# Patient Record
Sex: Female | Born: 1950
Health system: Southern US, Community
[De-identification: ages and names within clinical notes are randomized; demographics above are authoritative.]

## PROBLEM LIST (undated history)

## (undated) DIAGNOSIS — Z8249 Family history of ischemic heart disease and other diseases of the circulatory system: Secondary | ICD-10-CM

## (undated) DIAGNOSIS — I509 Heart failure, unspecified: Secondary | ICD-10-CM

## (undated) DIAGNOSIS — C801 Malignant (primary) neoplasm, unspecified: Secondary | ICD-10-CM

## (undated) DIAGNOSIS — G709 Myoneural disorder, unspecified: Secondary | ICD-10-CM

## (undated) DIAGNOSIS — I2510 Atherosclerotic heart disease of native coronary artery without angina pectoris: Secondary | ICD-10-CM

## (undated) DIAGNOSIS — E039 Hypothyroidism, unspecified: Secondary | ICD-10-CM

## (undated) DIAGNOSIS — R112 Nausea with vomiting, unspecified: Secondary | ICD-10-CM

## (undated) DIAGNOSIS — G20A1 Parkinson's disease without dyskinesia, without mention of fluctuations: Secondary | ICD-10-CM

## (undated) DIAGNOSIS — I10 Essential (primary) hypertension: Secondary | ICD-10-CM

## (undated) DIAGNOSIS — E785 Hyperlipidemia, unspecified: Secondary | ICD-10-CM

## (undated) DIAGNOSIS — Z9889 Other specified postprocedural states: Secondary | ICD-10-CM

## (undated) DIAGNOSIS — M1990 Unspecified osteoarthritis, unspecified site: Secondary | ICD-10-CM

## (undated) DIAGNOSIS — F419 Anxiety disorder, unspecified: Secondary | ICD-10-CM

## (undated) DIAGNOSIS — I219 Acute myocardial infarction, unspecified: Secondary | ICD-10-CM

## (undated) HISTORY — DX: Family history of ischemic heart disease and other diseases of the circulatory system: Z82.49

## (undated) HISTORY — DX: Hyperlipidemia, unspecified: E78.5

## (undated) HISTORY — PX: OTHER SURGICAL HISTORY: SHX169

## (undated) HISTORY — PX: ABDOMINAL HYSTERECTOMY: SHX81

## (undated) HISTORY — DX: Essential (primary) hypertension: I10

## (undated) HISTORY — PX: CATARACT EXTRACTION, BILATERAL: SHX1313

## (undated) HISTORY — DX: Atherosclerotic heart disease of native coronary artery without angina pectoris: I25.10

## (undated) HISTORY — PX: KNEE ARTHROSCOPY W/ DEBRIDEMENT: SHX1867

---

## 1998-08-24 ENCOUNTER — Other Ambulatory Visit: Admission: RE | Admit: 1998-08-24 | Discharge: 1998-08-24 | Payer: Self-pay | Admitting: Obstetrics and Gynecology

## 1999-08-31 ENCOUNTER — Other Ambulatory Visit: Admission: RE | Admit: 1999-08-31 | Discharge: 1999-08-31 | Payer: Self-pay | Admitting: Obstetrics and Gynecology

## 2003-07-29 ENCOUNTER — Ambulatory Visit (HOSPITAL_COMMUNITY): Admission: RE | Admit: 2003-07-29 | Discharge: 2003-07-29 | Payer: Self-pay | Admitting: Orthopedic Surgery

## 2004-10-17 ENCOUNTER — Encounter (INDEPENDENT_AMBULATORY_CARE_PROVIDER_SITE_OTHER): Payer: Self-pay | Admitting: *Deleted

## 2004-10-17 ENCOUNTER — Ambulatory Visit (HOSPITAL_COMMUNITY): Admission: RE | Admit: 2004-10-17 | Discharge: 2004-10-17 | Payer: Self-pay | Admitting: General Surgery

## 2004-10-17 ENCOUNTER — Ambulatory Visit (HOSPITAL_BASED_OUTPATIENT_CLINIC_OR_DEPARTMENT_OTHER): Admission: RE | Admit: 2004-10-17 | Discharge: 2004-10-17 | Payer: Self-pay | Admitting: General Surgery

## 2007-12-31 ENCOUNTER — Other Ambulatory Visit: Admission: RE | Admit: 2007-12-31 | Discharge: 2007-12-31 | Payer: Self-pay | Admitting: Family Medicine

## 2010-10-22 ENCOUNTER — Inpatient Hospital Stay (HOSPITAL_COMMUNITY)
Admission: EM | Admit: 2010-10-22 | Discharge: 2010-10-25 | Payer: Self-pay | Source: Home / Self Care | Attending: Cardiovascular Disease | Admitting: Cardiovascular Disease

## 2010-10-23 ENCOUNTER — Encounter (INDEPENDENT_AMBULATORY_CARE_PROVIDER_SITE_OTHER): Payer: Self-pay | Admitting: Cardiovascular Disease

## 2010-10-23 HISTORY — PX: TRANSTHORACIC ECHOCARDIOGRAM: SHX275

## 2010-10-24 HISTORY — PX: CORONARY ANGIOPLASTY WITH STENT PLACEMENT: SHX49

## 2010-10-25 LAB — CBC
HCT: 35 % — ABNORMAL LOW (ref 36.0–46.0)
Hemoglobin: 11.5 g/dL — ABNORMAL LOW (ref 12.0–15.0)
MCH: 28.6 pg (ref 26.0–34.0)
MCHC: 32.9 g/dL (ref 30.0–36.0)
MCV: 87.1 fL (ref 78.0–100.0)
Platelets: 287 10*3/uL (ref 150–400)
RBC: 4.02 MIL/uL (ref 3.87–5.11)
RDW: 12.6 % (ref 11.5–15.5)
WBC: 9 10*3/uL (ref 4.0–10.5)

## 2010-10-25 LAB — BASIC METABOLIC PANEL
BUN: 10 mg/dL (ref 6–23)
CO2: 24 mEq/L (ref 19–32)
Calcium: 8.6 mg/dL (ref 8.4–10.5)
Chloride: 112 mEq/L (ref 96–112)
Creatinine, Ser: 0.86 mg/dL (ref 0.4–1.2)
GFR calc Af Amer: >60 mL/min (ref >60)
GFR calc non Af Amer: >60 mL/min (ref >60)
Glucose, Bld: 104 mg/dL — ABNORMAL HIGH (ref 70–99)
Potassium: 4.1 mEq/L (ref 3.5–5.1)
Sodium: 140 mEq/L (ref 135–145)

## 2010-12-27 HISTORY — PX: NM MYOCAR PERF WALL MOTION: HXRAD629

## 2011-01-01 LAB — COMPREHENSIVE METABOLIC PANEL
ALT: 16 U/L (ref 0–35)
ALT: 17 U/L (ref 0–35)
AST: 26 U/L (ref 0–37)
AST: 26 U/L (ref 0–37)
Albumin: 3.9 g/dL (ref 3.5–5.2)
Albumin: 4.1 g/dL (ref 3.5–5.2)
Alkaline Phosphatase: 71 U/L (ref 39–117)
Alkaline Phosphatase: 76 U/L (ref 39–117)
BUN: 10 mg/dL (ref 6–23)
BUN: 15 mg/dL (ref 6–23)
CO2: 26 mEq/L (ref 19–32)
CO2: 27 mEq/L (ref 19–32)
Calcium: 10.5 mg/dL (ref 8.4–10.5)
Calcium: 9.2 mg/dL (ref 8.4–10.5)
Chloride: 102 mEq/L (ref 96–112)
Chloride: 104 mEq/L (ref 96–112)
Creatinine, Ser: 0.75 mg/dL (ref 0.4–1.2)
Creatinine, Ser: 0.86 mg/dL (ref 0.4–1.2)
GFR calc Af Amer: >60 mL/min (ref >60)
GFR calc Af Amer: >60 mL/min (ref >60)
GFR calc non Af Amer: >60 mL/min (ref >60)
GFR calc non Af Amer: >60 mL/min (ref >60)
Glucose, Bld: 119 mg/dL — ABNORMAL HIGH (ref 70–99)
Glucose, Bld: 121 mg/dL — ABNORMAL HIGH (ref 70–99)
Potassium: 4.1 mEq/L (ref 3.5–5.1)
Potassium: 4.1 mEq/L (ref 3.5–5.1)
Sodium: 138 mEq/L (ref 135–145)
Sodium: 139 mEq/L (ref 135–145)
Total Bilirubin: 0.5 mg/dL (ref 0.3–1.2)
Total Bilirubin: 0.7 mg/dL (ref 0.3–1.2)
Total Protein: 7.7 g/dL (ref 6.0–8.3)
Total Protein: 7.8 g/dL (ref 6.0–8.3)

## 2011-01-01 LAB — CK TOTAL AND CKMB (NOT AT ARMC)
CK, MB: 14.4 ng/mL (ref 0.3–4.0)
Relative Index: 9.1 — ABNORMAL HIGH (ref 0.0–2.5)
Total CK: 159 U/L (ref 7–177)

## 2011-01-01 LAB — CARDIAC PANEL(CRET KIN+CKTOT+MB+TROPI)
CK, MB: 19.3 ng/mL (ref 0.3–4.0)
Relative Index: 10.1 — ABNORMAL HIGH (ref 0.0–2.5)
Total CK: 192 U/L — ABNORMAL HIGH (ref 7–177)
Total CK: 196 U/L — ABNORMAL HIGH (ref 7–177)
Troponin I: 0.83 ng/mL (ref 0.00–0.06)

## 2011-01-01 LAB — POCT CARDIAC MARKERS
CKMB, poc: 2.8 ng/mL (ref 1.0–8.0)
CKMB, poc: 3.4 ng/mL (ref 1.0–8.0)
Myoglobin, poc: 104 ng/mL (ref 12–200)
Myoglobin, poc: 120 ng/mL (ref 12–200)
Troponin i, poc: 0.11 ng/mL — ABNORMAL HIGH (ref 0.00–0.09)
Troponin i, poc: 0.12 ng/mL — ABNORMAL HIGH (ref 0.00–0.09)

## 2011-01-01 LAB — TROPONIN I: Troponin I: 0.73 ng/mL (ref 0.00–0.06)

## 2011-01-01 LAB — CBC
HCT: 38.2 % (ref 36.0–46.0)
HCT: 40.1 % (ref 36.0–46.0)
HCT: 41.4 % (ref 36.0–46.0)
Hemoglobin: 12.9 g/dL (ref 12.0–15.0)
Hemoglobin: 13.5 g/dL (ref 12.0–15.0)
Hemoglobin: 14.1 g/dL (ref 12.0–15.0)
MCH: 29.3 pg (ref 26.0–34.0)
MCH: 29.5 pg (ref 26.0–34.0)
MCH: 29.5 pg (ref 26.0–34.0)
MCHC: 33.7 g/dL (ref 30.0–36.0)
MCHC: 33.8 g/dL (ref 30.0–36.0)
MCHC: 34.1 g/dL (ref 30.0–36.0)
MCV: 86.6 fL (ref 78.0–100.0)
MCV: 87 fL (ref 78.0–100.0)
Platelets: 388 10*3/uL (ref 150–400)
Platelets: 395 10*3/uL (ref 150–400)
RBC: 4.61 MIL/uL (ref 3.87–5.11)
RBC: 4.78 MIL/uL (ref 3.87–5.11)
RDW: 12.7 % (ref 11.5–15.5)
RDW: 12.7 % (ref 11.5–15.5)
WBC: 7.9 10*3/uL (ref 4.0–10.5)
WBC: 8.9 10*3/uL (ref 4.0–10.5)

## 2011-01-01 LAB — HEMOGLOBIN A1C
Hgb A1c MFr Bld: 5.5 % (ref <5.7)
Mean Plasma Glucose: 111 mg/dL (ref <117)

## 2011-01-01 LAB — BASIC METABOLIC PANEL
CO2: 25 mEq/L (ref 19–32)
Chloride: 107 mEq/L (ref 96–112)
Glucose, Bld: 114 mg/dL — ABNORMAL HIGH (ref 70–99)
Potassium: 4.2 mEq/L (ref 3.5–5.1)
Sodium: 138 mEq/L (ref 135–145)

## 2011-01-01 LAB — MAGNESIUM: Magnesium: 2.2 mg/dL (ref 1.5–2.5)

## 2011-01-01 LAB — LIPID PANEL
Cholesterol: 199 mg/dL (ref 0–200)
HDL: 45 mg/dL (ref >39)
LDL Cholesterol: 108 mg/dL — ABNORMAL HIGH (ref 0–99)
Total CHOL/HDL Ratio: 4.4 RATIO
Triglycerides: 230 mg/dL — ABNORMAL HIGH (ref <150)
VLDL: 46 mg/dL — ABNORMAL HIGH (ref 0–40)

## 2011-01-01 LAB — HEPARIN LEVEL (UNFRACTIONATED)
Heparin Unfractionated: 0.18 IU/mL — ABNORMAL LOW (ref 0.30–0.70)
Heparin Unfractionated: 0.38 IU/mL (ref 0.30–0.70)

## 2011-01-01 LAB — PROTIME-INR
INR: 1.06 (ref 0.00–1.49)
Prothrombin Time: 14 seconds (ref 11.6–15.2)

## 2011-01-01 LAB — TSH: TSH: 2.765 u[IU]/mL (ref 0.350–4.500)

## 2011-01-01 LAB — GLUCOSE, CAPILLARY: Glucose-Capillary: 152 mg/dL — ABNORMAL HIGH (ref 70–99)

## 2011-01-01 LAB — MRSA PCR SCREENING: MRSA by PCR: NEGATIVE

## 2011-01-01 LAB — BRAIN NATRIURETIC PEPTIDE: Pro B Natriuretic peptide (BNP): <30 pg/mL (ref 0.0–100.0)

## 2011-01-01 LAB — APTT: aPTT: 40 seconds — ABNORMAL HIGH (ref 24–37)

## 2011-03-09 NOTE — Op Note (Signed)
NAME:  [name], Julie Carey NO.:  [id number]   MEDICAL RECORD NO.:  [id number]          PATIENT TYPE:  AMB   LOCATION:  DSC                          FACILITY:  [facility]   PHYSICIAN:  Rose Phi. Maple Hudson, M.D.   DATE OF BIRTH:  Oct 30, 1950   DATE OF PROCEDURE:  10/17/2004  DATE OF DISCHARGE:                                 OPERATIVE REPORT   PREOPERATIVE DIAGNOSIS:  T1a melanoma of the left lower calf.   POSTOPERATIVE DIAGNOSIS:  T1a melanoma of the left lower calf.   OPERATION/PROCEDURE:  Excision with primary closure.   SURGEON:  Dr. Francina Ames.   ANESTHESIA:  Local.   OPERATIVE PROCEDURE:  The patient was placed in the prone position and the  left lower calf prepped and draped in the usual fashion.  The lesion was  just above the Achilles tendon.  I marked an elliptical incision,  longitudinally oriented, with a margin of 5 or 6 mm on either side of the  biopsy site.  The area was then infiltrated with 1% Xylocaine with  Adrenalin.  The incision was made and the area excised.  We had good  hemostasis.   This left a defect of 4 x 2.5 cm.   It was then closed in a single layer of interrupted nylon sutures.   Dressing applied and the patient transferred back to the recovery area and  then allowed to go home.      Pete   PRY/MEDQ  D:  10/17/2004  T:  10/17/2004  Job:  [id number]

## 2011-03-09 NOTE — Op Note (Signed)
NAME:  Julie Carey, Julie Carey                           ACCOUNT NO.:  [id number]   MEDICAL RECORD NO.:  [id number]                   PATIENT TYPE:  AMB   LOCATION:  DAY                                  FACILITY:  [facility]   PHYSICIAN:  Marlowe Kays, M.D.               DATE OF BIRTH:  25-Nov-1950   DATE OF PROCEDURE:  07/29/2003  DATE OF DISCHARGE:                                 OPERATIVE REPORT   PREOPERATIVE DIAGNOSIS:  Torn medial meniscus, right knee.   POSTOPERATIVE DIAGNOSES:  1. Torn medial meniscus.  2. 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  3. Extensive synovitis lateral compartment of the knee joint.  4. Two suprapatellar plica associated with chondromalacia grade 2/4 of the     patella.   OPERATION:  Right knee arthroscopy with 1) partial medial meniscectomy, 2)  shaving of the medial femoral condyle, 3) debridement of lateral compartment  of the knee joint, 4) excision of two suprapatellar plica and shaving of the  patella.   SURGEON:  Marlowe Kays, M.D.   ASSISTANT:  Nurse.   ANESTHESIA:  General.   PATHOLOGY AND JUSTIFICATION FOR PROCEDURE:  She has had a long standing  problem with pain in her right knee. Recent MRIs demonstrated the tear of  the posterior horn and body of the medial meniscus and consequently she is  here today for the above mentioned surgery.   DESCRIPTION OF PROCEDURE:  Satisfactory general anesthesia, pneumatic  tourniquet, thigh stabilizer, right knee was prepped with Duraprep, draped  in a sterile field. Superomedial saline inflow. First through an anterior  medial portal, the lateral compartment of the knee joint was evaluated. She  had a rather striking amount of synovitis present as well as several long  pedunculated lesions from the lateral ACL which were potentially an  entrapment problem. I resected the synovium with a 3.5 shaver and resected  the pedunculated lesions with a variety of baskets. The lateral meniscus was  a  little frayed but otherwise was intact. Her ACL was intact. I then looked  up in the lateral gutter and the suprapatellar area and found a large plica  at 10 o'clock and an even larger one at 2 o'clock. Most of this was  sectioned with scissors from this approach but I was unable to really remove  the remnants. I then reversed portals and through this approach was able to  remove the remnants of the plica and shape down the mid portion of the  patella which had grade 2/4 chondromalacia. I then looked in the medial  compartment of the knee joint, there was some moderate synovitis present  which I resected. She had some grade 2/4 chondromalacia over a good bit of  the weightbearing surface of the medial femoral condyle which I gently  debrided back with baskets and the 3.5 shaver. A tear perpendicular to the  meniscus was noted at the posterior  curve and in the intercondylar area. I  began resecting this with baskets and a 3.5 shaver and found that the whole  posterior portion of meniscus was unstable and could be brought into the  joint necessitating resection of this back to a stable rim with baskets and  shaver as well. Final pictures were taken. The knee joint was then irrigated  until clear and all fluid possible removed. The two anterior portals were  closed with 4-0 nylon, 20 mL of 0.5% Marcaine with adrenaline and 4 mg of  morphine were  then instilled through the inflap rasp which was removed and this portal  closed with 4-0 nylon as well. Betadine adaptic dry sterile dressing were  applied, tourniquet was released. She tolerated the procedure well and was  taken to the recovery room in satisfactory condition with no known  complications.                                               Marlowe Kays, M.D.    JA/MEDQ  D:  07/29/2003  T:  07/29/2003  Job:  [id number]

## 2013-03-30 ENCOUNTER — Other Ambulatory Visit: Payer: Self-pay | Admitting: *Deleted

## 2013-03-30 MED ORDER — PANTOPRAZOLE SODIUM 40 MG PO TBEC: 40.0000 mg | DELAYED_RELEASE_TABLET | Freq: Every day | ORAL | Status: DC

## 2013-03-31 ENCOUNTER — Other Ambulatory Visit: Payer: Self-pay | Admitting: *Deleted

## 2013-03-31 MED ORDER — PANTOPRAZOLE SODIUM 40 MG PO TBEC: 40.0000 mg | DELAYED_RELEASE_TABLET | Freq: Every day | ORAL | Status: DC

## 2013-04-06 ENCOUNTER — Other Ambulatory Visit (HOSPITAL_COMMUNITY): Payer: Self-pay | Admitting: Cardiovascular Disease

## 2013-05-28 ENCOUNTER — Other Ambulatory Visit: Payer: Self-pay | Admitting: Cardiology

## 2013-05-29 NOTE — Telephone Encounter (Signed)
Rx was sent to pharmacy electronically. 

## 2013-05-31 ENCOUNTER — Other Ambulatory Visit (HOSPITAL_COMMUNITY): Payer: Self-pay | Admitting: Cardiovascular Disease

## 2013-06-01 NOTE — Telephone Encounter (Signed)
Rx was sent to pharmacy electronically. 

## 2013-06-04 ENCOUNTER — Telehealth: Payer: Self-pay

## 2013-06-04 MED ORDER — LISINOPRIL 10 MG PO TABS: 10.0000 mg | ORAL_TABLET | Freq: Every day | ORAL | Status: DC

## 2013-06-04 NOTE — Telephone Encounter (Signed)
Received a prescribing error from pharmacy.  Called pt to inquire about Lisinopril dosage. JB increased Lisinopril from 5 mg to 10 mg in 08/2012.  Tried calling the pharmacy about the error twice, unable to speak with a pharmacist.  Rx was sent to pharmacy electronically, again.

## 2013-07-17 ENCOUNTER — Other Ambulatory Visit (HOSPITAL_COMMUNITY): Payer: Self-pay | Admitting: Cardiovascular Disease

## 2013-07-17 NOTE — Telephone Encounter (Signed)
Rx was sent to pharmacy electronically. 

## 2013-08-31 ENCOUNTER — Other Ambulatory Visit (HOSPITAL_COMMUNITY): Payer: Self-pay | Admitting: Cardiovascular Disease

## 2013-08-31 NOTE — Telephone Encounter (Signed)
Rx was sent to pharmacy electronically. 

## 2013-12-01 ENCOUNTER — Other Ambulatory Visit: Payer: Self-pay | Admitting: Cardiovascular Disease

## 2013-12-22 ENCOUNTER — Other Ambulatory Visit (HOSPITAL_COMMUNITY): Payer: Self-pay | Admitting: Cardiovascular Disease

## 2013-12-24 NOTE — Telephone Encounter (Signed)
,  Rx was sent to pharmacy electronically.

## 2013-12-24 NOTE — Telephone Encounter (Signed)
Pt calling again ,have not gotten her Simvastatin.

## 2014-01-18 ENCOUNTER — Other Ambulatory Visit (HOSPITAL_COMMUNITY): Payer: Self-pay | Admitting: Cardiovascular Disease

## 2014-01-20 NOTE — Telephone Encounter (Signed)
Rx was sent to pharmacy electronically. 

## 2014-01-20 NOTE — Telephone Encounter (Signed)
Pt was calling in regards to her Simvastatin needing to be refilled. Her next appointment is Aprl 29th with Dr. Gwenlyn Found. She stated that the pharmacy told her to call us to make an appointment because she has no more refills.  JB

## 2014-02-09 ENCOUNTER — Encounter: Payer: Self-pay | Admitting: *Deleted

## 2014-02-16 ENCOUNTER — Encounter: Payer: Self-pay | Admitting: Cardiovascular Disease

## 2014-02-17 ENCOUNTER — Encounter: Payer: Self-pay | Admitting: Cardiovascular Disease

## 2014-02-17 ENCOUNTER — Ambulatory Visit (INDEPENDENT_AMBULATORY_CARE_PROVIDER_SITE_OTHER): Payer: BC Managed Care – PPO | Admitting: Cardiovascular Disease

## 2014-02-17 VITALS — BP 120/78 | HR 75 | Ht 64.0 in | Wt 198.0 lb

## 2014-02-17 DIAGNOSIS — E785 Hyperlipidemia, unspecified: Secondary | ICD-10-CM

## 2014-02-17 DIAGNOSIS — E782 Mixed hyperlipidemia: Secondary | ICD-10-CM

## 2014-02-17 DIAGNOSIS — Z79899 Other long term (current) drug therapy: Secondary | ICD-10-CM

## 2014-02-17 DIAGNOSIS — I2510 Atherosclerotic heart disease of native coronary artery without angina pectoris: Secondary | ICD-10-CM

## 2014-02-17 DIAGNOSIS — I10 Essential (primary) hypertension: Secondary | ICD-10-CM | POA: Insufficient documentation

## 2014-02-17 NOTE — Assessment & Plan Note (Signed)
Controlled on current medications 

## 2014-02-17 NOTE — Progress Notes (Signed)
02/17/2014 Dede Query   1951/08/30  [id number]  Primary Physician Reginia Naas, MD Primary Cardiologist: Lorretta Harp MD [name]   HPI:  The patient returns today for 37-month followup. She is a 63 year old, mildly overweight, married Caucasian female, mother of 10, grandmother to 5 grandchildren who I saw August 29, 2012. She is 2 years status post non-STEMI and ultimate stenting of her AV groove circumflex by Dr. Ellouise Newer with a drug-eluting stent. She had moderate but noncritical disease in her LAD and RCA. Her risk factors include hyperlipidemia and family history. She had a negative Myoview December 27, 2010. She complains of labile hypertension and some palpitations; however, her blood pressure log over the last several months, after I increased her lisinopril, showed better control of her blood pressure. She did not increase her beta blocker as I had prescribed. Her lipid profile was excellent for secondary prevention and this will be rechecked. Since I saw her a year ago she denies chest pain or shortness of breath.     Current Outpatient Prescriptions  Medication Sig Dispense Refill  . aspirin 81 MG tablet Take 81 mg by mouth daily.      . clopidogrel (PLAVIX) 75 MG tablet TAKE 1 TABLET BY MOUTH EVERY DAY WITH A MEAL  30 tablet  3  . estrogens, conjugated, (PREMARIN) 0.625 MG tablet Take 0.625 mg by mouth daily. Take daily for 21 days then do not take for 7 days.      Marland Kitchen levothyroxine (SYNTHROID, LEVOTHROID) 75 MCG tablet Take 75 mcg by mouth daily before breakfast.      . lisinopril (PRINIVIL,ZESTRIL) 10 MG tablet TAKE 1 TABLET BY MOUTH DAILY  30 tablet  3  . metoprolol tartrate (LOPRESSOR) 25 MG tablet TAKE 1 TABLET BY MOUTH TWICE DAILY  60 tablet  3  . pantoprazole (PROTONIX) 40 MG tablet Take 1 tablet (40 mg total) by mouth daily.  30 tablet  11  . simvastatin (ZOCOR) 40 MG tablet TAKE 1 TABLET BY MOUTH DAILY  30 tablet  0   No current  facility-administered medications for this visit.    No Known Allergies  History   Social History  . Marital Status: Married    Spouse Name: N/A    Number of Children: N/A  . Years of Education: N/A   Occupational History  . Not on file.   Social History Main Topics  . Smoking status: Never Smoker   . Smokeless tobacco: Not on file  . Alcohol Use: Not on file  . Drug Use: Not on file  . Sexual Activity: Not on file   Other Topics Concern  . Not on file   Social History Narrative  . No narrative on file     Review of Systems: General: negative for chills, fever, night sweats or weight changes.  Cardiovascular: negative for chest pain, dyspnea on exertion, edema, orthopnea, palpitations, paroxysmal nocturnal dyspnea or shortness of breath Dermatological: negative for rash Respiratory: negative for cough or wheezing Urologic: negative for hematuria Abdominal: negative for nausea, vomiting, diarrhea, bright red blood per rectum, melena, or hematemesis Neurologic: negative for visual changes, syncope, or dizziness All other systems reviewed and are otherwise negative except as noted above.    Blood pressure 120/78, pulse 75, height 5\' 4"  (1.626 m), weight 198 lb (89.812 kg).  General appearance: alert and no distress Neck: no adenopathy, no carotid bruit, no JVD, supple, symmetrical, trachea midline and thyroid not enlarged, symmetric, no tenderness/mass/nodules  Lungs: clear to auscultation bilaterally Heart: regular rate and rhythm, S1, S2 normal, no murmur, click, rub or gallop Extremities: extremities normal, atraumatic, no cyanosis or edema  EKG normal sinus rhythm at 75 with a rightward axis  ASSESSMENT AND PLAN:   Hyperlipidemia On statin therapy. We will recheck a lipid and liver profile  Essential hypertension Controlled on current medications  Coronary artery disease History of non-STEMI status post circumflex intervention by Dr. Ellouise Newer 10/24/10 with a  Ms. Almond drug-eluting stent. Her LAD in RCA had moderate but not critical disease. Her EF was normal. She denies chest pain or shortness of breath. Myoview stress test performed a/7/12 was nonischemic.      Lorretta Harp MD FACP,FACC,FAHA, [facility] 02/17/2014 10:43 AM

## 2014-02-17 NOTE — Assessment & Plan Note (Signed)
On statin therapy. We will recheck a lipid and liver profile 

## 2014-02-17 NOTE — Assessment & Plan Note (Signed)
History of non-STEMI status post circumflex intervention by Dr. Ellouise Newer 10/24/10 with a Ms. Almond drug-eluting stent. Her LAD in RCA had moderate but not critical disease. Her EF was normal. She denies chest pain or shortness of breath. Myoview stress test performed a/7/12 was nonischemic.

## 2014-02-17 NOTE — Patient Instructions (Signed)
Your physician recommends that you schedule a follow-up appointment in: 1 year  Your physician recommends that you return for lab work in: Fasting Lipids Liver      

## 2014-02-26 ENCOUNTER — Other Ambulatory Visit (HOSPITAL_COMMUNITY): Payer: Self-pay | Admitting: Cardiovascular Disease

## 2014-02-26 NOTE — Telephone Encounter (Signed)
Rx refill sent to patient pharmacy   

## 2014-03-19 LAB — HEPATIC FUNCTION PANEL
ALBUMIN: 4.1 g/dL (ref 3.5–5.2)
ALK PHOS: 61 U/L (ref 39–117)
ALT: 14 U/L (ref 0–35)
AST: 17 U/L (ref 0–37)
BILIRUBIN TOTAL: 0.4 mg/dL (ref 0.2–1.2)
Bilirubin, Direct: 0.1 mg/dL (ref 0.0–0.3)
Indirect Bilirubin: 0.3 mg/dL (ref 0.2–1.2)
TOTAL PROTEIN: 6.6 g/dL (ref 6.0–8.3)

## 2014-03-19 LAB — LIPID PANEL
CHOLESTEROL: 141 mg/dL (ref 0–200)
HDL: 49 mg/dL (ref >39)
LDL Cholesterol: 63 mg/dL (ref 0–99)
Total CHOL/HDL Ratio: 2.9 Ratio
Triglycerides: 146 mg/dL (ref <150)
VLDL: 29 mg/dL (ref 0–40)

## 2014-03-23 ENCOUNTER — Encounter: Payer: Self-pay | Admitting: *Deleted

## 2014-04-02 ENCOUNTER — Other Ambulatory Visit: Payer: Self-pay | Admitting: Cardiovascular Disease

## 2014-04-02 ENCOUNTER — Other Ambulatory Visit: Payer: Self-pay | Admitting: *Deleted

## 2014-04-05 NOTE — Telephone Encounter (Signed)
Rx refill sent to patient pharmacy   

## 2014-05-04 ENCOUNTER — Other Ambulatory Visit: Payer: Self-pay | Admitting: Cardiovascular Disease

## 2014-05-13 ENCOUNTER — Telehealth: Payer: Self-pay | Admitting: Cardiovascular Disease

## 2014-05-13 NOTE — Telephone Encounter (Signed)
Julie Carey is calling because she wants to try samples of Instaflex. But needs to check with her MD before trying it . Want to try it for the advance joint support .Marland Kitchen Please call    Thanks

## 2014-05-13 NOTE — Telephone Encounter (Signed)
Returned call to patient she stated she wanted to ask Dr.Berry if ok to take Instaflex for joint support.Dr.Berry out of office will send message to him for advice.

## 2014-05-14 NOTE — Telephone Encounter (Signed)
Spoke with patient after reviewing med list (see Roanna Banning).  She is fine to try Instaflex.  She also asked about grapefruit, but because she is on a statin, she does need to avoid that.  Pt voiced understanding.

## 2014-05-14 NOTE — Telephone Encounter (Signed)
This patient goes by Baker Janus.  Her MRN is [id number]

## 2014-05-14 NOTE — Telephone Encounter (Signed)
Julie Carey, can you make sure that this is okay for patient to take? Thx!

## 2014-10-02 ENCOUNTER — Other Ambulatory Visit: Payer: Self-pay | Admitting: Cardiovascular Disease

## 2014-10-04 NOTE — Telephone Encounter (Signed)
Rx was sent to pharmacy electronically. 

## 2014-10-30 ENCOUNTER — Other Ambulatory Visit: Payer: Self-pay | Admitting: Cardiovascular Disease

## 2014-11-01 NOTE — Telephone Encounter (Signed)
Rx refill sent to patient pharmacy   

## 2015-01-01 ENCOUNTER — Other Ambulatory Visit: Payer: Self-pay | Admitting: Cardiovascular Disease

## 2015-01-03 NOTE — Telephone Encounter (Signed)
Rx refill sent to patient pharmacy   

## 2015-02-04 ENCOUNTER — Other Ambulatory Visit: Payer: Self-pay | Admitting: Cardiovascular Disease

## 2015-02-04 NOTE — Telephone Encounter (Signed)
Rx(s) sent to pharmacy electronically.  

## 2015-02-07 ENCOUNTER — Telehealth: Payer: Self-pay | Admitting: Cardiovascular Disease

## 2015-02-08 NOTE — Telephone Encounter (Signed)
Closed encounter °

## 2015-02-24 ENCOUNTER — Other Ambulatory Visit: Payer: Self-pay | Admitting: Cardiovascular Disease

## 2015-02-24 ENCOUNTER — Other Ambulatory Visit: Payer: Self-pay | Admitting: *Deleted

## 2015-02-24 MED ORDER — METOPROLOL TARTRATE 25 MG PO TABS: 25.0000 mg | ORAL_TABLET | Freq: Two times a day (BID) | ORAL | Status: DC

## 2015-02-24 MED ORDER — PANTOPRAZOLE SODIUM 40 MG PO TBEC: 40.0000 mg | DELAYED_RELEASE_TABLET | Freq: Every day | ORAL | Status: DC

## 2015-03-03 ENCOUNTER — Other Ambulatory Visit: Payer: Self-pay | Admitting: *Deleted

## 2015-03-03 ENCOUNTER — Other Ambulatory Visit: Payer: Self-pay | Admitting: Cardiovascular Disease

## 2015-03-04 NOTE — Telephone Encounter (Signed)
Rx(s) sent to pharmacy electronically.  

## 2015-03-21 ENCOUNTER — Other Ambulatory Visit: Payer: Self-pay | Admitting: Cardiovascular Disease

## 2015-03-22 NOTE — Telephone Encounter (Signed)
Rx(s) sent to pharmacy electronically.  

## 2015-04-04 ENCOUNTER — Other Ambulatory Visit: Payer: Self-pay | Admitting: Cardiovascular Disease

## 2015-04-20 ENCOUNTER — Other Ambulatory Visit: Payer: Self-pay | Admitting: Cardiovascular Disease

## 2015-04-21 NOTE — Telephone Encounter (Signed)
REFILL 

## 2015-04-26 ENCOUNTER — Ambulatory Visit (INDEPENDENT_AMBULATORY_CARE_PROVIDER_SITE_OTHER): Payer: BLUE CROSS/BLUE SHIELD | Admitting: Cardiovascular Disease

## 2015-04-26 ENCOUNTER — Encounter: Payer: Self-pay | Admitting: Cardiovascular Disease

## 2015-04-26 VITALS — BP 110/72 | HR 70 | Ht 64.0 in | Wt 202.4 lb

## 2015-04-26 DIAGNOSIS — E785 Hyperlipidemia, unspecified: Secondary | ICD-10-CM

## 2015-04-26 DIAGNOSIS — I2510 Atherosclerotic heart disease of native coronary artery without angina pectoris: Secondary | ICD-10-CM | POA: Diagnosis not present

## 2015-04-26 DIAGNOSIS — Z79899 Other long term (current) drug therapy: Secondary | ICD-10-CM | POA: Diagnosis not present

## 2015-04-26 DIAGNOSIS — I10 Essential (primary) hypertension: Secondary | ICD-10-CM

## 2015-04-26 MED ORDER — CLOPIDOGREL BISULFATE 75 MG PO TABS: 75.0000 mg | ORAL_TABLET | Freq: Every day | ORAL | Status: DC

## 2015-04-26 MED ORDER — LEVOTHYROXINE SODIUM 75 MCG PO TABS: 75.0000 ug | ORAL_TABLET | Freq: Every day | ORAL | Status: DC

## 2015-04-26 MED ORDER — SIMVASTATIN 40 MG PO TABS: 40.0000 mg | ORAL_TABLET | Freq: Every day | ORAL | Status: DC

## 2015-04-26 MED ORDER — LISINOPRIL 10 MG PO TABS: 10.0000 mg | ORAL_TABLET | Freq: Every day | ORAL | Status: DC

## 2015-04-26 MED ORDER — METOPROLOL TARTRATE 25 MG PO TABS: 25.0000 mg | ORAL_TABLET | Freq: Two times a day (BID) | ORAL | Status: DC

## 2015-04-26 MED ORDER — PANTOPRAZOLE SODIUM 40 MG PO TBEC: 40.0000 mg | DELAYED_RELEASE_TABLET | Freq: Every day | ORAL | Status: DC

## 2015-04-26 NOTE — Assessment & Plan Note (Signed)
History of coronary artery disease status post non-STEMI with AV groove circumflex stenting by Dr. Claiborne Billings in 2012. She had moderate but noncritical disease in her LAD and RCA. She did have a Myoview performed 12/27/10 which was nonischemic. She denies chest pain or shortness of breath.

## 2015-04-26 NOTE — Progress Notes (Signed)
04/26/2015 Julie Carey   Sep 06, 1951  [id number]  Primary Physician Julie Naas, MD Primary Cardiologist: Lorretta Harp MD Julie Carey   HPI:  The patient returns today for 1 year followup. She is a 64 year old, mildly overweight, married Caucasian female, mother of 59, grandmother to 5 grandchildren who I saw 02/17/14.. She is 2 years status post non-STEMI and ultimate stenting of her AV groove circumflex by Dr. Ellouise Newer with a drug-eluting stent. She had moderate but noncritical disease in her LAD and RCA. Her risk factors include hyperlipidemia and family history. She had a negative Myoview December 27, 2010. She complains of labile hypertension and some palpitations; however, her blood pressure log over the last several months, after I increased her lisinopril, showed better control of her blood pressure. She did not increase her beta blocker as I had prescribed. Her lipid profile was excellent for secondary prevention and this will be rechecked. Since I saw her a year ago she denies chest pain or shortness of breath.   Current Outpatient Prescriptions  Medication Sig Dispense Refill  . aspirin 81 MG tablet Take 81 mg by mouth 2 (two) times daily.     . clopidogrel (PLAVIX) 75 MG tablet Take 1 tablet (75 mg total) by mouth daily. 90 tablet 3  . levothyroxine (SYNTHROID, LEVOTHROID) 75 MCG tablet Take 1 tablet (75 mcg total) by mouth daily before breakfast. 90 tablet 3  . lisinopril (PRINIVIL,ZESTRIL) 10 MG tablet Take 1 tablet (10 mg total) by mouth daily. 90 tablet 3  . metoprolol tartrate (LOPRESSOR) 25 MG tablet Take 1 tablet (25 mg total) by mouth 2 (two) times daily. 180 tablet 3  . pantoprazole (PROTONIX) 40 MG tablet Take 1 tablet (40 mg total) by mouth daily. 90 tablet 3  . simvastatin (ZOCOR) 40 MG tablet Take 1 tablet (40 mg total) by mouth daily. 90 tablet 3   No current facility-administered medications for this visit.    No Known Allergies  History     Social History  . Marital Status: Married    Spouse Name: N/A  . Number of Children: N/A  . Years of Education: N/A   Occupational History  . Not on file.   Social History Main Topics  . Smoking status: Never Smoker   . Smokeless tobacco: Not on file  . Alcohol Use: Not on file  . Drug Use: Not on file  . Sexual Activity: Not on file   Other Topics Concern  . Not on file   Social History Narrative     Review of Systems: General: negative for chills, fever, night sweats or weight changes.  Cardiovascular: negative for chest pain, dyspnea on exertion, edema, orthopnea, palpitations, paroxysmal nocturnal dyspnea or shortness of breath Dermatological: negative for rash Respiratory: negative for cough or wheezing Urologic: negative for hematuria Abdominal: negative for nausea, vomiting, diarrhea, bright red blood per rectum, melena, or hematemesis Neurologic: negative for visual changes, syncope, or dizziness All other systems reviewed and are otherwise negative except as noted above.    Blood pressure 110/72, pulse 70, height 5\' 4"  (1.626 m), weight 202 lb 6.4 oz (91.808 kg).  General appearance: alert and no distress Neck: no adenopathy, no carotid bruit, no JVD, supple, symmetrical, trachea midline and thyroid not enlarged, symmetric, no tenderness/mass/nodules Lungs: clear to auscultation bilaterally Heart: regular rate and rhythm, S1, S2 normal, no murmur, click, rub or gallop Extremities: extremities normal, atraumatic, no cyanosis or edema  EKG normal sinus rhythm at  70 without ST or T-wave changes. I personally reviewed this EKG  ASSESSMENT AND PLAN:   Hyperlipidemia History of hyperlipidemia on simvastatin 40 mg a day. We'll recheck a lipid and liver profile  Essential hypertension History of hypertension blood pressure measurement today 110/72. She is on metoprolol and lisinopril. Continue current meds at current dosing  Coronary artery disease History of  coronary artery disease status post non-STEMI with AV groove circumflex stenting by Dr. Claiborne Billings in 2012. She had moderate but noncritical disease in her LAD and RCA. She did have a Myoview performed 12/27/10 which was nonischemic. She denies chest pain or shortness of breath.      Lorretta Harp MD FACP,FACC,FAHA, [facility] 04/26/2015 11:38 AM

## 2015-04-26 NOTE — Patient Instructions (Signed)
Your physician recommends that you return for lab work at your earliest convenience - FASTING.  Dr Berry recommends that you schedule a follow-up appointment in 1 year. You will receive a reminder letter in the mail two months in advance. If you don't receive a letter, please call our office to schedule the follow-up appointment. 

## 2015-04-26 NOTE — Assessment & Plan Note (Signed)
History of hypertension blood pressure measurement today 110/72. She is on metoprolol and lisinopril. Continue current meds at current dosing

## 2015-04-26 NOTE — Assessment & Plan Note (Signed)
History of hyperlipidemia on simvastatin 40 mg a day. We'll recheck a lipid and liver profile

## 2015-05-13 LAB — HEPATIC FUNCTION PANEL
ALK PHOS: 75 U/L (ref 39–117)
ALT: 14 U/L (ref 0–35)
AST: 17 U/L (ref 0–37)
Albumin: 4.5 g/dL (ref 3.5–5.2)
BILIRUBIN DIRECT: 0.1 mg/dL (ref 0.0–0.3)
BILIRUBIN INDIRECT: 0.4 mg/dL (ref 0.2–1.2)
TOTAL PROTEIN: 7.2 g/dL (ref 6.0–8.3)
Total Bilirubin: 0.5 mg/dL (ref 0.2–1.2)

## 2015-05-13 LAB — LIPID PANEL
CHOL/HDL RATIO: 3.4 ratio
Cholesterol: 162 mg/dL (ref 0–200)
HDL: 47 mg/dL (ref >=46)
LDL Cholesterol: 81 mg/dL (ref 0–99)
TRIGLYCERIDES: 169 mg/dL — AB (ref <150)
VLDL: 34 mg/dL (ref 0–40)

## 2015-05-17 ENCOUNTER — Encounter: Payer: Self-pay | Admitting: Podiatry

## 2015-05-17 ENCOUNTER — Ambulatory Visit (INDEPENDENT_AMBULATORY_CARE_PROVIDER_SITE_OTHER): Payer: BLUE CROSS/BLUE SHIELD

## 2015-05-17 ENCOUNTER — Ambulatory Visit (INDEPENDENT_AMBULATORY_CARE_PROVIDER_SITE_OTHER): Payer: BLUE CROSS/BLUE SHIELD | Admitting: Podiatry

## 2015-05-17 VITALS — BP 124/70 | HR 76 | Resp 16

## 2015-05-17 DIAGNOSIS — M79672 Pain in left foot: Secondary | ICD-10-CM

## 2015-05-17 DIAGNOSIS — M7662 Achilles tendinitis, left leg: Secondary | ICD-10-CM | POA: Diagnosis not present

## 2015-05-17 MED ORDER — METHYLPREDNISOLONE 4 MG PO TBPK: ORAL_TABLET | ORAL | Status: DC

## 2015-05-17 NOTE — Patient Instructions (Signed)

## 2015-05-17 NOTE — Progress Notes (Signed)
   Subjective:    Patient ID: Julie Carey, female    DOB: 1951-04-10, 64 y.o.   MRN: [id number]  HPI Comments: "I have pain in the heel"  Patient c/o sharp posterior heel left for couple months. There is a knot and some swelling. No AM pain. Certain shoes rub the area. Tried Advil-some relief.  Foot Pain Associated symptoms include diaphoresis, fatigue and myalgias.      Review of Systems  Constitutional: Positive for diaphoresis and fatigue.  Musculoskeletal: Positive for myalgias.  Hematological: Bruises/bleeds easily.  All other systems reviewed and are negative.      Objective:   Physical Exam: She presents today with a chief complaint of painful heel pain to the left heel times the past 2 months. She denies trauma to the heel.  Objective: Vital signs are stable she is alert and oriented 3. Pulses are strongly palpable bilateral. Deep tendon reflexes are intact bilateral. Neurologic sensorium is intact bilateral. Muscle strength plus dorsiflexors and plantar flexors and inverters everters all intrinsic musculature is intact. Orthopedic evaluation demonstrates pain on palpation of the Achilles tendon of the left heel. Small bursa beneath the subcutaneous area and the pre-bursal region. Radiographs confirm a retrocalcaneal heel spur was severe thickening of the tendo Achilles at its insertion site. Cutaneous evaluation Mr. supple well-hydrated cutis no erythema edema saline as drainage or odor.        Assessment & Plan:  Assessment: Insertional Achilles tendinitis with bursitis and retrocalcaneal heel spur left.  Plan: Discussed etiology pathology conservative versus surgical therapies. I injected a small amount of dexamethasone to the bursal sac 2 mg was injected of dexamethasone. I placed her on a Medrol Dosepak. And I also placed her in a Cam Walker. We discussed ice therapy sugar modifications. Follow up with her in 1 month.

## 2015-05-18 ENCOUNTER — Encounter: Payer: Self-pay | Admitting: *Deleted

## 2015-06-14 ENCOUNTER — Ambulatory Visit (INDEPENDENT_AMBULATORY_CARE_PROVIDER_SITE_OTHER): Payer: BLUE CROSS/BLUE SHIELD | Admitting: Podiatry

## 2015-06-14 ENCOUNTER — Encounter: Payer: Self-pay | Admitting: Podiatry

## 2015-06-14 VITALS — BP 123/71 | HR 72 | Resp 16

## 2015-06-14 DIAGNOSIS — M7662 Achilles tendinitis, left leg: Secondary | ICD-10-CM | POA: Diagnosis not present

## 2015-06-14 MED ORDER — MELOXICAM 15 MG PO TABS: 15.0000 mg | ORAL_TABLET | Freq: Every day | ORAL | Status: DC

## 2015-06-14 NOTE — Progress Notes (Signed)
She presents today for follow-up of her Achilles tendinitis left heel. She states it is approximately 80% improved. She relates going to the beach walking in sandals with no problem.  Objective: Vital signs are stable alert and oriented 3. Pulses are strongly palpable. Neurologic sensorium is intact. Muscle strength +5 over 5 dorsiflexion plantar flexors and inverters everters all just musculatures intact left. She has mild tenderness on palpation of the Achilles tendon at its insertion site on the posterior lateral heel.  Assessment: Slowly resolving Achilles tendinitis left heel.  Plan: I encouraged her to continue wearing the boot for the next few weeks. Also started her on meloxicam 15 mg 1 by mouth daily for no more than a week and a time due to her Plavix. I expressed to her that if in one month she is doing no better then we will consider an MRI for split tear. Follow up with her at that time.  Julie Carey DPM

## 2015-07-12 ENCOUNTER — Ambulatory Visit: Payer: BLUE CROSS/BLUE SHIELD | Admitting: Podiatry

## 2015-07-14 ENCOUNTER — Ambulatory Visit (INDEPENDENT_AMBULATORY_CARE_PROVIDER_SITE_OTHER): Payer: BLUE CROSS/BLUE SHIELD | Admitting: Podiatry

## 2015-07-14 ENCOUNTER — Encounter: Payer: Self-pay | Admitting: Podiatry

## 2015-07-14 VITALS — BP 111/64 | HR 78 | Resp 16

## 2015-07-14 DIAGNOSIS — M7662 Achilles tendinitis, left leg: Secondary | ICD-10-CM

## 2015-07-14 MED ORDER — DICLOFENAC SODIUM 1 % TD GEL: 4.0000 g | Freq: Four times a day (QID) | TRANSDERMAL | Status: DC

## 2015-07-14 NOTE — Progress Notes (Signed)
She presents today for follow-up of Achilles tendinitis to her left ankle. She states this seems to be doing much better she rates her improvement at approximately 90%. She is unable to take oral anti-inflammatory is due to her Plavix.  Objective: Vital signs are stable she is alert and oriented 3 she has pain on palpation to the Achilles tendon is lateral insertion site on the left heel. We reviewed radiographs today together demonstrating Spurgeon posterior aspect. She will consider surgery if this does not go on and heal up.  Assessment: Calcaneal heel spur with Achilles tendinitis left.  Plan: Discussed etiology pathology conservative versus surgical therapies. At this point I recommended for her to try diclofenac gel for which we wrote a prescription. Also encouraged her to take blood pressures on a regular basis and she is a cardiac patient. I will follow up with her in 1 month at which time if she is not 100% improved we will consider an MRI for surgical consideration.  Roselind Messier DPM

## 2015-07-19 ENCOUNTER — Telehealth: Payer: Self-pay | Admitting: *Deleted

## 2015-07-19 NOTE — Telephone Encounter (Signed)
Prior authorization for Diclofenac 1% gel completed form, dx M76.62, criteria for approval listed as over 63 years old, greater than 35 years old, current regimen includes anticoagulant - pt is taking Plavix. Faxed.

## 2015-08-25 ENCOUNTER — Ambulatory Visit (INDEPENDENT_AMBULATORY_CARE_PROVIDER_SITE_OTHER): Payer: BLUE CROSS/BLUE SHIELD | Admitting: Podiatry

## 2015-08-25 ENCOUNTER — Telehealth: Payer: Self-pay | Admitting: *Deleted

## 2015-08-25 ENCOUNTER — Encounter: Payer: Self-pay | Admitting: Podiatry

## 2015-08-25 VITALS — BP 108/58 | HR 80 | Resp 16

## 2015-08-25 DIAGNOSIS — M7662 Achilles tendinitis, left leg: Secondary | ICD-10-CM

## 2015-08-25 NOTE — Telephone Encounter (Addendum)
PT referral to [place].  Orders and insurance copy faxed.

## 2015-08-25 NOTE — Progress Notes (Signed)
She presents today for follow-up of her Achilles tendinitis to her left heel. She states that she is still at approximately 90% and has not progressed over the past 6 weeks.    objective: vital signs stable alert and oriented 3 no erythema edema cellulitis drainage or odor some bogginess on palpation of the posterior lateral aspect of the insertion site of the Achilles tendon left foot.   Assessment insertional Achilles tendinitis with bursitis left.  Plan: I recommended physical therapy and follow up with me once she has completed that.   Roselind Messier DPM

## 2016-04-27 ENCOUNTER — Other Ambulatory Visit: Payer: Self-pay | Admitting: Cardiovascular Disease

## 2016-04-27 NOTE — Telephone Encounter (Signed)
Rx(s) sent to pharmacy electronically.  

## 2016-05-01 ENCOUNTER — Other Ambulatory Visit: Payer: Self-pay | Admitting: Cardiovascular Disease

## 2016-05-09 ENCOUNTER — Encounter: Payer: Self-pay | Admitting: Cardiovascular Disease

## 2016-05-09 ENCOUNTER — Ambulatory Visit (INDEPENDENT_AMBULATORY_CARE_PROVIDER_SITE_OTHER): Payer: BLUE CROSS/BLUE SHIELD | Admitting: Cardiovascular Disease

## 2016-05-09 VITALS — BP 106/62 | HR 71 | Ht 64.0 in | Wt 195.0 lb

## 2016-05-09 DIAGNOSIS — E785 Hyperlipidemia, unspecified: Secondary | ICD-10-CM

## 2016-05-09 DIAGNOSIS — I2510 Atherosclerotic heart disease of native coronary artery without angina pectoris: Secondary | ICD-10-CM

## 2016-05-09 DIAGNOSIS — Z79899 Other long term (current) drug therapy: Secondary | ICD-10-CM

## 2016-05-09 DIAGNOSIS — I10 Essential (primary) hypertension: Secondary | ICD-10-CM | POA: Diagnosis not present

## 2016-05-09 DIAGNOSIS — I2583 Coronary atherosclerosis due to lipid rich plaque: Principal | ICD-10-CM

## 2016-05-09 NOTE — Assessment & Plan Note (Signed)
History of hypertension blood pressure measurements at 106/62. She is on lisinopril and metoprolol. Continue current meds at current dosing

## 2016-05-09 NOTE — Progress Notes (Signed)
05/09/2016 Dede Query   November 13, 1950  CC:[id number]  Primary Physician Reginia Naas, MD Primary Cardiologist: Lorretta Harp MD [name]  HPI:  The patient returns today for 1 year followup. She is a 65 year old, mildly overweight, married Caucasian female, mother of 78, grandmother to 5 grandchildren who I saw 04/26/15. She is 2 years status post non-STEMI and ultimate stenting of her AV groove circumflex by Dr. Ellouise Newer with a drug-eluting stent. She had moderate but noncritical disease in her LAD and RCA. Her risk factors include hyperlipidemia and family history. She had a negative Myoview December 27, 2010. She complains of labile hypertension and some palpitations; however, her blood pressure log over the last several months, after I increased her lisinopril, showed better control of her blood pressure. She did not increase her beta blocker as I had prescribed. Her lipid profile was excellent for secondary prevention and this will be rechecked. Since I saw her a year ago she denies chest pain or shortness of breath. Her primary care physician began her on Lexapro which improved some of her symptoms probably related to anxiety.   Current Outpatient Prescriptions  Medication Sig Dispense Refill  . aspirin 81 MG tablet Take 81 mg by mouth 2 (two) times daily.     . citalopram (CELEXA) 10 MG tablet Take 10 mg by mouth at bedtime.    . clopidogrel (PLAVIX) 75 MG tablet TAKE 1 TABLET(75 MG) BY MOUTH DAILY 90 tablet 0  . diclofenac sodium (VOLTAREN) 1 % GEL Apply 4 g topically 4 (four) times daily. 100 g 4  . levothyroxine (SYNTHROID, LEVOTHROID) 75 MCG tablet TAKE 1 TABLET(75 MCG) BY MOUTH DAILY BEFORE BREAKFAST 90 tablet 0  . lisinopril (PRINIVIL,ZESTRIL) 10 MG tablet TAKE 1 TABLET(10 MG) BY MOUTH DAILY 90 tablet 0  . meloxicam (MOBIC) 15 MG tablet Take 1 tablet (15 mg total) by mouth daily. 30 tablet 3  . metoprolol tartrate (LOPRESSOR) 25 MG tablet TAKE 1 TABLET(25 MG) BY  MOUTH TWICE DAILY 180 tablet 0  . Multiple Vitamin (MULTIVITAMIN) tablet Take 1 tablet by mouth daily.    . pantoprazole (PROTONIX) 40 MG tablet TAKE 1 TABLET(40 MG) BY MOUTH DAILY 90 tablet 0  . prednisoLONE acetate (PRED FORTE) 1 % ophthalmic suspension INSTILL 1 GTT INTO RIGHT EYE BID AS DIRECTED FOR 2 WEEKS THEN QD FOR 1 WEEK  0  . simvastatin (ZOCOR) 40 MG tablet TAKE 1 TABLET(40 MG) BY MOUTH DAILY 90 tablet 0   No current facility-administered medications for this visit.    No Known Allergies  Social History   Social History  . Marital Status: Married    Spouse Name: N/A  . Number of Children: N/A  . Years of Education: N/A   Occupational History  . Not on file.   Social History Main Topics  . Smoking status: Never Smoker   . Smokeless tobacco: Not on file  . Alcohol Use: Not on file  . Drug Use: Not on file  . Sexual Activity: Not on file   Other Topics Concern  . Not on file   Social History Narrative     Review of Systems: General: negative for chills, fever, night sweats or weight changes.  Cardiovascular: negative for chest pain, dyspnea on exertion, edema, orthopnea, palpitations, paroxysmal nocturnal dyspnea or shortness of breath Dermatological: negative for rash Respiratory: negative for cough or wheezing Urologic: negative for hematuria Abdominal: negative for nausea, vomiting, diarrhea, bright red blood per rectum,  melena, or hematemesis Neurologic: negative for visual changes, syncope, or dizziness All other systems reviewed and are otherwise negative except as noted above.    Blood pressure 106/62, pulse 71, height 5\' 4"  (1.626 m), weight 195 lb (88.451 kg).  General appearance: alert and no distress Neck: no adenopathy, no carotid bruit, no JVD, supple, symmetrical, trachea midline and thyroid not enlarged, symmetric, no tenderness/mass/nodules Lungs: clear to auscultation bilaterally Heart: regular rate and rhythm, S1, S2 normal, no murmur,  click, rub or gallop Extremities: extremities normal, atraumatic, no cyanosis or edema  EKG normal sinus rhythm at 71 with nonspecific ST and T-wave changes. I personally reviewed this EKG  ASSESSMENT AND PLAN:   Coronary artery disease History of CAD status post non-STEMI prostate 3 years ago with stenting of her AV groove circumflex by Dr. Claiborne Billings with a drug-eluting stent. She had moderate noncritical disease in her LAD and RCA. Since I saw her a year ago she's remained asymptomatic.  Hyperlipidemia History of hyperlipidemia on statin therapy. We will recheck a lipid and liver profile  Essential hypertension History of hypertension blood pressure measurements at 106/62. She is on lisinopril and metoprolol. Continue current meds at current dosing      Lorretta Harp MD [facility], [facility] 05/09/2016 11:27 AM

## 2016-05-09 NOTE — Patient Instructions (Signed)
Medication Instructions:  Your physician recommends that you continue on your current medications as directed. Please refer to the Current Medication list given to you today.   Labwork: Your physician recommends that you return for lab work AT [place]. The lab can be found on the FIRST FLOOR of out building in [place]: Your physician wants you to follow-up in: [place]. You will receive a reminder letter in the mail two months in advance. If you don't receive a letter, please call our office to schedule the follow-up appointment.  If you need a refill on your cardiac medications before your next appointment, please call your pharmacy.

## 2016-05-09 NOTE — Assessment & Plan Note (Signed)
History of hyperlipidemia on statin therapy. We will recheck a lipid and liver profile 

## 2016-05-09 NOTE — Assessment & Plan Note (Signed)
History of CAD status post non-STEMI prostate 3 years ago with stenting of her AV groove circumflex by Dr. Claiborne Billings with a drug-eluting stent. She had moderate noncritical disease in her LAD and RCA. Since I saw her a year ago she's remained asymptomatic.

## 2016-05-11 LAB — LIPID PANEL
CHOL/HDL RATIO: 2.7 ratio (ref <=5.0)
Cholesterol: 149 mg/dL (ref 125–200)
HDL: 55 mg/dL (ref >=46)
LDL CALC: 66 mg/dL (ref <130)
Triglycerides: 138 mg/dL (ref <150)
VLDL: 28 mg/dL (ref <30)

## 2016-05-11 LAB — HEPATIC FUNCTION PANEL
ALBUMIN: 4.1 g/dL (ref 3.6–5.1)
ALK PHOS: 81 U/L (ref 33–130)
ALT: 24 U/L (ref 6–29)
AST: 25 U/L (ref 10–35)
BILIRUBIN TOTAL: 0.5 mg/dL (ref 0.2–1.2)
Bilirubin, Direct: 0.1 mg/dL (ref <=0.2)
Indirect Bilirubin: 0.4 mg/dL (ref 0.2–1.2)
Total Protein: 6.5 g/dL (ref 6.1–8.1)

## 2016-06-14 ENCOUNTER — Telehealth: Payer: Self-pay | Admitting: *Deleted

## 2016-06-14 NOTE — Telephone Encounter (Signed)
Requesting surgical clearance:   1. Type of surgery: Cataract extraction with intraocular lens implantation of the right eye followed by the left eye  2. Surgeon: Dr Darleen Crocker  3. Surgical date: to be determined  4. Medications that need to be held: Plavix (patient does not need to hold aspirin as stated on the clearance request.)  5. CAD: Yes     6. I will defer to: Dr Gwenlyn Found  [facility] [place] [phone] Phone-  [phone]

## 2016-06-18 NOTE — Telephone Encounter (Signed)
OK to interrupt anti platelet Rx for cataract surg  JJB

## 2016-06-18 NOTE — Telephone Encounter (Signed)
Per pharmacy protocol, patient may hold Plavix for 7 days prior to procedure. Clearance routed to number provided.

## 2016-07-26 ENCOUNTER — Other Ambulatory Visit: Payer: Self-pay | Admitting: Cardiovascular Disease

## 2016-08-04 ENCOUNTER — Other Ambulatory Visit: Payer: Self-pay | Admitting: Cardiovascular Disease

## 2017-05-13 ENCOUNTER — Other Ambulatory Visit: Payer: Self-pay | Admitting: Cardiovascular Disease

## 2017-05-13 NOTE — Telephone Encounter (Signed)
REFILL 

## 2017-05-21 ENCOUNTER — Ambulatory Visit (INDEPENDENT_AMBULATORY_CARE_PROVIDER_SITE_OTHER): Payer: Medicare Other | Admitting: Cardiovascular Disease

## 2017-05-21 ENCOUNTER — Encounter: Payer: Self-pay | Admitting: Cardiovascular Disease

## 2017-05-21 VITALS — BP 114/68 | HR 69 | Ht 64.0 in | Wt 204.0 lb

## 2017-05-21 DIAGNOSIS — I2583 Coronary atherosclerosis due to lipid rich plaque: Secondary | ICD-10-CM

## 2017-05-21 DIAGNOSIS — E785 Hyperlipidemia, unspecified: Secondary | ICD-10-CM

## 2017-05-21 DIAGNOSIS — I10 Essential (primary) hypertension: Secondary | ICD-10-CM

## 2017-05-21 DIAGNOSIS — I2510 Atherosclerotic heart disease of native coronary artery without angina pectoris: Secondary | ICD-10-CM

## 2017-05-21 NOTE — Assessment & Plan Note (Signed)
History of hyperlipidemia on statin therapy followed by her PCP. 

## 2017-05-21 NOTE — Assessment & Plan Note (Signed)
History of CAD status post myocardial infarction 10/22/10 stenting 10/24/10 of her AV groove circumflex by Dr. Claiborne Billings drug-eluting stents. She had moderate but noncritical disease in her LAD and RCA. She denies chest pain or shortness of breath.

## 2017-05-21 NOTE — Patient Instructions (Signed)
Medication Instructions: Your physician recommends that you continue on your current medications as directed. Please refer to the Current Medication list given to you today.  Labwork: I will request recent lab work from Dr. Smith's office.  Follow-Up: Your physician wants you to follow-up in: 1 year with Dr. Berry. You will receive a reminder letter in the mail two months in advance. If you don't receive a letter, please call our office to schedule the follow-up appointment.  If you need a refill on your cardiac medications before your next appointment, please call your pharmacy.  

## 2017-05-21 NOTE — Progress Notes (Signed)
05/21/2017 Julie Carey   [birth year]  [id number]  Primary Physician Carol Ada, MD Primary Cardiologist: Lorretta Harp MD Julie Carey, [place], [place]  HPI:  Julie Carey is a 66 y.o. female  mildly overweight, married Caucasian female, mother of 73, grandmother to 5 grandchildren who I saw 05/09/16. She is 2 years status post non-STEMI and ultimate stenting of her AV groove circumflex by Dr. Ellouise Newer with a drug-eluting stent. She had moderate but noncritical disease in her LAD and RCA. Her risk factors include hyperlipidemia and family history. She had a negative Myoview December 27, 2010. She complains of labile hypertension and some palpitations; however, her blood pressure log over the last several months, after I increased her lisinopril, showed better control of her blood pressure. She did not increase her beta blocker as I had prescribed. Her lipid profile was excellent for secondary prevention and this will be rechecked.  Her primary care physician began her on Lexapro which improved some of her symptoms probably related to anxiety. Since I saw her urine gushes may remain clinically stable denying chest pain or shortness of breath.   Current Meds  Medication Sig  . aspirin 81 MG tablet Take 81 mg by mouth 2 (two) times daily.   . citalopram (CELEXA) 10 MG tablet Take 10 mg by mouth at bedtime.  Marland Kitchen levothyroxine (SYNTHROID, LEVOTHROID) 75 MCG tablet Take 1 tablet (75 mcg total) by mouth daily before breakfast. KEEP OV.  Marland Kitchen lisinopril (PRINIVIL,ZESTRIL) 10 MG tablet Take 1 tablet (10 mg total) by mouth daily.  . metoprolol tartrate (LOPRESSOR) 25 MG tablet Take 1 tablet (25 mg total) by mouth 2 (two) times daily.  . Multiple Vitamin (MULTIVITAMIN) tablet Take 1 tablet by mouth daily.  . pantoprazole (PROTONIX) 40 MG tablet Take 1 tablet (40 mg total) by mouth daily.  . simvastatin (ZOCOR) 40 MG tablet Take 1 tablet (40 mg total) by mouth daily.     No Known Allergies  Social  History   Social History  . Marital status: Married    Spouse name: N/A  . Number of children: N/A  . Years of education: N/A   Occupational History  . Not on file.   Social History Main Topics  . Smoking status: Never Smoker  . Smokeless tobacco: Never Used  . Alcohol use Not on file  . Drug use: Unknown  . Sexual activity: Not on file   Other Topics Concern  . Not on file   Social History Narrative  . No narrative on file     Review of Systems: General: negative for chills, fever, night sweats or weight changes.  Cardiovascular: negative for chest pain, dyspnea on exertion, edema, orthopnea, palpitations, paroxysmal nocturnal dyspnea or shortness of breath Dermatological: negative for rash Respiratory: negative for cough or wheezing Urologic: negative for hematuria Abdominal: negative for nausea, vomiting, diarrhea, bright red blood per rectum, melena, or hematemesis Neurologic: negative for visual changes, syncope, or dizziness All other systems reviewed and are otherwise negative except as noted above.    Blood pressure 114/68, pulse 69, height 5\' 4"  (1.626 m), weight 204 lb (92.5 kg).  General appearance: alert and no distress Neck: no adenopathy, no carotid bruit, no JVD, supple, symmetrical, trachea midline and thyroid not enlarged, symmetric, no tenderness/mass/nodules Lungs: clear to auscultation bilaterally Heart: regular rate and rhythm, S1, S2 normal, no murmur, click, rub or gallop Extremities: extremities normal, atraumatic, no cyanosis or edema  EKG normal sinus rhythm at  69 with right axis deviation and nonspecific ST and T-wave changes. I personally reviewed this EKG  ASSESSMENT AND PLAN:   Coronary artery disease History of CAD status post myocardial infarction 10/22/10 stenting 10/24/10 of her AV groove circumflex by Dr. Claiborne Billings drug-eluting stents. She had moderate but noncritical disease in her LAD and RCA. She denies chest pain or shortness of  breath.  Hyperlipidemia History of hyperlipidemia on statin therapy followed by her PCP  Essential hypertension History of essential hypertension with blood pressure 114/68. She is on metoprolol and lisinopril. Continue current meds at current dosing      Lorretta Harp MD [facility], [facility] 05/21/2017 10:41 AM

## 2017-05-21 NOTE — Assessment & Plan Note (Signed)
History of essential hypertension with blood pressure 114/68. She is on metoprolol and lisinopril. Continue current meds at current dosing

## 2017-06-27 DIAGNOSIS — M5127 Other intervertebral disc displacement, lumbosacral region: Secondary | ICD-10-CM | POA: Diagnosis not present

## 2017-06-27 DIAGNOSIS — M9903 Segmental and somatic dysfunction of lumbar region: Secondary | ICD-10-CM | POA: Diagnosis not present

## 2017-06-27 DIAGNOSIS — M9905 Segmental and somatic dysfunction of pelvic region: Secondary | ICD-10-CM | POA: Diagnosis not present

## 2017-06-27 DIAGNOSIS — M25551 Pain in right hip: Secondary | ICD-10-CM | POA: Diagnosis not present

## 2017-06-27 DIAGNOSIS — M5386 Other specified dorsopathies, lumbar region: Secondary | ICD-10-CM | POA: Diagnosis not present

## 2017-06-27 DIAGNOSIS — M25552 Pain in left hip: Secondary | ICD-10-CM | POA: Diagnosis not present

## 2017-07-03 DIAGNOSIS — M9903 Segmental and somatic dysfunction of lumbar region: Secondary | ICD-10-CM | POA: Diagnosis not present

## 2017-07-03 DIAGNOSIS — M5386 Other specified dorsopathies, lumbar region: Secondary | ICD-10-CM | POA: Diagnosis not present

## 2017-07-03 DIAGNOSIS — M5127 Other intervertebral disc displacement, lumbosacral region: Secondary | ICD-10-CM | POA: Diagnosis not present

## 2017-07-03 DIAGNOSIS — M25551 Pain in right hip: Secondary | ICD-10-CM | POA: Diagnosis not present

## 2017-07-03 DIAGNOSIS — M9905 Segmental and somatic dysfunction of pelvic region: Secondary | ICD-10-CM | POA: Diagnosis not present

## 2017-07-03 DIAGNOSIS — M25552 Pain in left hip: Secondary | ICD-10-CM | POA: Diagnosis not present

## 2017-07-23 ENCOUNTER — Other Ambulatory Visit: Payer: Self-pay | Admitting: Cardiovascular Disease

## 2017-07-24 DIAGNOSIS — R400 Somnolence: Secondary | ICD-10-CM | POA: Diagnosis not present

## 2017-07-24 DIAGNOSIS — R253 Fasciculation: Secondary | ICD-10-CM | POA: Diagnosis not present

## 2017-07-24 DIAGNOSIS — E039 Hypothyroidism, unspecified: Secondary | ICD-10-CM | POA: Diagnosis not present

## 2017-07-24 DIAGNOSIS — R202 Paresthesia of skin: Secondary | ICD-10-CM | POA: Diagnosis not present

## 2017-07-31 ENCOUNTER — Other Ambulatory Visit: Payer: Self-pay | Admitting: Cardiovascular Disease

## 2017-07-31 NOTE — Telephone Encounter (Signed)
New message     *STAT* If patient is at the pharmacy, call can be transferred to refill team.   1. Which medications need to be refilled? (please list name of each medication and dose if known) clopidogrel, simvastatin, metoprolol   2. Which pharmacy/location (including street and city if local pharmacy) is medication to be sent to? [facility] on [address]  3. Do they need a 30 day or 90 day supply? 90 day

## 2017-08-02 ENCOUNTER — Other Ambulatory Visit: Payer: Self-pay | Admitting: Cardiovascular Disease

## 2017-08-02 NOTE — Telephone Encounter (Signed)
REFILL 

## 2017-08-08 ENCOUNTER — Other Ambulatory Visit: Payer: Self-pay | Admitting: Cardiovascular Disease

## 2017-08-08 NOTE — Telephone Encounter (Signed)
REFILL 

## 2017-08-28 DIAGNOSIS — G4733 Obstructive sleep apnea (adult) (pediatric): Secondary | ICD-10-CM | POA: Diagnosis not present

## 2017-09-03 DIAGNOSIS — D225 Melanocytic nevi of trunk: Secondary | ICD-10-CM | POA: Diagnosis not present

## 2017-09-03 DIAGNOSIS — D1801 Hemangioma of skin and subcutaneous tissue: Secondary | ICD-10-CM | POA: Diagnosis not present

## 2017-09-03 DIAGNOSIS — L812 Freckles: Secondary | ICD-10-CM | POA: Diagnosis not present

## 2017-09-03 DIAGNOSIS — L821 Other seborrheic keratosis: Secondary | ICD-10-CM | POA: Diagnosis not present

## 2017-09-03 DIAGNOSIS — Z85820 Personal history of malignant melanoma of skin: Secondary | ICD-10-CM | POA: Diagnosis not present

## 2017-09-04 DIAGNOSIS — Z1231 Encounter for screening mammogram for malignant neoplasm of breast: Secondary | ICD-10-CM | POA: Diagnosis not present

## 2017-09-11 DIAGNOSIS — Z0189 Encounter for other specified special examinations: Secondary | ICD-10-CM | POA: Diagnosis not present

## 2017-09-11 DIAGNOSIS — N6001 Solitary cyst of right breast: Secondary | ICD-10-CM | POA: Diagnosis not present

## 2017-09-24 ENCOUNTER — Other Ambulatory Visit: Payer: Self-pay

## 2017-09-24 NOTE — Patient Outreach (Signed)
[place] [facility]) Care Management  09/24/2017  Julie Carey 03/28/1951 [id number]   [employer] High risk screen completed. Member denies any issues or concern.   No care management needs identified at this time.  RNCM will send successful outreach letter.  Thea Silversmith, RN, MSN, [place] Coordinator Cell: [phone]

## 2017-10-23 ENCOUNTER — Other Ambulatory Visit: Payer: Self-pay | Admitting: Cardiovascular Disease

## 2017-10-31 MED ORDER — CLOPIDOGREL BISULFATE 75 MG PO TABS: 75.0000 mg | ORAL_TABLET | Freq: Every day | ORAL | 1 refills | Status: DC

## 2017-10-31 MED ORDER — METOPROLOL TARTRATE 25 MG PO TABS: 25.0000 mg | ORAL_TABLET | Freq: Two times a day (BID) | ORAL | 1 refills | Status: DC

## 2017-10-31 MED ORDER — SIMVASTATIN 40 MG PO TABS: 40.0000 mg | ORAL_TABLET | Freq: Every day | ORAL | 1 refills | Status: DC

## 2017-10-31 NOTE — Telephone Encounter (Signed)
Spoke to patient, she said pharmacy has not received the medication refills. I told patient they were sent in on January 3rd. Informed patient I will resend the refills on the Plavix, Simvastatin, Metoprolol to [facility]. Patient agreed.

## 2017-10-31 NOTE — Telephone Encounter (Signed)
Follow Up  Patient checking on the status of her refill, Please call

## 2017-11-16 DIAGNOSIS — J209 Acute bronchitis, unspecified: Secondary | ICD-10-CM | POA: Diagnosis not present

## 2017-12-02 DIAGNOSIS — J069 Acute upper respiratory infection, unspecified: Secondary | ICD-10-CM | POA: Diagnosis not present

## 2018-02-19 DIAGNOSIS — R259 Unspecified abnormal involuntary movements: Secondary | ICD-10-CM | POA: Diagnosis not present

## 2018-02-19 DIAGNOSIS — Z79899 Other long term (current) drug therapy: Secondary | ICD-10-CM | POA: Diagnosis not present

## 2018-02-20 ENCOUNTER — Encounter: Payer: Self-pay | Admitting: Neurology

## 2018-02-27 NOTE — Progress Notes (Addendum)
Julie Carey was seen today in the movement disorders clinic for neurologic consultation at the request of Carol Ada, MD.  The consultation is for the evaluation of R arm and leg tremor.  The records that were made available to me were reviewed.   Specific Symptoms:  Tremor: Yes.  , R leg started about a year ago and told due to anxiety.  The R arm started about 8 months ago.  Noted it most with activation but does state notes at rest.  Able to [place] without trouble Family hx of similar:  No. (does state that daughter and 67 y/o grandsons have some essential tremor) Voice: no change Sleep: sleeping well  Vivid Dreams:  No.  Acting out dreams:  No. Wet Pillows: No. Postural symptoms:  No.  Falls?  No. Bradykinesia symptoms: difficulty getting out of a chair (attributes to knee issues); some shortened stride length Loss of smell:  Yes.   x 15 years (attributes to dental work years ago) Loss of taste:  No. Urinary Incontinence:  No., except some stress incontinence Difficulty Swallowing:  No. Handwriting, micrographia: Yes.   Trouble with ADL's:  No.  Trouble buttoning clothing: No. Depression:  No. Memory changes:  No. Hallucinations:  No.  visual distortions: No. N/V:  No. Lightheaded:  No.  Syncope: No. Diplopia:  No. Dyskinesia:  No.  Neuroimaging of the brain has not previously been performed.   PREVIOUS MEDICATIONS: none to date  ALLERGIES:  No Known Allergies  CURRENT MEDICATIONS:  Outpatient Encounter Medications as of 03/03/2018  Medication Sig  . aspirin 81 MG tablet Take 81 mg by mouth 2 (two) times daily.   . citalopram (CELEXA) 10 MG tablet Take 10 mg by mouth at bedtime.  . clopidogrel (PLAVIX) 75 MG tablet Take 1 tablet (75 mg total) by mouth daily.  Marland Kitchen levothyroxine (SYNTHROID, LEVOTHROID) 75 MCG tablet TAKE 1 TABLET BY MOUTH DAILY BEFORE BREAKFAST  . lisinopril (PRINIVIL,ZESTRIL) 10 MG tablet Take 1 tablet (10 mg total) by mouth daily.  .  metoprolol tartrate (LOPRESSOR) 25 MG tablet Take 1 tablet (25 mg total) by mouth 2 (two) times daily.  . Multiple Vitamin (MULTIVITAMIN) tablet Take 1 tablet by mouth daily.  . pantoprazole (PROTONIX) 40 MG tablet Take 1 tablet (40 mg total) by mouth daily.  . simvastatin (ZOCOR) 40 MG tablet Take 1 tablet (40 mg total) by mouth daily.   No facility-administered encounter medications on file as of 03/03/2018.     PAST MEDICAL HISTORY:   Past Medical History:  Diagnosis Date  . Coronary artery disease   . Family history of heart disease   . Hyperlipidemia   . Hypertension     PAST SURGICAL HISTORY:   Past Surgical History:  Procedure Laterality Date  . CATARACT EXTRACTION, BILATERAL    . CORONARY ANGIOPLASTY WITH STENT PLACEMENT  10/24/2010   mid circ.AV groove 80%narrowing, [id number] Promus Element stent , entire region reduced to 0%  . NM MYOCAR PERF WALL MOTION  12/27/2010   protocol:Bruce, EF 74%,no evidence of ischemia, exercise cap. 7 METS  . TRANSTHORACIC ECHOCARDIOGRAM  10/23/2010   Ef 50-55%,all cavity sizes normal, wall thickness normal, no regurg. n    SOCIAL HISTORY:   Social History   Socioeconomic History  . Marital status: Married    Spouse name: Not on file  . Number of children: Not on file  . Years of education: Not on file  . Highest education level: Not on file  Occupational  History    Comment: taxes and bookkeeping  Social Needs  . Financial resource strain: Not on file  . Food insecurity:    Worry: Not on file    Inability: Not on file  . Transportation needs:    Medical: Not on file    Non-medical: Not on file  Tobacco Use  . Smoking status: Never Smoker  . Smokeless tobacco: Never Used  Substance and Sexual Activity  . Alcohol use: Never    Frequency: Never  . Drug use: Never  . Sexual activity: Not on file  Lifestyle  . Physical activity:    Days per week: Not on file    Minutes per session: Not on file  . Stress: Not on file    Relationships  . Social connections:    Talks on phone: Not on file    Gets together: Not on file    Attends religious service: Not on file    Active member of club or organization: Not on file    Attends meetings of clubs or organizations: Not on file    Relationship status: Not on file  . Intimate partner violence:    Fear of current or ex partner: Not on file    Emotionally abused: Not on file    Physically abused: Not on file    Forced sexual activity: Not on file  Other Topics Concern  . Not on file  Social History Narrative  . Not on file    FAMILY HISTORY:   Family Status  Relation Name Status  . Father  Deceased  . Mother  Alive  . Sister  Alive  . Sister  Alive  . Brother  Alive  . Daughter  Alive  . Daughter  Alive  . Son  Alive  . Brother  Deceased    ROS:  A complete 10 system review of systems was obtained and was unremarkable apart from what is mentioned above.  PHYSICAL EXAMINATION:    VITALS:   Vitals:   03/03/18 1022  BP: 120/70  Pulse: 68  SpO2: 98%  Weight: 204 lb (92.5 kg)  Height: 5' 4"  (1.626 m)    GEN:  The patient appears stated age and is in NAD. HEENT:  Normocephalic, atraumatic.  The mucous membranes are moist. The superficial temporal arteries are without ropiness or tenderness. CV:  RRR Lungs:  CTAB Neck/HEME:  There are no carotid bruits bilaterally.  Neurological examination:  Orientation: The patient is alert and oriented x3. Fund of knowledge is appropriate.  Recent and remote memory are intact.  Attention and concentration are normal.    Able to name objects and repeat phrases. Cranial nerves: There is good facial symmetry. Pupils are equal round and reactive to light bilaterally. Fundoscopic exam is attempted but the disc margins are not well visualized bilaterally. Extraocular muscles are intact. The visual fields are full to confrontational testing. The speech is fluent and clear. Soft palate rises symmetrically and there  is no tongue deviation. Hearing is intact to conversational tone. Sensation: Sensation is intact to light and pinprick throughout (facial, trunk, extremities). Vibration is intact at the bilateral big toe. There is no extinction with double simultaneous stimulation. There is no sensory dermatomal level identified. Motor: Strength is 5/5 in the bilateral upper and lower extremities.   Shoulder shrug is equal and symmetric.  There is no pronator drift. Deep tendon reflexes: Deep tendon reflexes are 2-/4 at the bilateral biceps, triceps, brachioradialis, patella and achilles. Plantar responses are  downgoing bilaterally.  Movement examination: Tone: There is normal tone in the bilateral upper extremities.  The tone in the lower extremities is normal.  Abnormal movements: There is RUE/RLE that increases with distraction procedures Coordination:  There is decremation with RAM's, with any form of RAMS, including alternating supination and pronation of the forearm, hand opening and closing, finger taps, heel taps and toe taps on the right.  RAMs on the L are good Gait and Station: The patient has no difficulty arising out of a deep-seated chair without the use of the hands. The patient's stride length is normal with decreased arm swing on the right.  The patient has a negative pull test.      Labs: Labs were completed on Feb 19, 2018.  These are reviewed.  TSH is 2.45.  Sodium was 138, potassium 4.8, chloride 104, CO2 26, BUN 18 and creatinine 0.91.  AST is 17, ALT 14 and alkaline phosphatase 71.  White blood cells are 6.9, hemoglobin 12.5, hematocrit 37.4 and platelets are 383.  ASSESSMENT/PLAN:  1.  Parkinsonism.  I suspect that this does represent idiopathic Parkinson's disease.  Dx: 03/03/18  -We discussed the diagnosis as well as pathophysiology of the disease.  We discussed treatment options as well as prognostic indicators.  Patient education was provided.  -We discussed that it used to be thought  that levodopa would increase risk of melanoma but now it is believed that Parkinsons itself likely increases risk of melanoma. she is to get regular skin checks.  -Greater than 50% of the 60 minute visit was spent in counseling answering questions and talking about what to expect now as well as in the future.  We talked about medication options as well as potential future surgical options.  We talked about safety in the home.  -We decided to start pramipexole and work to 0.5 mg tid.  Risks, benefits, side effects and alternative therapies were discussed.  The opportunity to ask questions was given and they were answered to the best of my ability.  The patient expressed understanding and willingness to follow the outlined treatment protocols.  -We discussed community resources in the area including patient support groups and community exercise programs for PD and pt education was provided to the patient.  -pt met with my social worker today  2.  Follow up is anticipated in the next 4 months, sooner should new neurologic issues arise.      Cc:  Carol Ada, MD

## 2018-03-03 ENCOUNTER — Ambulatory Visit (INDEPENDENT_AMBULATORY_CARE_PROVIDER_SITE_OTHER): Payer: PPO | Admitting: Neurology

## 2018-03-03 ENCOUNTER — Telehealth: Payer: Self-pay | Admitting: Neurology

## 2018-03-03 ENCOUNTER — Encounter: Payer: Self-pay | Admitting: Neurology

## 2018-03-03 VITALS — BP 120/70 | HR 68 | Ht 64.0 in | Wt 204.0 lb

## 2018-03-03 DIAGNOSIS — G20A1 Parkinson's disease without dyskinesia, without mention of fluctuations: Secondary | ICD-10-CM

## 2018-03-03 DIAGNOSIS — G20 Parkinson's disease: Secondary | ICD-10-CM | POA: Diagnosis not present

## 2018-03-03 MED ORDER — PRAMIPEXOLE DIHYDROCHLORIDE 0.125 MG PO TABS: ORAL_TABLET | ORAL | 0 refills | Status: DC

## 2018-03-03 MED ORDER — PRAMIPEXOLE DIHYDROCHLORIDE 0.5 MG PO TABS: 0.5000 mg | ORAL_TABLET | Freq: Three times a day (TID) | ORAL | 2 refills | Status: DC

## 2018-03-03 NOTE — Telephone Encounter (Signed)
Pt left a VM message wanting to know when her prescription for Mirapex will be called in to her pharmacy

## 2018-03-03 NOTE — Patient Instructions (Addendum)
1. Start mirapex (pramipexole) as follows:  0.125 mg - 1 tablet three times per day for a week, then 2 tablets three times per day for a week and then fill the 0.5 mg tablet and take that, 1 pill three times per day  2. Registration is OPEN!    Third Annual Parkinson's Education Symposium   To register: [email or web address]      Search:  [employer] person attending individually Questions: [place], [place] or [email or web address] [email or web address]

## 2018-03-03 NOTE — Telephone Encounter (Signed)
LMOM letting patient know I have sent this to [facility]. I apologized for not sending earlier today. To call with any problems/questions.

## 2018-03-10 DIAGNOSIS — H278 Other specified disorders of lens: Secondary | ICD-10-CM | POA: Diagnosis not present

## 2018-03-10 DIAGNOSIS — Z961 Presence of intraocular lens: Secondary | ICD-10-CM | POA: Diagnosis not present

## 2018-04-23 ENCOUNTER — Other Ambulatory Visit: Payer: Self-pay | Admitting: Cardiovascular Disease

## 2018-05-21 DIAGNOSIS — M25561 Pain in right knee: Secondary | ICD-10-CM | POA: Diagnosis not present

## 2018-05-21 DIAGNOSIS — M1712 Unilateral primary osteoarthritis, left knee: Secondary | ICD-10-CM | POA: Diagnosis not present

## 2018-05-21 DIAGNOSIS — M170 Bilateral primary osteoarthritis of knee: Secondary | ICD-10-CM | POA: Diagnosis not present

## 2018-05-21 DIAGNOSIS — M1711 Unilateral primary osteoarthritis, right knee: Secondary | ICD-10-CM | POA: Diagnosis not present

## 2018-05-21 DIAGNOSIS — M25562 Pain in left knee: Secondary | ICD-10-CM | POA: Diagnosis not present

## 2018-05-27 ENCOUNTER — Encounter: Payer: Self-pay | Admitting: Cardiovascular Disease

## 2018-05-27 ENCOUNTER — Ambulatory Visit: Payer: PPO | Admitting: Cardiovascular Disease

## 2018-05-27 VITALS — BP 106/64 | HR 73 | Ht 64.0 in | Wt 199.0 lb

## 2018-05-27 DIAGNOSIS — I2510 Atherosclerotic heart disease of native coronary artery without angina pectoris: Secondary | ICD-10-CM | POA: Diagnosis not present

## 2018-05-27 DIAGNOSIS — E782 Mixed hyperlipidemia: Secondary | ICD-10-CM

## 2018-05-27 DIAGNOSIS — I10 Essential (primary) hypertension: Secondary | ICD-10-CM | POA: Diagnosis not present

## 2018-05-27 NOTE — Patient Instructions (Signed)
Medication Instructions:  Your physician recommends that you continue on your current medications as directed. Please refer to the Current Medication list given to you today.   Labwork: Your physician recommends that you return for lab work in: FASTING   Testing/Procedures: none  Follow-Up: Your physician wants you to follow-up in: 12 months with Dr. Gwenlyn Found. You will receive a reminder letter in the mail two months in advance. If you don't receive a letter, please call our office to schedule the follow-up appointment.   Any Other Special Instructions Will Be Listed Below (If Applicable).     If you need a refill on your cardiac medications before your next appointment, please call your pharmacy.

## 2018-05-27 NOTE — Assessment & Plan Note (Signed)
History of hyperlipidemia on statin therapy with lipid profile performed in 2017 that revealed total cholesterol 149, LDL 66 and HDL 55.  We will recheck a lipid liver profile

## 2018-05-27 NOTE — Assessment & Plan Note (Signed)
History of CAD status post MI 10/22/2010 with stenting of her AV groove circumflex by Dr. Claiborne Billings drug-eluting stents 10/24/2010.  She had moderate but noncritical disease in her LAD and RCA.  She denies chest pain or shortness of breath her last Myoview performed 12/27/2010 was nonischemic.

## 2018-05-27 NOTE — Progress Notes (Signed)
05/27/2018 Julie Carey   08/10/1951  [id number]  Primary Physician Carol Ada, MD Primary Cardiologist: Lorretta Harp MD FACP, [place], [place], [place]  HPI:  Julie Carey is a 67 y.o.   mildly overweight, married Caucasian female, mother of 51, grandmother to 5 grandchildren who I saw 05/21/2017. She is 2 years status post non-STEMI and ultimate stenting of her AV groove circumflex by Dr. Ellouise Newer with a drug-eluting stent. She had moderate but noncritical disease in her LAD and RCA. Her risk factors include hyperlipidemia and family history. She had a negative Myoview December 27, 2010. She complains of labile hypertension and some palpitations; however, her blood pressure log over the last several months, after I increased her lisinopril, showed better control of her blood pressure. She did not increase her beta blocker as I had prescribed. Her lipid profile was excellent for secondary prevention and this will be rechecked.  Her primary care physician began her on Lexapro which improved some of her symptoms probably related to anxiety.   Since I saw her a year ago she is remained stable.  She denies chest pain or shortness of breath.  She has had some stress in her life with regards to her mom put herself in a nursing home and now wants to come back out.     Current Meds  Medication Sig  . aspirin 81 MG tablet Take 81 mg by mouth 2 (two) times daily.   . citalopram (CELEXA) 10 MG tablet Take 10 mg by mouth at bedtime.  . clopidogrel (PLAVIX) 75 MG tablet Take 1 tablet (75 mg total) by mouth daily. NEED OV.  . levothyroxine (SYNTHROID, LEVOTHROID) 75 MCG tablet TAKE 1 TABLET BY MOUTH DAILY BEFORE BREAKFAST  . lisinopril (PRINIVIL,ZESTRIL) 10 MG tablet Take 1 tablet (10 mg total) by mouth daily.  . metoprolol tartrate (LOPRESSOR) 25 MG tablet Take 1 tablet (25 mg total) by mouth 2 (two) times daily.  . Multiple Vitamin (MULTIVITAMIN) tablet Take 1 tablet by mouth daily.  . pantoprazole  (PROTONIX) 40 MG tablet Take 1 tablet (40 mg total) by mouth daily.  . pramipexole (MIRAPEX) 0.5 MG tablet Take 1 tablet (0.5 mg total) by mouth 3 (three) times daily.  . simvastatin (ZOCOR) 40 MG tablet Take 1 tablet (40 mg total) by mouth daily.     No Known Allergies  Social History   Socioeconomic History  . Marital status: Married    Spouse name: Not on file  . Number of children: Not on file  . Years of education: Not on file  . Highest education level: Not on file  Occupational History    Comment: taxes and bookkeeping  Social Needs  . Financial resource strain: Not on file  . Food insecurity:    Worry: Not on file    Inability: Not on file  . Transportation needs:    Medical: Not on file    Non-medical: Not on file  Tobacco Use  . Smoking status: Never Smoker  . Smokeless tobacco: Never Used  Substance and Sexual Activity  . Alcohol use: Never    Frequency: Never  . Drug use: Never  . Sexual activity: Not on file  Lifestyle  . Physical activity:    Days per week: Not on file    Minutes per session: Not on file  . Stress: Not on file  Relationships  . Social connections:    Talks on phone: Not on file    Gets  together: Not on file    Attends religious service: Not on file    Active member of club or organization: Not on file    Attends meetings of clubs or organizations: Not on file    Relationship status: Not on file  . Intimate partner violence:    Fear of current or ex partner: Not on file    Emotionally abused: Not on file    Physically abused: Not on file    Forced sexual activity: Not on file  Other Topics Concern  . Not on file  Social History Narrative  . Not on file     Review of Systems: General: negative for chills, fever, night sweats or weight changes.  Cardiovascular: negative for chest pain, dyspnea on exertion, edema, orthopnea, palpitations, paroxysmal nocturnal dyspnea or shortness of breath Dermatological: negative for  rash Respiratory: negative for cough or wheezing Urologic: negative for hematuria Abdominal: negative for nausea, vomiting, diarrhea, bright red blood per rectum, melena, or hematemesis Neurologic: negative for visual changes, syncope, or dizziness All other systems reviewed and are otherwise negative except as noted above.    Blood pressure 106/64, pulse 73, height 5\' 4"  (1.626 m), weight 199 lb (90.3 kg).  General appearance: alert and no distress Neck: no adenopathy, no carotid bruit, no JVD, supple, symmetrical, trachea midline and thyroid not enlarged, symmetric, no tenderness/mass/nodules Lungs: clear to auscultation bilaterally Heart: regular rate and rhythm, S1, S2 normal, no murmur, click, rub or gallop Extremities: extremities normal, atraumatic, no cyanosis or edema Pulses: 2+ and symmetric Skin: Skin color, texture, turgor normal. No rashes or lesions Neurologic: Alert and oriented X 3, normal strength and tone. Normal symmetric reflexes. Normal coordination and gait  EKG sinus rhythm at 73 with baseline artifact secondary to Parkinson's disease and low limb voltage.  I personally reviewed this EKG.  ASSESSMENT AND PLAN:   Coronary artery disease History of CAD status post MI 10/22/2010 with stenting of her AV groove circumflex by Dr. Claiborne Billings drug-eluting stents 10/24/2010.  She had moderate but noncritical disease in her LAD and RCA.  She denies chest pain or shortness of breath her last Myoview performed 12/27/2010 was nonischemic.  Hyperlipidemia History of hyperlipidemia on statin therapy with lipid profile performed in 2017 that revealed total cholesterol 149, LDL 66 and HDL 55.  We will recheck a lipid liver profile  Essential hypertension History of essential hypertension her blood pressure measured at 106/64.  She is on lisinopril and metoprolol.  Continue current meds at current dosing.      Lorretta Harp MD FACP,FACC,FAHA, [facility] 05/27/2018 2:49 PM

## 2018-05-27 NOTE — Assessment & Plan Note (Signed)
History of essential hypertension her blood pressure measured at 106/64.  She is on lisinopril and metoprolol.  Continue current meds at current dosing.

## 2018-06-13 ENCOUNTER — Other Ambulatory Visit: Payer: Self-pay | Admitting: Neurology

## 2018-06-26 ENCOUNTER — Encounter: Payer: Self-pay | Admitting: *Deleted

## 2018-07-07 NOTE — Progress Notes (Signed)
Julie Carey was seen today in the movement disorders clinic for neurologic consultation at the request of Carol Ada, MD.  The consultation is for the evaluation of R arm and leg tremor.  The records that were made available to me were reviewed.  07/09/18 update: Patient is seen today in follow-up for Parkinson's disease.  Patient was diagnosed last visit and started on pramipexole.  She is currently on pramipexole 0.5 mg 3 times daily.  She is not sure if the medication is helping but it may be.  Reports no compulsive behaviors.  No sleep attacks.  No falls.  No hallucinations.  No lightheadedness or near syncope.  The records that were made available to me were reviewed.  Saw cardiology in august.  No changes to meds.  She is walking for exercise and occasionally will bike.    PREVIOUS MEDICATIONS: none to date  ALLERGIES:  No Known Allergies  CURRENT MEDICATIONS:  Outpatient Encounter Medications as of 07/09/2018  Medication Sig  . aspirin 81 MG tablet Take 81 mg by mouth 2 (two) times daily.   . citalopram (CELEXA) 10 MG tablet Take 10 mg by mouth at bedtime.  . clopidogrel (PLAVIX) 75 MG tablet Take 1 tablet (75 mg total) by mouth daily. NEED OV.  . levothyroxine (SYNTHROID, LEVOTHROID) 75 MCG tablet TAKE 1 TABLET BY MOUTH DAILY BEFORE BREAKFAST  . lisinopril (PRINIVIL,ZESTRIL) 10 MG tablet Take 1 tablet (10 mg total) by mouth daily.  . metoprolol tartrate (LOPRESSOR) 25 MG tablet Take 1 tablet (25 mg total) by mouth 2 (two) times daily.  . Multiple Vitamin (MULTIVITAMIN) tablet Take 1 tablet by mouth daily.  . pantoprazole (PROTONIX) 40 MG tablet Take 1 tablet (40 mg total) by mouth daily.  . pramipexole (MIRAPEX) 0.5 MG tablet TAKE 1 TABLET(0.5 MG) BY MOUTH THREE TIMES DAILY  . simvastatin (ZOCOR) 40 MG tablet Take 1 tablet (40 mg total) by mouth daily.   No facility-administered encounter medications on file as of 07/09/2018.     PAST MEDICAL HISTORY:   Past Medical  History:  Diagnosis Date  . Coronary artery disease   . Family history of heart disease   . Hyperlipidemia   . Hypertension     PAST SURGICAL HISTORY:   Past Surgical History:  Procedure Laterality Date  . CATARACT EXTRACTION, BILATERAL    . CORONARY ANGIOPLASTY WITH STENT PLACEMENT  10/24/2010   mid circ.AV groove 80%narrowing, [id number] Promus Element stent , entire region reduced to 0%  . NM MYOCAR PERF WALL MOTION  12/27/2010   protocol:Bruce, EF 74%,no evidence of ischemia, exercise cap. 7 METS  . TRANSTHORACIC ECHOCARDIOGRAM  10/23/2010   Ef 50-55%,all cavity sizes normal, wall thickness normal, no regurg. n    SOCIAL HISTORY:   Social History   Socioeconomic History  . Marital status: Married    Spouse name: Not on file  . Number of children: Not on file  . Years of education: Not on file  . Highest education level: Not on file  Occupational History    Comment: taxes and bookkeeping  Social Needs  . Financial resource strain: Not on file  . Food insecurity:    Worry: Not on file    Inability: Not on file  . Transportation needs:    Medical: Not on file    Non-medical: Not on file  Tobacco Use  . Smoking status: Never Smoker  . Smokeless tobacco: Never Used  Substance and Sexual Activity  . Alcohol use:  Never    Frequency: Never  . Drug use: Never  . Sexual activity: Not on file  Lifestyle  . Physical activity:    Days per week: Not on file    Minutes per session: Not on file  . Stress: Not on file  Relationships  . Social connections:    Talks on phone: Not on file    Gets together: Not on file    Attends religious service: Not on file    Active member of club or organization: Not on file    Attends meetings of clubs or organizations: Not on file    Relationship status: Not on file  . Intimate partner violence:    Fear of current or ex partner: Not on file    Emotionally abused: Not on file    Physically abused: Not on file    Forced sexual  activity: Not on file  Other Topics Concern  . Not on file  Social History Narrative  . Not on file    FAMILY HISTORY:   Family Status  Relation Name Status  . Father  Deceased  . Mother  Alive  . Sister  Alive  . Sister  Alive  . Brother  Alive  . Daughter  Alive  . Daughter  Alive  . Son  Alive  . Brother  Deceased    ROS:  .Review of Systems  Constitutional: Negative.   HENT: Negative.   Respiratory: Negative.   Cardiovascular: Negative.   Gastrointestinal: Positive for nausea (intermittently with pramipexole).  Musculoskeletal: Negative.   Skin: Negative.   Neurological: Positive for tremors.  Endo/Heme/Allergies: Negative.      PHYSICAL EXAMINATION:    VITALS:   Vitals:   07/09/18 1046  BP: 100/70  Pulse: 74  SpO2: 97%  Weight: 198 lb (89.8 kg)  Height: 5\' 4"  (1.626 m)   GEN:  The patient appears stated age and is in NAD. HEENT:  Normocephalic, atraumatic.  The mucous membranes are moist. The superficial temporal arteries are without ropiness or tenderness. CV:  RRR Lungs:  CTAB Neck/HEME:  There are no carotid bruits bilaterally.  Neurological examination:  Orientation: The patient is alert and oriented x3. Cranial nerves: There is good facial symmetry. The speech is fluent and clear. Soft palate rises symmetrically and there is no tongue deviation. Hearing is intact to conversational tone. Sensation: Sensation is intact to light touch throughout Motor: Strength is 5/5 in the bilateral upper and lower extremities.   Shoulder shrug is equal and symmetric.  There is no pronator drift.  Movement examination: Tone: There is normal tone in the upper and lower 70s bilaterally. Abnormal movements: There is right upper extremity resting tremor that is only present with distraction.  No right lower extremity resting tremor today.  No tremor on the left. Coordination:  There is very mild decremation with RAM's, with hand opening and closing on the right,  finger taps on the right. Gait and Station: The patient has no difficulty arising out of a deep-seated chair without the use of the hands. The patient's stride length is normal.      Labs: Labs were completed on Feb 19, 2018.  These are reviewed.  TSH is 2.45.  Sodium was 138, potassium 4.8, chloride 104, CO2 26, BUN 18 and creatinine 0.91.  AST is 17, ALT 14 and alkaline phosphatase 71.  White blood cells are 6.9, hemoglobin 12.5, hematocrit 37.4 and platelets are 383.  ASSESSMENT/PLAN:  1.  Idiopathic Parkinson's disease.  Dx: 03/03/18  -We discussed the diagnosis as well as pathophysiology of the disease.  We discussed treatment options as well as prognostic indicators.  Patient education was provided.  -We discussed that it used to be thought that levodopa would increase risk of melanoma but now it is believed that Parkinsons itself likely increases risk of melanoma. she is to get regular skin checks.  -Continue pramipexole 0.5 mg 3 times per day.  Talked about medication options for the future.   - Encouraged increased exercise.  2.  Follow up is anticipated in the next few months, sooner should new neurologic issues arise.      Cc:  Carol Ada, MD

## 2018-07-09 ENCOUNTER — Ambulatory Visit (INDEPENDENT_AMBULATORY_CARE_PROVIDER_SITE_OTHER): Payer: PPO | Admitting: Neurology

## 2018-07-09 ENCOUNTER — Encounter: Payer: Self-pay | Admitting: Neurology

## 2018-07-09 VITALS — BP 100/70 | HR 74 | Ht 64.0 in | Wt 198.0 lb

## 2018-07-09 DIAGNOSIS — G20 Parkinson's disease: Secondary | ICD-10-CM | POA: Diagnosis not present

## 2018-07-17 DIAGNOSIS — E782 Mixed hyperlipidemia: Secondary | ICD-10-CM | POA: Diagnosis not present

## 2018-07-17 DIAGNOSIS — I2510 Atherosclerotic heart disease of native coronary artery without angina pectoris: Secondary | ICD-10-CM | POA: Diagnosis not present

## 2018-07-17 LAB — HEPATIC FUNCTION PANEL
ALK PHOS: 83 IU/L (ref 39–117)
ALT: 18 IU/L (ref 0–32)
AST: 22 IU/L (ref 0–40)
Albumin: 4.8 g/dL (ref 3.6–4.8)
BILIRUBIN TOTAL: 0.4 mg/dL (ref 0.0–1.2)
Bilirubin, Direct: 0.11 mg/dL (ref 0.00–0.40)
Total Protein: 6.9 g/dL (ref 6.0–8.5)

## 2018-07-17 LAB — LIPID PANEL
CHOL/HDL RATIO: 3.2 ratio (ref 0.0–4.4)
Cholesterol, Total: 158 mg/dL (ref 100–199)
HDL: 50 mg/dL (ref >39)
LDL Calculated: 84 mg/dL (ref 0–99)
TRIGLYCERIDES: 121 mg/dL (ref 0–149)
VLDL Cholesterol Cal: 24 mg/dL (ref 5–40)

## 2018-07-21 ENCOUNTER — Other Ambulatory Visit: Payer: Self-pay | Admitting: Neurology

## 2018-08-08 ENCOUNTER — Other Ambulatory Visit: Payer: Self-pay | Admitting: Cardiovascular Disease

## 2018-08-08 NOTE — Telephone Encounter (Signed)
New Message    *STAT* If patient is at the pharmacy, call can be transferred to refill team.   1. Which medications need to be refilled? (please list name of each medication and dose if known) simvastatin (ZOCOR) 40 MG tablet  2. Which pharmacy/location (including street and city if local pharmacy) is medication to be sent to? [facility] [place], [place]  3. Do they need a 30 day or 90 day supply? 90 day

## 2018-08-11 MED ORDER — SIMVASTATIN 40 MG PO TABS: 40.0000 mg | ORAL_TABLET | Freq: Every day | ORAL | 3 refills | Status: DC

## 2018-08-16 DIAGNOSIS — S42494A Other nondisplaced fracture of lower end of right humerus, initial encounter for closed fracture: Secondary | ICD-10-CM | POA: Diagnosis not present

## 2018-08-19 DIAGNOSIS — S42401A Unspecified fracture of lower end of right humerus, initial encounter for closed fracture: Secondary | ICD-10-CM | POA: Diagnosis not present

## 2018-08-20 ENCOUNTER — Ambulatory Visit
Admission: RE | Admit: 2018-08-20 | Discharge: 2018-08-20 | Disposition: A | Discharge status: Home / Self Care | Payer: PPO | Source: Ambulatory Visit | Attending: Orthopedic Surgery | Admitting: Orthopedic Surgery

## 2018-08-20 ENCOUNTER — Other Ambulatory Visit: Payer: Self-pay | Admitting: Orthopedic Surgery

## 2018-08-20 DIAGNOSIS — S42401A Unspecified fracture of lower end of right humerus, initial encounter for closed fracture: Secondary | ICD-10-CM

## 2018-08-21 ENCOUNTER — Telehealth: Payer: Self-pay | Admitting: *Deleted

## 2018-08-21 ENCOUNTER — Other Ambulatory Visit: Payer: Self-pay | Admitting: Cardiovascular Disease

## 2018-08-21 NOTE — Telephone Encounter (Signed)
Follow up:    Patient returning call back. Please call on [phone]

## 2018-08-21 NOTE — Telephone Encounter (Signed)
Patient returned call.  Given past medical history and time since last visit, based on ACC/AHA guidelines, Julie Carey would be at acceptable risk for the planned procedure without further cardiovascular testing. Getting > 4 mets of activity.   Dr. Gwenlyn Found, please give recommendations regarding DAPT. Please forward your response to [facility] PREOP.   Thank you   Leanor Kail, PA 08/21/2018, 4:01 PM

## 2018-08-21 NOTE — Telephone Encounter (Signed)
   Primary Cardiologist: Quay Burow, MD   Left voice mail to call back between pre-op hours.   [place], [place] 08/21/2018, 1:10 PM

## 2018-08-21 NOTE — Telephone Encounter (Signed)
Rx has been sent to the pharmacy electronically. ° °

## 2018-08-21 NOTE — Telephone Encounter (Signed)
   [facility] Pre-operative Risk Assessment    Request for surgical clearance:  1. What type of surgery is being performed? RIGHT  DISTAL HUMERUS FRACTURE ORIF    2. When is this surgery scheduled? 10/27/17   3. What type of clearance is required (medical clearance vs. Pharmacy clearance to hold med vs. Both)? BOTH  4. Are there any medications that need to be held prior to surgery and how long?PLAVIX AND ASPIRIN    5. Practice name and name of physician performing surgery? DR Apolonio Schneiders [facility]   6. What is your office phone number [phone]    7.   What is your office fax number? [place]   8.   Anesthesia type (None, local, MAC, general) ? UNKNOWN

## 2018-08-22 NOTE — Telephone Encounter (Signed)
Cleared for her orthopedic surgery at low risk.  She can interrupt her antiplatelet therapy.

## 2018-08-22 NOTE — Telephone Encounter (Signed)
Okay to hold ASA and Plavix for 5-7 days.   I will route this recommendation to the requesting party via Epic fax function and remove from pre-op pool.  Please call with questions.  [place], [place] 08/22/2018, 9:47 AM

## 2018-08-27 DIAGNOSIS — Y999 Unspecified external cause status: Secondary | ICD-10-CM | POA: Diagnosis not present

## 2018-08-27 DIAGNOSIS — S42451A Displaced fracture of lateral condyle of right humerus, initial encounter for closed fracture: Secondary | ICD-10-CM | POA: Diagnosis not present

## 2018-08-27 DIAGNOSIS — S52091A Other fracture of upper end of right ulna, initial encounter for closed fracture: Secondary | ICD-10-CM | POA: Diagnosis not present

## 2018-08-27 DIAGNOSIS — X58XXXA Exposure to other specified factors, initial encounter: Secondary | ICD-10-CM | POA: Diagnosis not present

## 2018-08-27 DIAGNOSIS — S42401D Unspecified fracture of lower end of right humerus, subsequent encounter for fracture with routine healing: Secondary | ICD-10-CM | POA: Diagnosis not present

## 2018-08-27 DIAGNOSIS — S52041A Displaced fracture of coronoid process of right ulna, initial encounter for closed fracture: Secondary | ICD-10-CM | POA: Diagnosis not present

## 2018-09-05 DIAGNOSIS — S42421A Displaced comminuted supracondylar fracture without intercondylar fracture of right humerus, initial encounter for closed fracture: Secondary | ICD-10-CM | POA: Diagnosis not present

## 2018-09-05 DIAGNOSIS — Z4789 Encounter for other orthopedic aftercare: Secondary | ICD-10-CM | POA: Diagnosis not present

## 2018-09-05 DIAGNOSIS — M25521 Pain in right elbow: Secondary | ICD-10-CM | POA: Diagnosis not present

## 2018-09-05 DIAGNOSIS — S42421D Displaced comminuted supracondylar fracture without intercondylar fracture of right humerus, subsequent encounter for fracture with routine healing: Secondary | ICD-10-CM | POA: Diagnosis not present

## 2018-09-10 DIAGNOSIS — M25521 Pain in right elbow: Secondary | ICD-10-CM | POA: Diagnosis not present

## 2018-09-17 DIAGNOSIS — M25521 Pain in right elbow: Secondary | ICD-10-CM | POA: Diagnosis not present

## 2018-09-24 DIAGNOSIS — M25521 Pain in right elbow: Secondary | ICD-10-CM | POA: Diagnosis not present

## 2018-09-25 DIAGNOSIS — S42401A Unspecified fracture of lower end of right humerus, initial encounter for closed fracture: Secondary | ICD-10-CM | POA: Diagnosis not present

## 2018-09-25 DIAGNOSIS — Z5189 Encounter for other specified aftercare: Secondary | ICD-10-CM | POA: Diagnosis not present

## 2018-10-01 DIAGNOSIS — M25521 Pain in right elbow: Secondary | ICD-10-CM | POA: Diagnosis not present

## 2018-10-07 DIAGNOSIS — M25521 Pain in right elbow: Secondary | ICD-10-CM | POA: Diagnosis not present

## 2018-10-17 DIAGNOSIS — M25521 Pain in right elbow: Secondary | ICD-10-CM | POA: Diagnosis not present

## 2018-10-24 DIAGNOSIS — Z1231 Encounter for screening mammogram for malignant neoplasm of breast: Secondary | ICD-10-CM | POA: Diagnosis not present

## 2018-10-24 DIAGNOSIS — S42421D Displaced comminuted supracondylar fracture without intercondylar fracture of right humerus, subsequent encounter for fracture with routine healing: Secondary | ICD-10-CM | POA: Diagnosis not present

## 2018-10-24 DIAGNOSIS — S42421A Displaced comminuted supracondylar fracture without intercondylar fracture of right humerus, initial encounter for closed fracture: Secondary | ICD-10-CM | POA: Diagnosis not present

## 2018-10-24 DIAGNOSIS — Z5189 Encounter for other specified aftercare: Secondary | ICD-10-CM | POA: Diagnosis not present

## 2018-10-24 DIAGNOSIS — M25521 Pain in right elbow: Secondary | ICD-10-CM | POA: Diagnosis not present

## 2018-11-03 DIAGNOSIS — F419 Anxiety disorder, unspecified: Secondary | ICD-10-CM | POA: Diagnosis not present

## 2018-11-03 DIAGNOSIS — K219 Gastro-esophageal reflux disease without esophagitis: Secondary | ICD-10-CM | POA: Diagnosis not present

## 2018-11-03 DIAGNOSIS — E785 Hyperlipidemia, unspecified: Secondary | ICD-10-CM | POA: Diagnosis not present

## 2018-11-03 DIAGNOSIS — I2510 Atherosclerotic heart disease of native coronary artery without angina pectoris: Secondary | ICD-10-CM | POA: Diagnosis not present

## 2018-11-03 DIAGNOSIS — E039 Hypothyroidism, unspecified: Secondary | ICD-10-CM | POA: Diagnosis not present

## 2018-11-03 DIAGNOSIS — I10 Essential (primary) hypertension: Secondary | ICD-10-CM | POA: Diagnosis not present

## 2018-11-03 DIAGNOSIS — N183 Chronic kidney disease, stage 3 (moderate): Secondary | ICD-10-CM | POA: Diagnosis not present

## 2018-11-18 NOTE — Progress Notes (Signed)
Julie Carey was seen today in the movement disorders clinic for neurologic consultation at the request of Carol Ada, MD.  The consultation is for the evaluation of R arm and leg tremor.  The records that were made available to me were reviewed.  07/09/18 update: Patient is seen today in follow-up for Parkinson's disease.  Patient was diagnosed last visit and started on pramipexole.  She is currently on pramipexole 0.5 mg 3 times daily.  She is not sure if the medication is helping but it may be.  Reports no compulsive behaviors.  No sleep attacks.  No falls.  No hallucinations.  No lightheadedness or near syncope.  The records that were made available to me were reviewed.  Saw cardiology in august.  No changes to meds.  She is walking for exercise and occasionally will bike.    11/19/18 update: Patient is seen today in follow-up for Parkinson's disease.  She is on pramipexole 0.5 mg 3 times per day.  She has had no compulsive behaviors.  No sleep attacks.  No lightheadedness or near syncope.  Patient did fall at church since our last visit and fractured her humerus.  This required surgical intervention.  She is now done with OT.  No other falls besides that.  Using stationary bike at home - 10 min twice per day.    PREVIOUS MEDICATIONS: none to date  ALLERGIES:  No Known Allergies  CURRENT MEDICATIONS:  Outpatient Encounter Medications as of 11/19/2018  Medication Sig  . aspirin 81 MG tablet Take 81 mg by mouth 2 (two) times daily.   . citalopram (CELEXA) 10 MG tablet Take 10 mg by mouth at bedtime.  . clopidogrel (PLAVIX) 75 MG tablet TAKE 1 TABLET BY MOUTH EVERY DAY  . levothyroxine (SYNTHROID, LEVOTHROID) 75 MCG tablet TAKE 1 TABLET BY MOUTH DAILY BEFORE BREAKFAST  . lisinopril (PRINIVIL,ZESTRIL) 10 MG tablet Take 1 tablet (10 mg total) by mouth daily.  . metoprolol tartrate (LOPRESSOR) 25 MG tablet Take 1 tablet (25 mg total) by mouth 2 (two) times daily.  . Multiple Vitamin  (MULTIVITAMIN) tablet Take 1 tablet by mouth daily.  . pantoprazole (PROTONIX) 40 MG tablet Take 1 tablet (40 mg total) by mouth daily.  . pramipexole (MIRAPEX) 0.5 MG tablet TAKE 1 TABLET(0.5 MG) BY MOUTH THREE TIMES DAILY  . simvastatin (ZOCOR) 40 MG tablet Take 1 tablet (40 mg total) by mouth daily.  . [DISCONTINUED] clopidogrel (PLAVIX) 75 MG tablet Take 1 tablet (75 mg total) by mouth daily. NEED OV.   No facility-administered encounter medications on file as of 11/19/2018.     PAST MEDICAL HISTORY:   Past Medical History:  Diagnosis Date  . Coronary artery disease   . Family history of heart disease   . Hyperlipidemia   . Hypertension     PAST SURGICAL HISTORY:   Past Surgical History:  Procedure Laterality Date  . CATARACT EXTRACTION, BILATERAL    . CORONARY ANGIOPLASTY WITH STENT PLACEMENT  10/24/2010   mid circ.AV groove 80%narrowing, 205X5mm Promus Element stent , entire region reduced to 0%  . NM MYOCAR PERF WALL MOTION  12/27/2010   protocol:Bruce, EF 74%,no evidence of ischemia, exercise cap. 7 METS  . TRANSTHORACIC ECHOCARDIOGRAM  10/23/2010   Ef 50-55%,all cavity sizes normal, wall thickness normal, no regurg. n    SOCIAL HISTORY:   Social History   Socioeconomic History  . Marital status: Married    Spouse name: Not on file  . Number of  children: Not on file  . Years of education: Not on file  . Highest education level: Not on file  Occupational History    Comment: taxes and bookkeeping  Social Needs  . Financial resource strain: Not on file  . Food insecurity:    Worry: Not on file    Inability: Not on file  . Transportation needs:    Medical: Not on file    Non-medical: Not on file  Tobacco Use  . Smoking status: Never Smoker  . Smokeless tobacco: Never Used  Substance and Sexual Activity  . Alcohol use: Never    Frequency: Never  . Drug use: Never  . Sexual activity: Not on file  Lifestyle  . Physical activity:    Days per week: Not on  file    Minutes per session: Not on file  . Stress: Not on file  Relationships  . Social connections:    Talks on phone: Not on file    Gets together: Not on file    Attends religious service: Not on file    Active member of club or organization: Not on file    Attends meetings of clubs or organizations: Not on file    Relationship status: Not on file  . Intimate partner violence:    Fear of current or ex partner: Not on file    Emotionally abused: Not on file    Physically abused: Not on file    Forced sexual activity: Not on file  Other Topics Concern  . Not on file  Social History Narrative  . Not on file    FAMILY HISTORY:   Family Status  Relation Name Status  . Father  Deceased  . Mother  Alive  . Sister  Alive  . Sister  Alive  . Brother  Alive  . Daughter  Alive  . Daughter  Alive  . Son  Alive  . Brother  Deceased    ROS:  .Review of Systems  Constitutional: Negative.   HENT: Negative.   Gastrointestinal: Negative.   Genitourinary: Negative.   Skin: Negative.   Neurological: Negative.   Endo/Heme/Allergies: Negative.      PHYSICAL EXAMINATION:    VITALS:   Vitals:   11/19/18 1309  BP: 120/60  Pulse: 74  SpO2: 97%  Weight: 206 lb (93.4 kg)  Height: 5\' 4"  (1.626 m)   GEN:  The patient appears stated age and is in NAD. HEENT:  Normocephalic, atraumatic.  The mucous membranes are moist. The superficial temporal arteries are without ropiness or tenderness. CV:  RRR Lungs:  CTAB Neck/HEME:  There are no carotid bruits bilaterally.  Neurological examination:  Orientation: The patient is alert and oriented x3. Cranial nerves: There is good facial symmetry. The speech is fluent and clear. Soft palate rises symmetrically and there is no tongue deviation. Hearing is intact to conversational tone. Sensation: Sensation is intact to light touch throughout Motor: Strength is 5/5 in the bilateral upper and lower extremities.   Shoulder shrug is equal and  symmetric.  There is no pronator drift.  Movement examination: Tone: There is mild increased tone in the RUE Abnormal movements: There is right upper extremity resting tremor and RLE tremor, over mild Coordination:  There is mild decremation, with any form of RAMS, including alternating supination and pronation of the forearm, hand opening and closing, finger taps, heel taps and toe taps bilaterally.   Gait and Station: The patient has no difficulty arising out of a deep-seated chair  without the use of the hands. The patient's stride length is good with almost no arm swing on the right.     Labs: Labs were completed on Feb 19, 2018.  These are reviewed.  TSH is 2.45.  Sodium was 138, potassium 4.8, chloride 104, CO2 26, BUN 18 and creatinine 0.91.  AST is 17, ALT 14 and alkaline phosphatase 71.  White blood cells are 6.9, hemoglobin 12.5, hematocrit 37.4 and platelets are 383.  ASSESSMENT/PLAN:  1.  Idiopathic Parkinson's disease.  Dx: 03/03/18  -We discussed the diagnosis as well as pathophysiology of the disease.  We discussed treatment options as well as prognostic indicators.  Patient education was provided.  -We discussed that it used to be thought that levodopa would increase risk of melanoma but now it is believed that Parkinsons itself likely increases risk of melanoma. she is to get regular skin checks.  -Continue pramipexole 0.5 mg 3 times per day.    -given fall, increased rigidity and bradykinesia since last visit, start carbidopa/levodopa 25/100, 1 po tid.  Risks, benefits, side effects and alternative therapies were discussed.  The opportunity to ask questions was given and they were answered to the best of my ability.  The patient expressed understanding and willingness to follow the outlined treatment protocols.  -exercise  -discussed DBS.  Isn't time for her at this time  2.  Follow up is anticipated in the next few months, sooner should new neurologic issues arise.  Much  greater than 50% of this visit was spent in counseling and coordinating care.  Total face to face time:  25 min      Cc:  Carol Ada, MD

## 2018-11-19 ENCOUNTER — Encounter: Payer: Self-pay | Admitting: Neurology

## 2018-11-19 ENCOUNTER — Ambulatory Visit (INDEPENDENT_AMBULATORY_CARE_PROVIDER_SITE_OTHER): Payer: PPO | Admitting: Neurology

## 2018-11-19 VITALS — BP 120/60 | HR 74 | Ht 64.0 in | Wt 206.0 lb

## 2018-11-19 DIAGNOSIS — G20 Parkinson's disease: Secondary | ICD-10-CM | POA: Diagnosis not present

## 2018-11-19 MED ORDER — CARBIDOPA-LEVODOPA 25-100 MG PO TABS: 1.0000 | ORAL_TABLET | Freq: Three times a day (TID) | ORAL | 1 refills | Status: DC

## 2018-11-19 NOTE — Patient Instructions (Signed)
Start Carbidopa Levodopa as follows:  Take 1/2 tablet three times daily, at least 30 minutes before meals, for one week  Then take 1/2 tablet in the morning, 1/2 tablet in the afternoon, 1 tablet in the evening, at least 30 minutes before meals, for one week  Then take 1/2 tablet in the morning, 1 tablet in the afternoon, 1 tablet in the evening, at least 30 minutes before meals, for one week  Then take 1 tablet three times daily, at least 30 minutes before meals

## 2018-11-20 ENCOUNTER — Ambulatory Visit: Payer: PPO | Admitting: Neurology

## 2018-11-20 DIAGNOSIS — S42401A Unspecified fracture of lower end of right humerus, initial encounter for closed fracture: Secondary | ICD-10-CM | POA: Diagnosis not present

## 2018-11-20 DIAGNOSIS — Z5189 Encounter for other specified aftercare: Secondary | ICD-10-CM | POA: Diagnosis not present

## 2018-11-21 DIAGNOSIS — N183 Chronic kidney disease, stage 3 (moderate): Secondary | ICD-10-CM | POA: Diagnosis not present

## 2019-01-01 ENCOUNTER — Other Ambulatory Visit: Payer: Self-pay | Admitting: Cardiovascular Disease

## 2019-01-01 MED ORDER — METOPROLOL TARTRATE 25 MG PO TABS: 25.0000 mg | ORAL_TABLET | Freq: Two times a day (BID) | ORAL | 6 refills | Status: DC

## 2019-01-01 NOTE — Telephone Encounter (Signed)
 *  STAT* If patient is at the pharmacy, call can be transferred to refill team.   1. Which medications need to be refilled? (please list name of each medication and dose if known) metoprolol tartrate (LOPRESSOR) 25 MG tablet  2. Which pharmacy/location (including street and city if local pharmacy) is medication to be sent to? [facility]  3. Do they need a 30 day or 90 day supply? 90  Patient has one more day supply left

## 2019-01-02 ENCOUNTER — Other Ambulatory Visit: Payer: Self-pay | Admitting: Cardiovascular Disease

## 2019-01-02 MED ORDER — METOPROLOL TARTRATE 25 MG PO TABS: 25.0000 mg | ORAL_TABLET | Freq: Two times a day (BID) | ORAL | 1 refills | Status: DC

## 2019-01-02 NOTE — Telephone Encounter (Signed)
 *  STAT* If patient is at the pharmacy, call can be transferred to refill team.   1. Which medications need to be refilled? (please list name of each medication and dose if known) metoprolol tartrate (LOPRESSOR) 25 MG tablet  2. Which pharmacy/location (including street and city if local pharmacy) is medication to be sent to? [place]  3. Do they need a 30 day or 90 day supply? 90 day  Patient is completely out of medication. Her pharmacy says they did not receive the previous fax.

## 2019-01-02 NOTE — Telephone Encounter (Signed)
Rx(s) sent to pharmacy electronically.  

## 2019-02-17 NOTE — Progress Notes (Signed)
Virtual Visit via Video Note The purpose of this virtual visit is to provide medical care while limiting exposure to the novel coronavirus.    Consent was obtained for video visit:  Yes.   Answered questions that patient had about telehealth interaction:  Yes.   I discussed the limitations, risks, security and privacy concerns of performing an evaluation and management service by telemedicine. I also discussed with the patient that there may be a patient responsible charge related to this service. The patient expressed understanding and agreed to proceed.  Pt location: Home Physician Location: office Name of referring provider:  Carol Ada, MD I connected with [name] at patients initiation/request on 02/19/2019 at 10:30 AM EDT by video enabled telemedicine application and verified that I am speaking with the correct person using two identifiers. Pt MRN:  [id number] Pt DOB:  10-15-1951 Video Participants:  [name];     History of Present Illness:  Patient is seen today in follow-up for Parkinson's disease.  She is on pramipexole, 0.5 mg 3 times per day.  Last visit, we started carbidopa/levodopa 25/100, 1 tablet 3 times per day.  She reports that tremor is a little worse and the R foot has dyskinesia.  No falls since our last visit.    Pt denies lightheadedness, near syncope.  No hallucinations.  Mood has been good.  She has started back exercising on her bike.  The records that were made available to me were reviewed, including ortho records from Dr. Apolonio Schneiders.  Current Outpatient Medications on File Prior to Visit  Medication Sig Dispense Refill  . aspirin 81 MG tablet Take 81 mg by mouth 2 (two) times daily.     . citalopram (CELEXA) 10 MG tablet Take 10 mg by mouth at bedtime.    . clopidogrel (PLAVIX) 75 MG tablet TAKE 1 TABLET BY MOUTH EVERY DAY 90 tablet 1  . levothyroxine (SYNTHROID, LEVOTHROID) 75 MCG tablet TAKE 1 TABLET BY MOUTH DAILY BEFORE BREAKFAST 90 tablet 3   . lisinopril (PRINIVIL,ZESTRIL) 10 MG tablet Take 1 tablet (10 mg total) by mouth daily. 90 tablet 3  . metoprolol tartrate (LOPRESSOR) 25 MG tablet Take 1 tablet (25 mg total) by mouth 2 (two) times daily. 180 tablet 1  . Multiple Vitamin (MULTIVITAMIN) tablet Take 1 tablet by mouth daily.    . pantoprazole (PROTONIX) 40 MG tablet Take 1 tablet (40 mg total) by mouth daily. 90 tablet 3  . simvastatin (ZOCOR) 40 MG tablet Take 1 tablet (40 mg total) by mouth daily. 90 tablet 3   No current facility-administered medications on file prior to visit.    Past Medical History:  Diagnosis Date  . Coronary artery disease   . Family history of heart disease   . Hyperlipidemia   . Hypertension    Review of Systems  Constitutional: Negative.   HENT: Negative.   Eyes: Negative.   Cardiovascular: Negative.   Genitourinary: Negative.   Musculoskeletal: Positive for joint pain (diffuse - wonders if from levodopa).  Skin: Negative.      Observations/Objective:   Vitals:   02/19/19 1011  BP: 109/66  Pulse: 72  Weight: 204 lb 3.2 oz (92.6 kg)  Height: 5\' 4"  (1.626 m)   GEN:  The patient appears stated age and is in NAD.  Neurological examination:  Orientation: The patient is alert and oriented x3. Cranial nerves: There is good facial symmetry. There is no facial hypomimia.  The speech is fluent and clear. Soft  palate rises symmetrically and there is no tongue deviation. Hearing is intact to conversational tone. Motor: Strength is at least antigravity x 4.   Shoulder shrug is equal and symmetric.  There is no pronator drift.  Movement examination: Tone: unable Abnormal movements: There is mild right upper extremity resting tremor with rest and ambulation Coordination:  There is no decremation with RAM's, with hand opening and closing, finger taps or turning a light bulb.  I was unable to see her feet well to assess toe taps or heel taps. Gait and Station: The patient's stride length is  good.      Assessment and Plan:   1.    Parkinson's disease.  Dx: 03/03/18             -She will continue pramipexole 0.5 mg 3 times per day.  No compulsive behaviors or sleep attacks.  Prescription was refilled today.           2.  Parkinson's disease dyskinesia  -She has developed this since our last visit, as a consequence of levodopa.  She, however, has had no more falls since we started levodopa.  We discussed several options.  We discussed adding amantadine, taking multiple half tablets of levodopa throughout the day instead of a full tablet, or just switching to the extended release version of levodopa, even though that may not work as well over the long-term.  We ultimately decided on just taking carbidopa/levodopa 25/100 CR, 1 tablet 3 times per day, since it is slightly less bioavailable than the immediate release tablet.  Prescription was sent to the pharmacy.  Follow Up Instructions:  I will see the patient back in 4 to 5 months, sooner should new neurologic issues arise.  -I discussed the assessment and treatment plan with the patient. The patient was provided an opportunity to ask questions and all were answered. The patient agreed with the plan and demonstrated an understanding of the instructions.   The patient was advised to call back or seek an in-person evaluation if the symptoms worsen or if the condition fails to improve as anticipated.     Alonza Bogus, DO

## 2019-02-18 ENCOUNTER — Encounter: Payer: Self-pay | Admitting: *Deleted

## 2019-02-19 ENCOUNTER — Other Ambulatory Visit: Payer: Self-pay

## 2019-02-19 ENCOUNTER — Encounter: Payer: Self-pay | Admitting: Neurology

## 2019-02-19 ENCOUNTER — Telehealth (INDEPENDENT_AMBULATORY_CARE_PROVIDER_SITE_OTHER): Payer: PPO | Admitting: Neurology

## 2019-02-19 DIAGNOSIS — G20 Parkinson's disease: Secondary | ICD-10-CM

## 2019-02-19 DIAGNOSIS — G249 Dystonia, unspecified: Secondary | ICD-10-CM

## 2019-02-19 MED ORDER — PRAMIPEXOLE DIHYDROCHLORIDE 0.5 MG PO TABS: 0.5000 mg | ORAL_TABLET | Freq: Three times a day (TID) | ORAL | 1 refills | Status: DC

## 2019-02-19 MED ORDER — CARBIDOPA-LEVODOPA ER 25-100 MG PO TBCR: 1.0000 | EXTENDED_RELEASE_TABLET | Freq: Three times a day (TID) | ORAL | 1 refills | Status: DC

## 2019-02-24 ENCOUNTER — Other Ambulatory Visit: Payer: Self-pay | Admitting: Neurology

## 2019-03-03 ENCOUNTER — Ambulatory Visit: Payer: Medicare Other | Admitting: Neurology

## 2019-04-17 DIAGNOSIS — I10 Essential (primary) hypertension: Secondary | ICD-10-CM | POA: Diagnosis not present

## 2019-04-17 DIAGNOSIS — E785 Hyperlipidemia, unspecified: Secondary | ICD-10-CM | POA: Diagnosis not present

## 2019-04-17 DIAGNOSIS — M8580 Other specified disorders of bone density and structure, unspecified site: Secondary | ICD-10-CM | POA: Diagnosis not present

## 2019-04-17 DIAGNOSIS — I2510 Atherosclerotic heart disease of native coronary artery without angina pectoris: Secondary | ICD-10-CM | POA: Diagnosis not present

## 2019-04-17 DIAGNOSIS — N183 Chronic kidney disease, stage 3 (moderate): Secondary | ICD-10-CM | POA: Diagnosis not present

## 2019-04-17 DIAGNOSIS — E039 Hypothyroidism, unspecified: Secondary | ICD-10-CM | POA: Diagnosis not present

## 2019-05-12 ENCOUNTER — Other Ambulatory Visit: Payer: Self-pay | Admitting: Cardiovascular Disease

## 2019-06-15 DIAGNOSIS — I10 Essential (primary) hypertension: Secondary | ICD-10-CM | POA: Diagnosis not present

## 2019-06-15 DIAGNOSIS — E785 Hyperlipidemia, unspecified: Secondary | ICD-10-CM | POA: Diagnosis not present

## 2019-06-15 DIAGNOSIS — F419 Anxiety disorder, unspecified: Secondary | ICD-10-CM | POA: Diagnosis not present

## 2019-06-15 DIAGNOSIS — E039 Hypothyroidism, unspecified: Secondary | ICD-10-CM | POA: Diagnosis not present

## 2019-06-15 DIAGNOSIS — Z1389 Encounter for screening for other disorder: Secondary | ICD-10-CM | POA: Diagnosis not present

## 2019-06-15 DIAGNOSIS — I2510 Atherosclerotic heart disease of native coronary artery without angina pectoris: Secondary | ICD-10-CM | POA: Diagnosis not present

## 2019-06-15 DIAGNOSIS — K219 Gastro-esophageal reflux disease without esophagitis: Secondary | ICD-10-CM | POA: Diagnosis not present

## 2019-06-15 DIAGNOSIS — Z0000 Encounter for general adult medical examination without abnormal findings: Secondary | ICD-10-CM | POA: Diagnosis not present

## 2019-06-16 ENCOUNTER — Other Ambulatory Visit: Payer: Self-pay | Admitting: Cardiovascular Disease

## 2019-07-07 ENCOUNTER — Other Ambulatory Visit: Payer: Self-pay

## 2019-07-07 ENCOUNTER — Ambulatory Visit (INDEPENDENT_AMBULATORY_CARE_PROVIDER_SITE_OTHER): Payer: PPO | Admitting: Cardiovascular Disease

## 2019-07-07 ENCOUNTER — Encounter: Payer: Self-pay | Admitting: Cardiovascular Disease

## 2019-07-07 DIAGNOSIS — E782 Mixed hyperlipidemia: Secondary | ICD-10-CM

## 2019-07-07 DIAGNOSIS — I2510 Atherosclerotic heart disease of native coronary artery without angina pectoris: Secondary | ICD-10-CM | POA: Diagnosis not present

## 2019-07-07 DIAGNOSIS — I10 Essential (primary) hypertension: Secondary | ICD-10-CM | POA: Diagnosis not present

## 2019-07-07 NOTE — Assessment & Plan Note (Signed)
History of essential hypertension blood pressure measured today at 114/60.  She is on lisinopril and metoprolol.

## 2019-07-07 NOTE — Progress Notes (Signed)
07/07/2019 Julie Carey   December 18, 1950  [id number]  Primary Physician Carol Ada, MD Primary Cardiologist: Lorretta Harp MD FACP, [place], [place], [place]  HPI:  Julie Carey is a 68 y.o.  mildly overweight, married Caucasian female, mother of 46, grandmother to 5 grandchildren who I saw  05/27/2018. She is 2 years status post non-STEMI and ultimate stenting of her AV groove circumflex by Dr. Ellouise Newer with a drug-eluting stent. She had moderate but noncritical disease in her LAD and RCA. Her risk factors include hyperlipidemia and family history. She had a negative Myoview December 27, 2010. She complains of labile hypertension and some palpitations; however, her blood pressure log over the last several months, after I increased her lisinopril, showed better control of her blood pressure. She did not increase her beta blocker as I had prescribed. Her lipid profile was excellent for secondary prevention and this will be rechecked. Her primary care physician began her on Lexapro which improved some of her symptoms probably related to anxiety.  Since I saw her a year ago she is remained stable.    She has gained 15 pounds since I last saw her.  She complains of mild dyspnea but denies chest pain.  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   Current Meds  Medication Sig  . aspirin 81 MG tablet Take 81 mg by mouth 2 (two) times daily.   . Carbidopa-Levodopa ER (SINEMET CR) 25-100 MG tablet controlled release Take 1 tablet by mouth 3 (three) times daily.  . citalopram (CELEXA) 10 MG tablet Take 10 mg by mouth at bedtime.  . clopidogrel (PLAVIX) 75 MG tablet TAKE 1 TABLET(75 MG) BY MOUTH DAILY  . levothyroxine (SYNTHROID, LEVOTHROID) 75 MCG tablet TAKE 1 TABLET BY MOUTH DAILY BEFORE BREAKFAST  . lisinopril (PRINIVIL,ZESTRIL) 10 MG tablet Take 1 tablet (10 mg total) by mouth daily.  . metoprolol tartrate (LOPRESSOR) 25 MG tablet Take 1 tablet (25 mg total)  by mouth 2 (two) times daily.  . Multiple Vitamin (MULTIVITAMIN) tablet Take 1 tablet by mouth daily.  . pantoprazole (PROTONIX) 40 MG tablet Take 1 tablet (40 mg total) by mouth daily.  . pramipexole (MIRAPEX) 0.5 MG tablet Take 1 tablet (0.5 mg total) by mouth 3 (three) times daily.  . simvastatin (ZOCOR) 40 MG tablet Take 1 tablet (40 mg total) by mouth daily.     No Known Allergies  Social History   Socioeconomic History  . Marital status: Married    Spouse name: Not on file  . Number of children: Not on file  . Years of education: Not on file  . Highest education level: Not on file  Occupational History    Comment: taxes and bookkeeping  Social Needs  . Financial resource strain: Not on file  . Food insecurity    Worry: Not on file    Inability: Not on file  . Transportation needs    Medical: Not on file    Non-medical: Not on file  Tobacco Use  . Smoking status: Never Smoker  . Smokeless tobacco: Never Used  Substance and Sexual Activity  . Alcohol use: Never    Frequency: Never  . Drug use: Never  . Sexual activity: Not on file  Lifestyle  . Physical activity    Days per week: Not on file    Minutes per session: Not on file  . Stress: Not on file  Relationships  .  Social Herbalist on phone: Not on file    Gets together: Not on file    Attends religious service: Not on file    Active member of club or organization: Not on file    Attends meetings of clubs or organizations: Not on file    Relationship status: Not on file  . Intimate partner violence    Fear of current or ex partner: Not on file    Emotionally abused: Not on file    Physically abused: Not on file    Forced sexual activity: Not on file  Other Topics Concern  . Not on file  Social History Narrative  . Not on file     Review of Systems: General: negative for chills, fever, night sweats or weight changes.  Cardiovascular: negative for chest pain, dyspnea on exertion, edema,  orthopnea, palpitations, paroxysmal nocturnal dyspnea or shortness of breath Dermatological: negative for rash Respiratory: negative for cough or wheezing Urologic: negative for hematuria Abdominal: negative for nausea, vomiting, diarrhea, bright red blood per rectum, melena, or hematemesis Neurologic: negative for visual changes, syncope, or dizziness All other systems reviewed and are otherwise negative except as noted above.    Blood pressure 114/60, pulse 71, temperature (!) 97.3 F (36.3 C), height 5\' 4"  (1.626 m), weight 213 lb (96.6 kg).  General appearance: alert and no distress Neck: no adenopathy, no carotid bruit, no JVD, supple, symmetrical, trachea midline and thyroid not enlarged, symmetric, no tenderness/mass/nodules Lungs: clear to auscultation bilaterally Heart: regular rate and rhythm, S1, S2 normal, no murmur, click, rub or gallop Extremities: extremities normal, atraumatic, no cyanosis or edema Pulses: 2+ and symmetric Skin: Skin color, texture, turgor normal. No rashes or lesions Neurologic: Alert and oriented X 3, normal strength and tone. Normal symmetric reflexes. Normal coordination and gait  EKG normal sinus rhythm at 71 with rightward axis and low QRS voltage.  I personally reviewed this EKG.  ASSESSMENT AND PLAN:   Coronary artery disease History of CAD status post non-STEMI, and ultimate stenting of her AV groove circumflex by Dr. Claiborne Billings 10/24/2010.  She did have a subsequent negative Myoview stress test 12/27/2010.  She had insignificant disease in her LAD and RCA.  She denies chest pain but has had some mild dyspnea in the setting of having gained 15 pounds in the last year.  Talk to her about weight loss.  I will see her back in 6 months.  If she continues to have shortness of breath at that time we will pursue functional testing.  Hyperlipidemia History of hyperlipidemia on statin therapy followed by her PCP.  Essential hypertension History of essential  hypertension blood pressure measured today at 114/60.  She is on lisinopril and metoprolol.      Lorretta Harp MD FACP,FACC,FAHA, [facility] 07/07/2019 9:22 AM

## 2019-07-07 NOTE — Patient Instructions (Signed)
Medication Instructions:  Your physician recommends that you continue on your current medications as directed. Please refer to the Current Medication list given to you today. If you need a refill on your cardiac medications before your next appointment, please call your pharmacy.   Lab work: none If you have labs (blood work) drawn today and your tests are completely normal, you will receive your results only by: Marland Kitchen MyChart Message (if you have MyChart) OR . A paper copy in the mail If you have any lab test that is abnormal or we need to change your treatment, we will call you to review the results.  Testing/Procedures: none  Follow-Up: At [facility], you and your health needs are our priority.  As part of our continuing mission to provide you with exceptional heart care, we have created designated Provider Care Teams.  These Care Teams include your primary Cardiologist (physician) and Advanced Practice Providers (APPs -  Physician Assistants and Nurse Practitioners) who all work together to provide you with the care you need, when you need it. You will need a follow up appointment in 6 months with Dr. Quay Burow.  Please call our office 2 months in advance to schedule this/each appointment.

## 2019-07-07 NOTE — Assessment & Plan Note (Signed)
History of hyperlipidemia on statin therapy followed by her PCP. 

## 2019-07-07 NOTE — Assessment & Plan Note (Signed)
History of CAD status post non-STEMI, and ultimate stenting of her AV groove circumflex by Dr. Claiborne Billings 10/24/2010.  She did have a subsequent negative Myoview stress test 12/27/2010.  She had insignificant disease in her LAD and RCA.  She denies chest pain but has had some mild dyspnea in the setting of having gained 15 pounds in the last year.  Talk to her about weight loss.  I will see her back in 6 months.  If she continues to have shortness of breath at that time we will pursue functional testing.

## 2019-07-20 ENCOUNTER — Ambulatory Visit: Payer: Medicare Other | Admitting: Neurology

## 2019-07-24 DIAGNOSIS — E039 Hypothyroidism, unspecified: Secondary | ICD-10-CM | POA: Diagnosis not present

## 2019-07-24 DIAGNOSIS — E785 Hyperlipidemia, unspecified: Secondary | ICD-10-CM | POA: Diagnosis not present

## 2019-08-13 IMAGING — CT CT ELBOW*R* W/O CM
3 of 6 series · 10 of 30 positions shown, 11 images · non-contrast
Comparison: None.

CLINICAL DATA: Pt fell on a gym floor 08/15/18. Pain and limited
range of motion. No previous surgery. History of melanoma left calf.
Nonspecific (abnormal) findings on radiological and other
examination of musculoskeletal system.

EXAM:
CT - CT OF THE RIGHT ELBOW WITHOUT CONTRAST
3-DIMENSIONAL CT IMAGE RENDERING ON INDEPENDENT WORKSTATION
TECHNIQUE: Multiple axial images of the right elbow were performed without
intravenous contrast. Coronal and sagittal images were provided.
3-dimensional CT images were rendered by post-processing of the
original CT data on an independent workstation. The 3-dimensional CT
images were interpreted and findings were reported in the
accompanying complete CT report for this study

[Series 4: elbow 1.50 br60 s3 ax axial bone hd fov · axial · 0.29mm/px · z∈[-808,-764]mm · 2 of 169 slices shown, 3 images]
[im 57/169  soft-tissue]
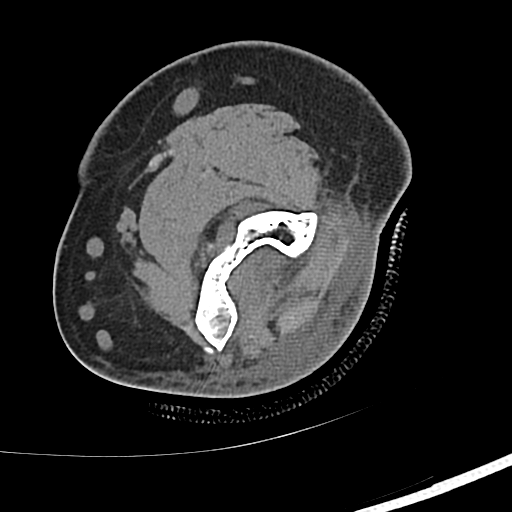
[im 57/169  bone]
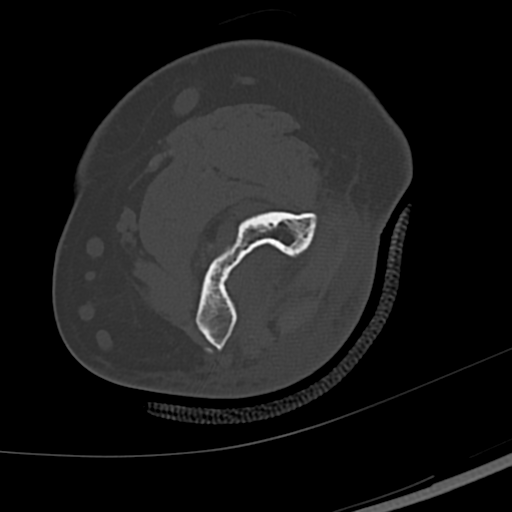
[im 113/169  bone]
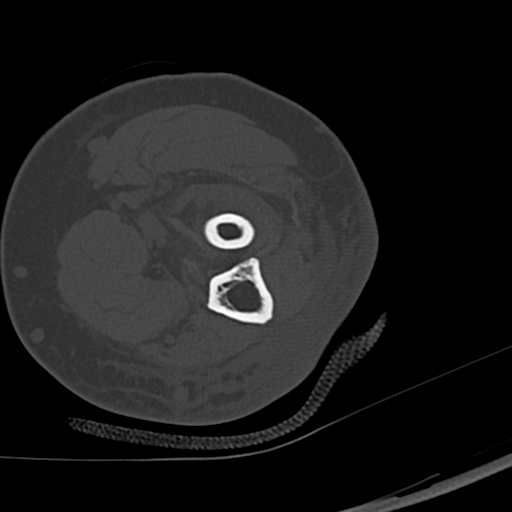

[Series 6: elbow 1.50 br40 s3 ax axial st hd fov · axial · 0.29mm/px · z∈[-809,-764]mm · 2 of 168 slices shown]
[im 56/168  bone]
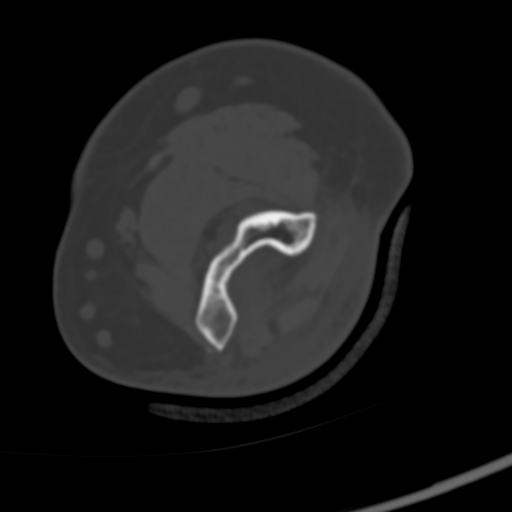
[im 112/168  bone]
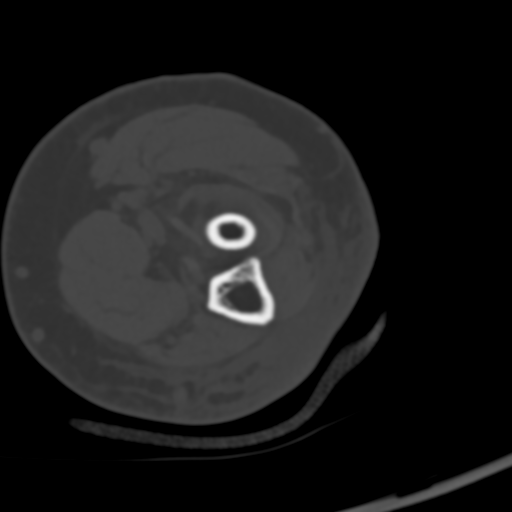

[Series 1001: sag soft · sagittal · 0.29mm/px · 6 of 54 slices shown]
[im 18/54  bone]
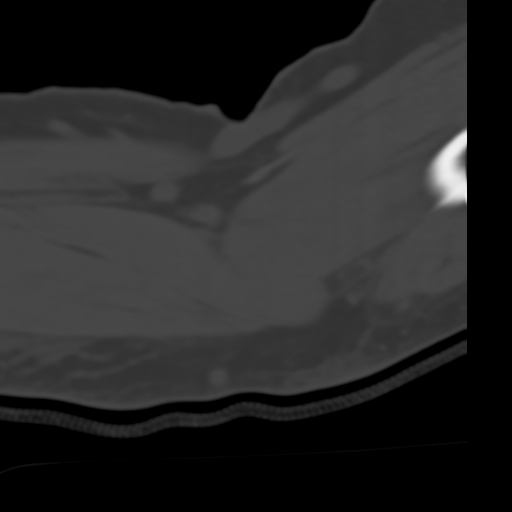
[im 23/54  bone]
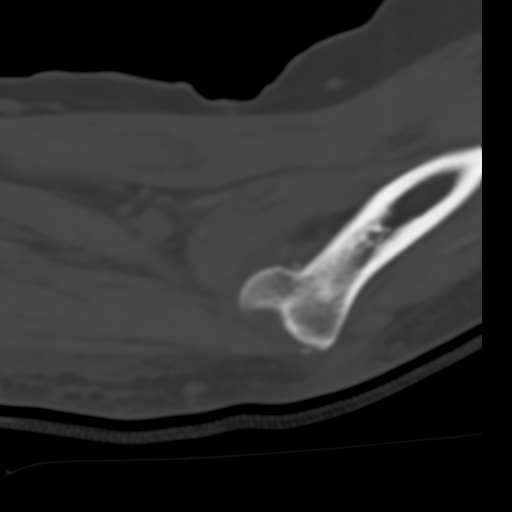
[im 27/54  bone]
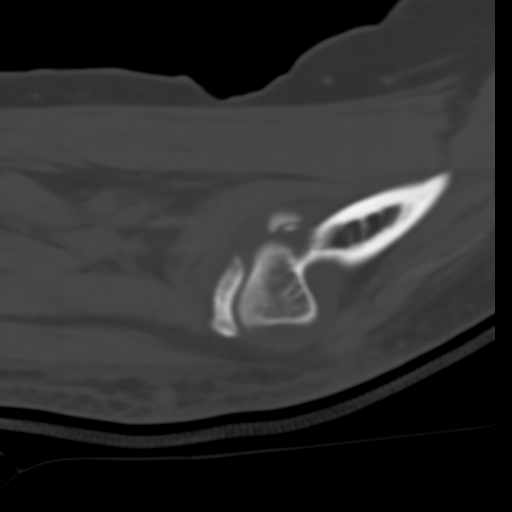
[im 31/54  bone]
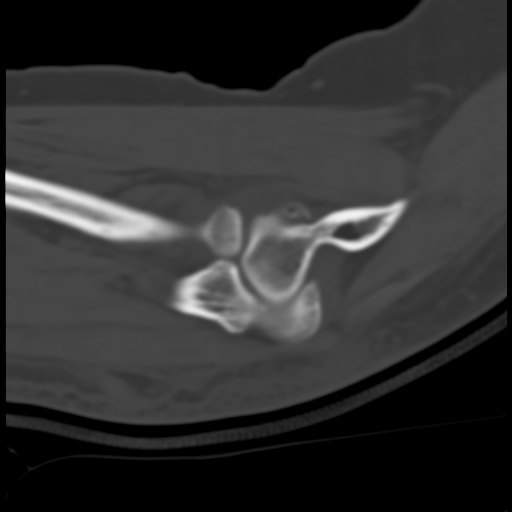
[im 35/54  soft-tissue]
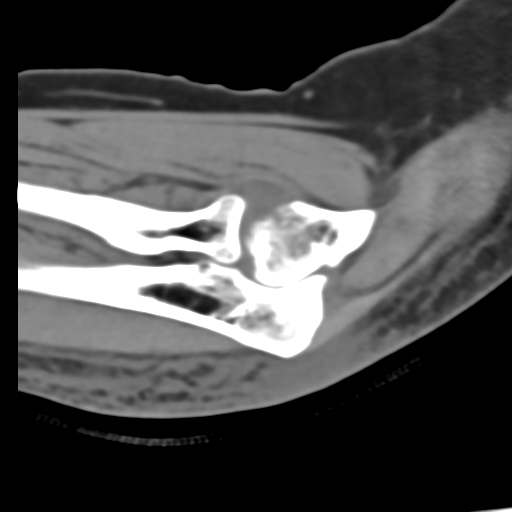
[im 36/54  bone]
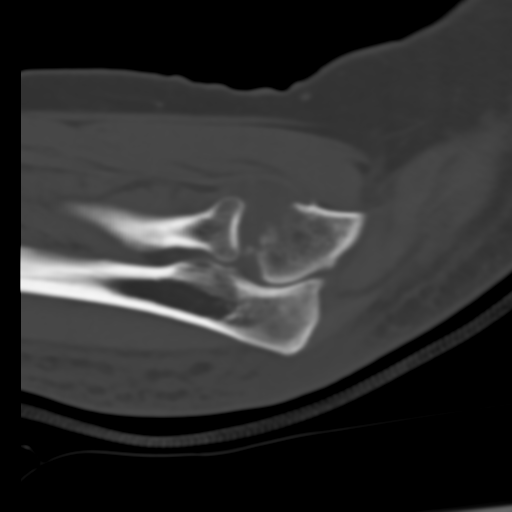

[10 of 30 positions shown; findings below may reference images not displayed]

FINDINGS: Bones/Joint/Cartilage

Severely comminuted fracture of capitellum. 11 mm of posterior
displacement of the major fracture fragment. 10 mm fracture fragment
is anteriorly displaced and located along the anterior aspect of the
capitellum. Multiple other tiny bone fragments within the anterior
joint space. Moderate sized hemarthrosis.

Tiny nondisplaced fracture of the coronoid process.

No other fracture or dislocation. No aggressive osseous lesion. No
dislocation.

Ligaments

Ligaments are suboptimally evaluated by CT.

Muscles and Tendons
Muscles are normal. No muscle atrophy. Triceps tendon is intact.
Biceps tendon is intact.

Soft tissue
No fluid collection or hematoma. No soft tissue mass. Severe soft
tissue edema along the posterior aspect of the elbow likely
reflecting soft tissue contusion.
IMPRESSION: 1. Severely comminuted fracture of capitellum. 11 mm of posterior
displacement of the major fracture fragment. 10 mm fracture fragment
is anteriorly displaced and located along the anterior aspect of the
capitellum. Multiple other tiny bone fragments within the anterior
joint space. Moderate sized hemarthrosis.

## 2019-08-14 ENCOUNTER — Other Ambulatory Visit: Payer: Self-pay | Admitting: Cardiovascular Disease

## 2019-08-17 ENCOUNTER — Other Ambulatory Visit: Payer: Self-pay

## 2019-09-02 ENCOUNTER — Telehealth: Payer: Self-pay

## 2019-09-02 DIAGNOSIS — Z7902 Long term (current) use of antithrombotics/antiplatelets: Secondary | ICD-10-CM | POA: Diagnosis not present

## 2019-09-02 DIAGNOSIS — Z1211 Encounter for screening for malignant neoplasm of colon: Secondary | ICD-10-CM | POA: Diagnosis not present

## 2019-09-02 NOTE — Telephone Encounter (Signed)
   [facility] Pre-operative Risk Assessment    Request for surgical clearance:  1. What type of surgery is being performed? COLONOSCOPY    2. When is this surgery scheduled? TBD   3. What type of clearance is required (medical clearance vs. Pharmacy clearance to hold med vs. Both)? PHARMACY  4. Are there any medications that need to be held prior to surgery and how long? PLAVIX 5 DAYS PRIOR   5. Practice name and name of physician performing surgery? [facility]  DR Therisa Doyne    6. What is your office phone number [phone]    7.   What is your office fax number [phone]  8.   Anesthesia type (None, local, MAC, general) ? PROPOFOL

## 2019-09-02 NOTE — Telephone Encounter (Signed)
   Primary Cardiologist: Quay Burow, MD  Chart reviewed as part of pre-operative protocol coverage. Per previous recommendation by Dr. Gwenlyn Found 08/2018 and lack of interval change in cardiac history since that time, Ms. Mullinax can hold her plavix 5 days prior to her upcoming colonoscopy and should resume plavix when cleared to do so by Dr. Therisa Doyne.  I will route this recommendation to the requesting party via Epic fax function and remove from pre-op pool.  Please call with questions.  Abigail Butts, PA-C 09/02/2019, 2:12 PM

## 2019-09-07 DIAGNOSIS — M25562 Pain in left knee: Secondary | ICD-10-CM | POA: Diagnosis not present

## 2019-09-07 DIAGNOSIS — M170 Bilateral primary osteoarthritis of knee: Secondary | ICD-10-CM | POA: Diagnosis not present

## 2019-09-07 DIAGNOSIS — M25561 Pain in right knee: Secondary | ICD-10-CM | POA: Diagnosis not present

## 2019-09-29 NOTE — Progress Notes (Signed)
Virtual Visit via Video Note The purpose of this virtual visit is to provide medical care while limiting exposure to the novel coronavirus.    Consent was obtained for video visit:  Yes.   Answered questions that patient had about telehealth interaction:  Yes.   I discussed the limitations, risks, security and privacy concerns of performing an evaluation and management service by telemedicine. I also discussed with the patient that there may be a patient responsible charge related to this service. The patient expressed understanding and agreed to proceed.  Pt location: Home Physician Location: office Name of referring provider:  Carol Ada, MD I connected with [name] at patients initiation/request on 10/01/2019 at  1:00 PM EST by video enabled telemedicine application and verified that I am speaking with the correct person using two identifiers. Pt MRN:  [id number] Pt DOB:  1951-10-09 Video Participants:  [name];     History of Present Illness:  Patient is seen today in follow-up for Parkinson's disease.  She is on pramipexole 0.5 mg 3 times per day.  She has had no compulsive behaviors or sleep attacks.  Last visit, we changed her from the immediate release form of levodopa to the extended release form and she is taking carbidopa/levodopa 25/100 CR, 1 tablet 3 times per day, primarily because of the dyskinesia.  She reports that she still has dyskinesia but she states that she has gained 15 lb weight gain since our last visit.  Wonders if related to levodopa.  Asked the pharmacist and he told her he did not think so.  Medical records have been reviewed since last visit.  She saw cardiology on September 15.  No changes were made to medication.  She is on lisinopril and metoprolol.  No falls.  No lightheaded.  Using exercise bike 15 min per day.    Current Outpatient Medications on File Prior to Visit  Medication Sig Dispense Refill  . aspirin 81 MG tablet Take 81 mg by  mouth 2 (two) times daily.     . citalopram (CELEXA) 10 MG tablet Take 10 mg by mouth at bedtime.    . clopidogrel (PLAVIX) 75 MG tablet TAKE 1 TABLET(75 MG) BY MOUTH DAILY 90 tablet 0  . levothyroxine (SYNTHROID, LEVOTHROID) 75 MCG tablet TAKE 1 TABLET BY MOUTH DAILY BEFORE BREAKFAST 90 tablet 3  . lisinopril (PRINIVIL,ZESTRIL) 10 MG tablet Take 1 tablet (10 mg total) by mouth daily. 90 tablet 3  . metoprolol tartrate (LOPRESSOR) 25 MG tablet Take 1 tablet (25 mg total) by mouth 2 (two) times daily. 180 tablet 1  . Multiple Vitamin (MULTIVITAMIN) tablet Take 1 tablet by mouth daily.    . pantoprazole (PROTONIX) 40 MG tablet Take 1 tablet (40 mg total) by mouth daily. 90 tablet 3  . simvastatin (ZOCOR) 40 MG tablet TAKE 1 TABLET(40 MG) BY MOUTH DAILY 90 tablet 0   No current facility-administered medications on file prior to visit.   Past Medical History:  Diagnosis Date  . Coronary artery disease   . Family history of heart disease   . Hyperlipidemia   . Hypertension    Review of Systems  Constitutional: Negative.   HENT: Negative.   Eyes: Negative.   Cardiovascular: Negative.   Gastrointestinal: Negative.   Skin: Negative.      Observations/Objective:   Vitals:   09/30/19 1323  BP: 110/70   GEN:  The patient appears stated age and is in NAD.  Neurological examination:  Orientation: The  patient is alert and oriented x3. Cranial nerves: There is good facial symmetry. There is no facial hypomimia.  The speech is fluent and clear. Soft palate rises symmetrically and there is no tongue deviation. Hearing is intact to conversational tone. Motor: Strength is at least antigravity x 4.   Shoulder shrug is equal and symmetric.  There is no pronator drift.  Movement examination: Tone: unable Abnormal movements: There is mild right upper extremity resting tremor today. Coordination:  There is no decremation with RAM's, with hand opening and closing, finger taps or turning a light  bulb.  I was unable to see her feet well to assess toe taps or heel taps. Gait and Station: The patient's stride length is good.  Did not see significant tremor with ambulation today.    Assessment and Plan:   1.    Parkinson's disease.  Dx: 03/03/18             -We talked about whether or not to discontinue pramipexole.  Told her that I have seen some weight gain with this of patients have compulsive eating.  Ultimately, she decided to go ahead and continue that for now.  She will watch whether she is using food excessively.  She will continue pramipexole 0.5 mg 3 times per day.  Risk, benefits, side effects discussed.  -Discussed online platforms, including the online biking program that we will likely be starting.  Discussed interacting with my social worker to get involved with these programs.           2.  Parkinson's disease dyskinesia  -I changed her from the immediate release to the extended release version because of this.  I did not see dyskinesia today, but she states that she still has some of it.  For now, she will continue carbidopa/levodopa 25/100 CR, 1 tablet 3 times per day.  We did discuss that if we had to discontinue pramipexole, we would likely have to increase levodopa.  3.  Hypertension  -Followed by cardiology.  On lisinopril and metoprolol.  When last seen by them in September, her blood pressure is 114/60.  Discussed that Parkinson's disease is on true care for hypertension, and we will need to watch the multiple blood pressure medications and make sure she does not develop orthostasis.  Follow Up Instructions:  I will see the patient back in 4 to 6 months, sooner should new neurologic issues arise.  -I discussed the assessment and treatment plan with the patient. The patient was provided an opportunity to ask questions and all were answered. The patient agreed with the plan and demonstrated an understanding of the instructions.  Much greater than 50% of this visit was  spent in counseling and coordinating care.  Total face to face time:  20 min   The patient was advised to call back or seek an in-person evaluation if the symptoms worsen or if the condition fails to improve as anticipated.     Alonza Bogus, DO

## 2019-09-30 ENCOUNTER — Encounter: Payer: Self-pay | Admitting: Neurology

## 2019-10-01 ENCOUNTER — Telehealth (INDEPENDENT_AMBULATORY_CARE_PROVIDER_SITE_OTHER): Payer: PPO | Admitting: Neurology

## 2019-10-01 ENCOUNTER — Other Ambulatory Visit: Payer: Self-pay

## 2019-10-01 VITALS — BP 110/70

## 2019-10-01 DIAGNOSIS — I10 Essential (primary) hypertension: Secondary | ICD-10-CM

## 2019-10-01 DIAGNOSIS — R635 Abnormal weight gain: Secondary | ICD-10-CM

## 2019-10-01 DIAGNOSIS — G20 Parkinson's disease: Secondary | ICD-10-CM | POA: Diagnosis not present

## 2019-10-01 DIAGNOSIS — G249 Dystonia, unspecified: Secondary | ICD-10-CM

## 2019-10-01 MED ORDER — PRAMIPEXOLE DIHYDROCHLORIDE 0.5 MG PO TABS: 0.5000 mg | ORAL_TABLET | Freq: Three times a day (TID) | ORAL | 1 refills | Status: DC

## 2019-10-01 MED ORDER — CARBIDOPA-LEVODOPA ER 25-100 MG PO TBCR: 1.0000 | EXTENDED_RELEASE_TABLET | Freq: Three times a day (TID) | ORAL | 1 refills | Status: DC

## 2019-10-02 DIAGNOSIS — I2510 Atherosclerotic heart disease of native coronary artery without angina pectoris: Secondary | ICD-10-CM | POA: Diagnosis not present

## 2019-10-02 DIAGNOSIS — M8580 Other specified disorders of bone density and structure, unspecified site: Secondary | ICD-10-CM | POA: Diagnosis not present

## 2019-10-02 DIAGNOSIS — E785 Hyperlipidemia, unspecified: Secondary | ICD-10-CM | POA: Diagnosis not present

## 2019-10-02 DIAGNOSIS — I10 Essential (primary) hypertension: Secondary | ICD-10-CM | POA: Diagnosis not present

## 2019-10-02 DIAGNOSIS — E039 Hypothyroidism, unspecified: Secondary | ICD-10-CM | POA: Diagnosis not present

## 2019-10-19 DIAGNOSIS — Z1159 Encounter for screening for other viral diseases: Secondary | ICD-10-CM | POA: Diagnosis not present

## 2019-10-21 DIAGNOSIS — Z1211 Encounter for screening for malignant neoplasm of colon: Secondary | ICD-10-CM | POA: Diagnosis not present

## 2019-10-21 DIAGNOSIS — K6289 Other specified diseases of anus and rectum: Secondary | ICD-10-CM | POA: Diagnosis not present

## 2019-10-21 DIAGNOSIS — K6389 Other specified diseases of intestine: Secondary | ICD-10-CM | POA: Diagnosis not present

## 2019-10-21 DIAGNOSIS — K635 Polyp of colon: Secondary | ICD-10-CM | POA: Diagnosis not present

## 2019-10-27 DIAGNOSIS — K635 Polyp of colon: Secondary | ICD-10-CM | POA: Diagnosis not present

## 2019-10-30 ENCOUNTER — Other Ambulatory Visit: Payer: Self-pay | Admitting: Cardiovascular Disease

## 2019-11-06 DIAGNOSIS — Z1231 Encounter for screening mammogram for malignant neoplasm of breast: Secondary | ICD-10-CM | POA: Diagnosis not present

## 2019-11-10 DIAGNOSIS — E785 Hyperlipidemia, unspecified: Secondary | ICD-10-CM | POA: Diagnosis not present

## 2019-11-10 DIAGNOSIS — M8580 Other specified disorders of bone density and structure, unspecified site: Secondary | ICD-10-CM | POA: Diagnosis not present

## 2019-11-10 DIAGNOSIS — E039 Hypothyroidism, unspecified: Secondary | ICD-10-CM | POA: Diagnosis not present

## 2019-11-10 DIAGNOSIS — I10 Essential (primary) hypertension: Secondary | ICD-10-CM | POA: Diagnosis not present

## 2019-11-10 DIAGNOSIS — I2510 Atherosclerotic heart disease of native coronary artery without angina pectoris: Secondary | ICD-10-CM | POA: Diagnosis not present

## 2019-11-26 DIAGNOSIS — F419 Anxiety disorder, unspecified: Secondary | ICD-10-CM | POA: Diagnosis not present

## 2019-11-26 DIAGNOSIS — I10 Essential (primary) hypertension: Secondary | ICD-10-CM | POA: Diagnosis not present

## 2019-11-26 DIAGNOSIS — I2510 Atherosclerotic heart disease of native coronary artery without angina pectoris: Secondary | ICD-10-CM | POA: Diagnosis not present

## 2019-11-26 DIAGNOSIS — G20 Parkinson's disease: Secondary | ICD-10-CM | POA: Diagnosis not present

## 2019-11-26 DIAGNOSIS — K219 Gastro-esophageal reflux disease without esophagitis: Secondary | ICD-10-CM | POA: Diagnosis not present

## 2019-11-26 DIAGNOSIS — E669 Obesity, unspecified: Secondary | ICD-10-CM | POA: Diagnosis not present

## 2019-11-26 DIAGNOSIS — E039 Hypothyroidism, unspecified: Secondary | ICD-10-CM | POA: Diagnosis not present

## 2019-11-26 DIAGNOSIS — E785 Hyperlipidemia, unspecified: Secondary | ICD-10-CM | POA: Diagnosis not present

## 2020-01-10 ENCOUNTER — Other Ambulatory Visit: Payer: Self-pay | Admitting: Cardiovascular Disease

## 2020-02-22 DIAGNOSIS — Z961 Presence of intraocular lens: Secondary | ICD-10-CM | POA: Diagnosis not present

## 2020-03-15 NOTE — Progress Notes (Signed)
Assessment/Plan:   1.  Parkinsons Disease, diagnosed May, 2019  -Continue carbidopa/levodopa 25/100 CR, 1 tablet 3 times per day  -Continue pramipexole 0.5 mg 3 times per day  -We discussed that it used to be thought that levodopa would increase risk of melanoma but now it is believed that Parkinsons itself likely increases risk of melanoma. she is to get regular skin checks.   2.  Parkinson's disease dyskinesia  -This was the primary reason we switched from the immediate release to the extended release version of levodopa.  I did not see any dyskinesia today.  3.  Hypertension  -Followed by cardiology.  On multiple antihypertensives.  4.  R foot dystonia with associated pain  -discussed referral for botox.  She is interested in that.  Will refer to Dr. Letta Pate.   Subjective:   Julie Carey was seen today in follow up for Parkinsons disease.  My previous records were reviewed prior to todays visit as well as outside records available to me. Pt denies falls.  Pt denies lightheadedness, near syncope.  No hallucinations.  Mood has been good.  Denies dyskinesia.  Does have tremor.  Working on exercise bike but "not much during tax season."  Current prescribed movement disorder medications: Carbidopa/levodopa 25/100 CR, 1 tablet 3 times per day Pramipexole, 0.5 mg 3 times per day  PREVIOUS MEDICATIONS: carbidopa/levodopa 25/100 IR (changed because of dyskinesia)  ALLERGIES:  No Known Allergies  CURRENT MEDICATIONS:  Outpatient Encounter Medications as of 03/17/2020  Medication Sig  . aspirin 81 MG tablet Take 81 mg by mouth daily.   . Carbidopa-Levodopa ER (SINEMET CR) 25-100 MG tablet controlled release Take 1 tablet by mouth 3 (three) times daily.  . citalopram (CELEXA) 10 MG tablet Take 10 mg by mouth at bedtime.  . clopidogrel (PLAVIX) 75 MG tablet TAKE 1 TABLET(75 MG) BY MOUTH DAILY  . levothyroxine (SYNTHROID, LEVOTHROID) 75 MCG tablet TAKE 1 TABLET BY MOUTH DAILY  BEFORE BREAKFAST  . lisinopril (PRINIVIL,ZESTRIL) 10 MG tablet Take 1 tablet (10 mg total) by mouth daily.  . metoprolol tartrate (LOPRESSOR) 25 MG tablet TAKE 1 TABLET(25 MG) BY MOUTH TWICE DAILY  . Multiple Vitamin (MULTIVITAMIN) tablet Take 1 tablet by mouth daily.  . pantoprazole (PROTONIX) 40 MG tablet Take 1 tablet (40 mg total) by mouth daily.  . pramipexole (MIRAPEX) 0.5 MG tablet Take 1 tablet (0.5 mg total) by mouth 3 (three) times daily.  . simvastatin (ZOCOR) 40 MG tablet TAKE 1 TABLET(40 MG) BY MOUTH DAILY   No facility-administered encounter medications on file as of 03/17/2020.    Objective:   PHYSICAL EXAMINATION:    VITALS:   Vitals:   03/17/20 1043  BP: 135/80  Pulse: 81  SpO2: 97%  Weight: 218 lb (98.9 kg)  Height: 5\' 4"  (1.626 m)    GEN:  The patient appears stated age and is in NAD. HEENT:  Normocephalic, atraumatic.  The mucous membranes are moist. The superficial temporal arteries are without ropiness or tenderness. CV:  RRR Lungs:  CTAB Neck/HEME:  There are no carotid bruits bilaterally.  Neurological examination:  Orientation: The patient is alert and oriented x3. Cranial nerves: There is good facial symmetry with minimal facial hypomimia. The speech is fluent and clear. Soft palate rises symmetrically and there is no tongue deviation. Hearing is intact to conversational tone. Sensation: Sensation is intact to light touch throughout Motor: Strength is at least antigravity x4.  Movement examination: Tone: There is normal tone in the  UE/LE Abnormal movements: there is RUE rest tremor; there is dystonic posturing of the R foot Coordination:  There is no decremation with RAM's, with any form of RAMS, including alternating supination and pronation of the forearm, hand opening and closing, finger taps, heel taps and toe taps. Gait and Station: The patient has no difficulty arising out of a deep-seated chair without the use of the hands. The patient's  stride length is good.     Total time spent on today's visit was 30 minutes, including both face-to-face time and nonface-to-face time.  Time included that spent on review of records (prior notes available to me/labs/imaging if pertinent), discussing treatment and goals, answering patient's questions and coordinating care.  Cc:  Carol Ada, MD

## 2020-03-17 ENCOUNTER — Other Ambulatory Visit: Payer: Self-pay

## 2020-03-17 ENCOUNTER — Ambulatory Visit: Payer: PPO | Admitting: Neurology

## 2020-03-17 ENCOUNTER — Encounter: Payer: Self-pay | Admitting: Neurology

## 2020-03-17 VITALS — BP 135/80 | HR 81 | Ht 64.0 in | Wt 218.0 lb

## 2020-03-17 DIAGNOSIS — Z780 Asymptomatic menopausal state: Secondary | ICD-10-CM | POA: Diagnosis not present

## 2020-03-17 DIAGNOSIS — M8589 Other specified disorders of bone density and structure, multiple sites: Secondary | ICD-10-CM | POA: Diagnosis not present

## 2020-03-17 DIAGNOSIS — G249 Dystonia, unspecified: Secondary | ICD-10-CM | POA: Diagnosis not present

## 2020-03-17 DIAGNOSIS — G20 Parkinson's disease: Secondary | ICD-10-CM | POA: Diagnosis not present

## 2020-03-17 NOTE — Patient Instructions (Signed)
1.  We will send you a referral to Dr. Letta Pate 2.  Make an appointment with dermatology

## 2020-03-18 ENCOUNTER — Encounter: Payer: Self-pay | Admitting: Physical Medicine & Rehabilitation

## 2020-04-05 ENCOUNTER — Encounter: Payer: Self-pay | Admitting: Physical Medicine & Rehabilitation

## 2020-04-05 ENCOUNTER — Encounter: Payer: PPO | Attending: Physical Medicine & Rehabilitation | Admitting: Physical Medicine & Rehabilitation

## 2020-04-05 ENCOUNTER — Other Ambulatory Visit: Payer: Self-pay

## 2020-04-05 VITALS — BP 119/77 | HR 74 | Temp 97.5°F | Ht 62.0 in | Wt 217.0 lb

## 2020-04-05 DIAGNOSIS — R29898 Other symptoms and signs involving the musculoskeletal system: Secondary | ICD-10-CM | POA: Insufficient documentation

## 2020-04-05 DIAGNOSIS — G248 Other dystonia: Secondary | ICD-10-CM | POA: Insufficient documentation

## 2020-04-05 DIAGNOSIS — M24551 Contracture, right hip: Secondary | ICD-10-CM

## 2020-04-05 NOTE — Patient Instructions (Signed)
Right tibialis posterior weakness and Hip internal rotator tightness to be addressed by PT   If persistent then will need botox at next visit for focal dystonia

## 2020-04-05 NOTE — Progress Notes (Signed)
Subjective:    Patient ID: Julie Carey, female    DOB: 11/27/1950, 69 y.o.   MRN: [id number]  HPI 69 year old female with primary complaint of right foot turning out ordered referred by neurology for the evaluation for focal dystonia right lower extremity. Patient has history of Parkinson's disease managed with levodopa carbidopa as well as pramipexole, who complains that over the last few months she has noticed her right foot turning outward.  She denies any injury to the right ankle or foot area.  She has had no previous surgeries.  She denied back pain or sciatic pain down the right lower extremity.  She denies numbness or tingling in the right lower extremity. Patient indicates that this occurs when patient is walking but does not notice it when she is trying to sleep or in a seated position. She cannot tell whether this is progressing  This has not affected ambulatory distance.  No other new functional deficits have been noted Pain Inventory Average Pain 7 Pain Right Now 3 My pain is intermittent and sharp  In the last 24 hours, has pain interfered with the following? General activity 2 Relation with others 0 Enjoyment of life 2 What TIME of day is your pain at its worst? 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  yes  Neuro/Psych bladder control problems weakness tremor trouble walking anxiety loss of taste or smell  Prior Studies new, botox eval  Physicians involved in your care new botox eval   Family History  Problem Relation Age of Onset  . Heart failure Father   . Hypertension Mother   . Stroke Mother   . Hydrocephalus Brother    Social History   Socioeconomic History  . Marital status: Married    Spouse name: Not on  file  . Number of children: Not on file  . Years of education: Not on file  . Highest education level: Not on file  Occupational History    Comment: taxes and bookkeeping  Tobacco Use  . Smoking status: Never Smoker  . Smokeless tobacco: Never Used  Vaping Use  . Vaping Use: Never used  Substance and Sexual Activity  . Alcohol use: Never  . Drug use: Never  . Sexual activity: Not on file  Other Topics Concern  . Not on file  Social History Narrative   Right handed   Social Determinants of Health   Financial Resource Strain:   . Difficulty of Paying Living Expenses:   Food Insecurity:   . Worried About Charity fundraiser in the Last Year:   . Arboriculturist in the Last Year:   Transportation Needs:   . Film/video editor (Medical):   Marland Kitchen Lack of Transportation (Non-Medical):   Physical Activity:   . Days of Exercise per Week:   . Minutes of Exercise per Session:   Stress:   . Feeling of Stress :   Social Connections:   . Frequency of Communication with Friends and Family:   . Frequency of Social Gatherings with Friends and Family:   . Attends Religious Services:   . Active Member of Clubs or Organizations:   . Attends Archivist Meetings:   Marland Kitchen Marital Status:    Past Surgical History:  Procedure Laterality Date  . CATARACT EXTRACTION, BILATERAL    . CORONARY ANGIOPLASTY WITH STENT PLACEMENT  10/24/2010   mid circ.AV groove 80%narrowing, 205X4mm Promus Element stent , entire region reduced to 0%  . NM MYOCAR PERF WALL MOTION  12/27/2010   protocol:Bruce, EF 74%,no evidence of ischemia, exercise cap. 7 METS  . TRANSTHORACIC ECHOCARDIOGRAM  10/23/2010   Ef 50-55%,all cavity sizes normal, wall thickness normal, no regurg. n   Past Medical History:  Diagnosis Date  . Coronary artery disease   . Family history of heart disease   . Hyperlipidemia   . Hypertension    BP 119/77   Pulse 74   Temp (!) 97.5 F (36.4 C)   Ht 5\' 2"  (1.575 m)   Wt 217 lb  (98.4 kg)   SpO2 97%   BMI 39.69 kg/m   Opioid Risk Score:   Fall Risk Score:  `1  Depression screen PHQ 2/9  Depression screen PHQ 2/9 03/17/2020  Decreased Interest 0  Down, Depressed, Hopeless 0  PHQ - 2 Score 0     Review of Systems  Constitutional: Positive for unexpected weight change.  Respiratory: Positive for shortness of breath.   Musculoskeletal: Positive for gait problem.  Neurological: Positive for tremors and weakness.  Psychiatric/Behavioral: The patient is nervous/anxious.   All other systems reviewed and are negative.      Objective:   Physical Exam Vitals and nursing note reviewed.  Constitutional:      Appearance: She is obese.  HENT:     Head: Normocephalic and atraumatic.  Eyes:     Extraocular Movements: Extraocular movements intact.     Conjunctiva/sclera: Conjunctivae normal.     Pupils: Pupils are equal, round, and reactive to light.  Cardiovascular:     Rate and Rhythm: Normal rate and regular rhythm.     Pulses: Normal pulses.     Heart sounds: Normal heart sounds. No murmur heard.   Pulmonary:     Effort: Pulmonary effort is normal. No respiratory distress.     Breath sounds: Normal breath sounds. No stridor. No wheezing.  Abdominal:     General: Abdomen is flat. Bowel sounds are normal. There is no distension.     Palpations: Abdomen is soft. There is no mass.     Tenderness: There is no abdominal tenderness.  Musculoskeletal:        General: No tenderness.     Cervical back: Normal range of motion.     Right hip: No deformity, tenderness or crepitus. Decreased range of motion.     Right ankle: No swelling, deformity or ecchymosis. No tenderness. Normal range of motion.     Right Achilles Tendon: Normal.     Right foot: No swelling, Charcot foot, foot drop or tenderness.     Comments: Right hip has decreased internal rotation with normal external rotation The patient ambulates with right foot in external rotation there is some  external rotation noted at the patella as well.  Skin:    General: Skin is warm and dry.  Neurological:     Mental Status: She is alert and oriented to person, place, and time.     Sensory: Sensation is intact.     Motor: Weakness present.     Gait: Gait is intact.     Comments: Sensation intact to pinprick and light touch bilateral lower extremities in the L4 L5-S1 dermatomes Negative straight leg raising bilaterally There is no evidence of tremor in the foot or  ankle area Motor strength is 5/5 bilateral hip flexor knee extensor ankle dorsiflexor and foot evertors, 4/5 right foot inverters 5/5 left foot inverters  Psychiatric:        Mood and Affect: Mood normal.        Behavior: Behavior normal.        Thought Content: Thought content normal.       Assessment & Plan:  #1.  Gait abnormality with externally rotated and everted posture of the right lower extremity.  While this may represent focal dystonia the patient also has right foot evertor weakness as well as right hip contracture which also may be playing a role in the  gait deviation. Recommend referral to physical therapy to address hip contracture as well as potential hip internal rotator weakness as well as the foot inverter weakness.  If this fails to correct the gait deviation would recommend botulinum toxin 100 units to the foot evertors.  Would use EMG to identify whether peroneus longus brevis or tertius are most active

## 2020-04-07 ENCOUNTER — Other Ambulatory Visit: Payer: Self-pay | Admitting: Cardiovascular Disease

## 2020-04-15 DIAGNOSIS — E039 Hypothyroidism, unspecified: Secondary | ICD-10-CM | POA: Diagnosis not present

## 2020-04-15 DIAGNOSIS — N183 Chronic kidney disease, stage 3 unspecified: Secondary | ICD-10-CM | POA: Diagnosis not present

## 2020-04-15 DIAGNOSIS — M8580 Other specified disorders of bone density and structure, unspecified site: Secondary | ICD-10-CM | POA: Diagnosis not present

## 2020-04-15 DIAGNOSIS — I10 Essential (primary) hypertension: Secondary | ICD-10-CM | POA: Diagnosis not present

## 2020-04-15 DIAGNOSIS — I2510 Atherosclerotic heart disease of native coronary artery without angina pectoris: Secondary | ICD-10-CM | POA: Diagnosis not present

## 2020-04-15 DIAGNOSIS — E785 Hyperlipidemia, unspecified: Secondary | ICD-10-CM | POA: Diagnosis not present

## 2020-05-17 ENCOUNTER — Encounter: Payer: PPO | Admitting: Physical Medicine & Rehabilitation

## 2020-05-19 ENCOUNTER — Other Ambulatory Visit: Payer: Self-pay | Admitting: Neurology

## 2020-05-25 ENCOUNTER — Other Ambulatory Visit: Payer: Self-pay | Admitting: Neurology

## 2020-05-25 MED ORDER — PRAMIPEXOLE DIHYDROCHLORIDE 0.5 MG PO TABS: 0.5000 mg | ORAL_TABLET | Freq: Three times a day (TID) | ORAL | 1 refills | Status: DC

## 2020-05-25 NOTE — Telephone Encounter (Signed)
Rx(s) sent to pharmacy electronically.  Left detailed message stating that rx has been sent to the pharmacy, ok per DPR, and to call back if any questions.

## 2020-05-25 NOTE — Telephone Encounter (Signed)
Patient called in needing a refill on her pramipexole sent to [facility] on [address]

## 2020-06-28 DIAGNOSIS — F419 Anxiety disorder, unspecified: Secondary | ICD-10-CM | POA: Diagnosis not present

## 2020-06-28 DIAGNOSIS — Z1389 Encounter for screening for other disorder: Secondary | ICD-10-CM | POA: Diagnosis not present

## 2020-06-28 DIAGNOSIS — G20 Parkinson's disease: Secondary | ICD-10-CM | POA: Diagnosis not present

## 2020-06-28 DIAGNOSIS — Z7902 Long term (current) use of antithrombotics/antiplatelets: Secondary | ICD-10-CM | POA: Diagnosis not present

## 2020-06-28 DIAGNOSIS — I10 Essential (primary) hypertension: Secondary | ICD-10-CM | POA: Diagnosis not present

## 2020-06-28 DIAGNOSIS — N183 Chronic kidney disease, stage 3 unspecified: Secondary | ICD-10-CM | POA: Diagnosis not present

## 2020-06-28 DIAGNOSIS — E039 Hypothyroidism, unspecified: Secondary | ICD-10-CM | POA: Diagnosis not present

## 2020-06-28 DIAGNOSIS — I2510 Atherosclerotic heart disease of native coronary artery without angina pectoris: Secondary | ICD-10-CM | POA: Diagnosis not present

## 2020-06-28 DIAGNOSIS — E785 Hyperlipidemia, unspecified: Secondary | ICD-10-CM | POA: Diagnosis not present

## 2020-06-28 DIAGNOSIS — Z0000 Encounter for general adult medical examination without abnormal findings: Secondary | ICD-10-CM | POA: Diagnosis not present

## 2020-06-29 DIAGNOSIS — M170 Bilateral primary osteoarthritis of knee: Secondary | ICD-10-CM | POA: Diagnosis not present

## 2020-06-29 DIAGNOSIS — M25562 Pain in left knee: Secondary | ICD-10-CM | POA: Diagnosis not present

## 2020-06-29 DIAGNOSIS — M25561 Pain in right knee: Secondary | ICD-10-CM | POA: Diagnosis not present

## 2020-07-06 DIAGNOSIS — M170 Bilateral primary osteoarthritis of knee: Secondary | ICD-10-CM | POA: Diagnosis not present

## 2020-07-06 DIAGNOSIS — M25562 Pain in left knee: Secondary | ICD-10-CM | POA: Diagnosis not present

## 2020-07-06 DIAGNOSIS — M25561 Pain in right knee: Secondary | ICD-10-CM | POA: Diagnosis not present

## 2020-07-13 DIAGNOSIS — M1711 Unilateral primary osteoarthritis, right knee: Secondary | ICD-10-CM | POA: Diagnosis not present

## 2020-07-13 DIAGNOSIS — M170 Bilateral primary osteoarthritis of knee: Secondary | ICD-10-CM | POA: Diagnosis not present

## 2020-07-13 DIAGNOSIS — M1712 Unilateral primary osteoarthritis, left knee: Secondary | ICD-10-CM | POA: Diagnosis not present

## 2020-08-02 ENCOUNTER — Other Ambulatory Visit: Payer: Self-pay | Admitting: Cardiovascular Disease

## 2020-09-01 NOTE — Progress Notes (Signed)
Assessment/Plan:   1.  Parkinsons Disease  -Continue carbidopa/levodopa 25/100 CR, 1 tablet 3 times per day  -Continue pramipexole 0.5 mg 3 times per day.  2.  PD dyskinesia  -Better with CR version of levodopa.  Haven't seen dyskinesia since we changed  3.  Hypertension  -Followed by cardiology, on multiple antihypertensives.  4.  Right foot dystonia  -Was referred to Dr. Letta Pate for possible Botox.  He recommended she try physical therapy first, but she did not follow back up with them.  Encouraged her to do so if dystonia sx's increase and she agreed  5. F/u 6 months   Subjective:   Julie Carey was seen today in follow up for Parkinsons disease.  My previous records were reviewed prior to todays visit as well as outside records available to me. Pt denies falls.  Pt denies lightheadedness, near syncope.  No hallucinations.  Mood has been good.  I sent the patient to Dr. Letta Pate for consideration for possible Botox.  She was seen in June 15.  He recommended that she try physical therapy before going for Botox.  It does not appear that she followed up.  She decided to hold on the PT.  She did get an insert in the shoe and that helped with the pain.  Current prescribed movement disorder medications: Carbidopa/levodopa 25/100 CR, 1 tablet 3 times per day Pramipexole 0.5 mg 3 times per day.   PREVIOUS MEDICATIONS: carbidopa/levodopa 25/100 IR (changed because of dyskinesia)  ALLERGIES:  No Known Allergies  CURRENT MEDICATIONS:  Outpatient Encounter Medications as of 09/06/2020  Medication Sig  . aspirin 81 MG tablet Take 81 mg by mouth daily.   . calcium carbonate (OSCAL) 1500 (600 Ca) MG TABS tablet Take 1 tablet by mouth daily with breakfast.  . Carbidopa-Levodopa ER (SINEMET CR) 25-100 MG tablet controlled release TAKE 1 TABLET BY MOUTH THREE TIMES DAILY  . citalopram (CELEXA) 10 MG tablet Take 10 mg by mouth at bedtime.  . clopidogrel (PLAVIX) 75 MG tablet TAKE 1  TABLET(75 MG) BY MOUTH DAILY  . levothyroxine (SYNTHROID, LEVOTHROID) 75 MCG tablet TAKE 1 TABLET BY MOUTH DAILY BEFORE BREAKFAST  . lisinopril (PRINIVIL,ZESTRIL) 10 MG tablet Take 1 tablet (10 mg total) by mouth daily.  . metoprolol tartrate (LOPRESSOR) 25 MG tablet TAKE 1 TABLET(25 MG) BY MOUTH TWICE DAILY  . Multiple Vitamin (MULTIVITAMIN) tablet Take 1 tablet by mouth daily.  . pramipexole (MIRAPEX) 0.5 MG tablet Take 1 tablet (0.5 mg total) by mouth 3 (three) times daily.  . simvastatin (ZOCOR) 40 MG tablet TAKE 1 TABLET(40 MG) BY MOUTH DAILY  . LORazepam (ATIVAN) 0.5 MG tablet Take 1 tablet by mouth daily as needed.  . [DISCONTINUED] pantoprazole (PROTONIX) 40 MG tablet Take 1 tablet (40 mg total) by mouth daily. (Patient not taking: Reported on 09/06/2020)   No facility-administered encounter medications on file as of 09/06/2020.    Objective:   PHYSICAL EXAMINATION:    VITALS:   Vitals:   09/06/20 1101  BP: 130/75  Pulse: 85  SpO2: 95%  Weight: 212 lb (96.2 kg)  Height: 5' 2.5" (1.588 m)    GEN:  The patient appears stated age and is in NAD. HEENT:  Normocephalic, atraumatic.  The mucous membranes are moist. The superficial temporal arteries are without ropiness or tenderness. CV:  RRR Lungs:  CTAB Neck/HEME:  There are no carotid bruits bilaterally.  Neurological examination:  Orientation: The patient is alert and oriented x3. Cranial nerves:  There is good facial symmetry with min facial hypomimia. The speech is fluent and clear. Soft palate rises symmetrically and there is no tongue deviation. Hearing is intact to conversational tone. Sensation: Sensation is intact to light touch throughout Motor: Strength is at least antigravity x4.  Movement examination: Tone: There is normal tone in the UE/LE Abnormal movements: there is RUE rest tremor and bilateral LE rest tremor; there is mild dystonic posturing of the R foot Coordination:  There is no decremation with  RAM's, with any form of RAMS, including alternating supination and pronation of the forearm, hand opening and closing, finger taps, heel taps and toe taps. Gait and Station: The patient has no difficulty arising out of a deep-seated chair without the use of the hands. The patient's stride length is good.    I have reviewed and interpreted the following labs independently    Chemistry      Component Value Date/Time   NA 140 10/25/2010 0515   K 4.1 10/25/2010 0515   CL 112 10/25/2010 0515   CO2 24 10/25/2010 0515   BUN 10 10/25/2010 0515   CREATININE 0.86 10/25/2010 0515      Component Value Date/Time   CALCIUM 8.6 10/25/2010 0515   ALKPHOS 83 07/17/2018 1020   AST 22 07/17/2018 1020   ALT 18 07/17/2018 1020   BILITOT 0.4 07/17/2018 1020       Lab Results  Component Value Date   WBC 9.0 10/25/2010   HGB 11.5 (L) 10/25/2010   HCT 35.0 (L) 10/25/2010   MCV 87.1 10/25/2010   PLT 287 10/25/2010    Lab Results  Component Value Date   TSH 2.765 10/23/2010     Total time spent on today's visit was 20 minutes, including both face-to-face time and nonface-to-face time.  Time included that spent on review of records (prior notes available to me/labs/imaging if pertinent), discussing treatment and goals, answering patient's questions and coordinating care.  Cc:  Carol Ada, MD

## 2020-09-06 ENCOUNTER — Other Ambulatory Visit: Payer: Self-pay

## 2020-09-06 ENCOUNTER — Ambulatory Visit: Payer: PPO | Admitting: Neurology

## 2020-09-06 ENCOUNTER — Encounter: Payer: Self-pay | Admitting: Neurology

## 2020-09-06 VITALS — BP 130/75 | HR 85 | Ht 62.5 in | Wt 212.0 lb

## 2020-09-06 DIAGNOSIS — G20 Parkinson's disease: Secondary | ICD-10-CM | POA: Diagnosis not present

## 2020-09-06 MED ORDER — CARBIDOPA-LEVODOPA ER 25-100 MG PO TBCR: 1.0000 | EXTENDED_RELEASE_TABLET | Freq: Three times a day (TID) | ORAL | 1 refills | Status: DC

## 2020-09-06 NOTE — Patient Instructions (Signed)
You look great!    The physicians and staff at [facility] are committed to providing excellent care. You may receive a survey requesting feedback about your experience at our office. We strive to receive "very good" responses to the survey questions. If you feel that your experience would prevent you from giving the office a "very good " response, please contact our office to try to remedy the situation. We may be reached at [phone]. Thank you for taking the time out of your busy day to complete the survey.  

## 2020-09-27 ENCOUNTER — Ambulatory Visit: Payer: PPO | Admitting: Neurology

## 2020-09-27 DIAGNOSIS — I2510 Atherosclerotic heart disease of native coronary artery without angina pectoris: Secondary | ICD-10-CM | POA: Diagnosis not present

## 2020-09-27 DIAGNOSIS — K219 Gastro-esophageal reflux disease without esophagitis: Secondary | ICD-10-CM | POA: Diagnosis not present

## 2020-09-27 DIAGNOSIS — M8580 Other specified disorders of bone density and structure, unspecified site: Secondary | ICD-10-CM | POA: Diagnosis not present

## 2020-09-27 DIAGNOSIS — I10 Essential (primary) hypertension: Secondary | ICD-10-CM | POA: Diagnosis not present

## 2020-09-27 DIAGNOSIS — N183 Chronic kidney disease, stage 3 unspecified: Secondary | ICD-10-CM | POA: Diagnosis not present

## 2020-09-27 DIAGNOSIS — G20 Parkinson's disease: Secondary | ICD-10-CM | POA: Diagnosis not present

## 2020-09-27 DIAGNOSIS — E785 Hyperlipidemia, unspecified: Secondary | ICD-10-CM | POA: Diagnosis not present

## 2020-09-27 DIAGNOSIS — E039 Hypothyroidism, unspecified: Secondary | ICD-10-CM | POA: Diagnosis not present

## 2020-09-28 ENCOUNTER — Other Ambulatory Visit: Payer: Self-pay | Admitting: Cardiovascular Disease

## 2020-11-09 ENCOUNTER — Other Ambulatory Visit: Payer: Self-pay

## 2020-11-09 ENCOUNTER — Other Ambulatory Visit: Payer: Self-pay | Admitting: Cardiovascular Disease

## 2020-11-09 MED ORDER — SIMVASTATIN 40 MG PO TABS: ORAL_TABLET | ORAL | 0 refills | Status: DC

## 2020-11-09 MED ORDER — CLOPIDOGREL BISULFATE 75 MG PO TABS
ORAL_TABLET | ORAL | 3 refills | Status: DC
Start: 2020-11-09 — End: 2021-04-27

## 2020-11-29 DIAGNOSIS — Z1231 Encounter for screening mammogram for malignant neoplasm of breast: Secondary | ICD-10-CM | POA: Diagnosis not present

## 2020-12-19 DIAGNOSIS — M8580 Other specified disorders of bone density and structure, unspecified site: Secondary | ICD-10-CM | POA: Diagnosis not present

## 2020-12-19 DIAGNOSIS — E039 Hypothyroidism, unspecified: Secondary | ICD-10-CM | POA: Diagnosis not present

## 2020-12-19 DIAGNOSIS — E785 Hyperlipidemia, unspecified: Secondary | ICD-10-CM | POA: Diagnosis not present

## 2020-12-19 DIAGNOSIS — N183 Chronic kidney disease, stage 3 unspecified: Secondary | ICD-10-CM | POA: Diagnosis not present

## 2020-12-19 DIAGNOSIS — I2510 Atherosclerotic heart disease of native coronary artery without angina pectoris: Secondary | ICD-10-CM | POA: Diagnosis not present

## 2020-12-19 DIAGNOSIS — K219 Gastro-esophageal reflux disease without esophagitis: Secondary | ICD-10-CM | POA: Diagnosis not present

## 2020-12-19 DIAGNOSIS — I10 Essential (primary) hypertension: Secondary | ICD-10-CM | POA: Diagnosis not present

## 2020-12-19 DIAGNOSIS — G20 Parkinson's disease: Secondary | ICD-10-CM | POA: Diagnosis not present

## 2020-12-26 DIAGNOSIS — E039 Hypothyroidism, unspecified: Secondary | ICD-10-CM | POA: Diagnosis not present

## 2020-12-26 DIAGNOSIS — K219 Gastro-esophageal reflux disease without esophagitis: Secondary | ICD-10-CM | POA: Diagnosis not present

## 2020-12-26 DIAGNOSIS — Z7902 Long term (current) use of antithrombotics/antiplatelets: Secondary | ICD-10-CM | POA: Diagnosis not present

## 2020-12-26 DIAGNOSIS — I2510 Atherosclerotic heart disease of native coronary artery without angina pectoris: Secondary | ICD-10-CM | POA: Diagnosis not present

## 2020-12-26 DIAGNOSIS — F419 Anxiety disorder, unspecified: Secondary | ICD-10-CM | POA: Diagnosis not present

## 2020-12-26 DIAGNOSIS — I10 Essential (primary) hypertension: Secondary | ICD-10-CM | POA: Diagnosis not present

## 2020-12-26 DIAGNOSIS — N183 Chronic kidney disease, stage 3 unspecified: Secondary | ICD-10-CM | POA: Diagnosis not present

## 2020-12-26 DIAGNOSIS — G20 Parkinson's disease: Secondary | ICD-10-CM | POA: Diagnosis not present

## 2020-12-26 DIAGNOSIS — E669 Obesity, unspecified: Secondary | ICD-10-CM | POA: Diagnosis not present

## 2020-12-26 DIAGNOSIS — E785 Hyperlipidemia, unspecified: Secondary | ICD-10-CM | POA: Diagnosis not present

## 2021-01-18 ENCOUNTER — Other Ambulatory Visit: Payer: Self-pay | Admitting: Cardiovascular Disease

## 2021-01-26 ENCOUNTER — Other Ambulatory Visit: Payer: Self-pay | Admitting: Cardiovascular Disease

## 2021-01-31 ENCOUNTER — Telehealth: Payer: Self-pay | Admitting: Neurology

## 2021-01-31 MED ORDER — PRAMIPEXOLE DIHYDROCHLORIDE 0.5 MG PO TABS
0.5000 mg | ORAL_TABLET | Freq: Three times a day (TID) | ORAL | 0 refills | Status: DC
Start: 2021-01-31 — End: 2021-06-12

## 2021-01-31 NOTE — Telephone Encounter (Signed)
Rx(s) sent to pharmacy electronically.  Patient notified and voiced understanding.  

## 2021-01-31 NOTE — Telephone Encounter (Signed)
Patient called in stating she is needing a refill of her pramipexole and the pharmacy had sent a request, but had not heard back from [place]. Pharmacy is [facility] on [address]

## 2021-02-17 DIAGNOSIS — E785 Hyperlipidemia, unspecified: Secondary | ICD-10-CM | POA: Diagnosis not present

## 2021-02-17 DIAGNOSIS — G20 Parkinson's disease: Secondary | ICD-10-CM | POA: Diagnosis not present

## 2021-02-17 DIAGNOSIS — E039 Hypothyroidism, unspecified: Secondary | ICD-10-CM | POA: Diagnosis not present

## 2021-02-17 DIAGNOSIS — K219 Gastro-esophageal reflux disease without esophagitis: Secondary | ICD-10-CM | POA: Diagnosis not present

## 2021-02-17 DIAGNOSIS — I10 Essential (primary) hypertension: Secondary | ICD-10-CM | POA: Diagnosis not present

## 2021-02-17 DIAGNOSIS — I2510 Atherosclerotic heart disease of native coronary artery without angina pectoris: Secondary | ICD-10-CM | POA: Diagnosis not present

## 2021-02-17 DIAGNOSIS — N183 Chronic kidney disease, stage 3 unspecified: Secondary | ICD-10-CM | POA: Diagnosis not present

## 2021-02-17 DIAGNOSIS — M8580 Other specified disorders of bone density and structure, unspecified site: Secondary | ICD-10-CM | POA: Diagnosis not present

## 2021-03-03 NOTE — Progress Notes (Signed)
Assessment/Plan:   1.  Parkinsons Disease  -Continue carbidopa/levodopa 25/100 CR, 1 tablet 3 times per day  -Continue pramipexole 0.5 mg 3 times per day.  -discussed with pt whether or not to increase the med and she decided not to for now.  -Discussed surgical interventions, but neither of [place] is ready for that right now.  -Encouraged increased exercise  -She met with my LCSW today.  2.  PD dyskinesia  -Better with CR version of levodopa.  Haven't seen dyskinesia since we changed  3.  Hypertension  -On multiple antihypertensives  -Follows with cardiology  4.  Right foot dystonia  -Was referred to Dr. Letta Pate for possible Botox.  He recommended she try physical therapy first, but she did not follow back up with them.  Encouraged her to do so if dystonia sx's increase and she agreed   Subjective:   Julie Carey was seen today in follow up for Parkinsons disease.  My previous records were reviewed prior to todays visit as well as outside records available to me. Pt denies falls.  Pt denies lightheadedness, near syncope.  No hallucinations.  Mood has been good.   She hasn't done much move exercise since tax season.  Current prescribed movement disorder medications: Carbidopa/levodopa 25/100 CR, 1 tablet 3 times per day Pramipexole 0.5 mg 3 times per day.   PREVIOUS MEDICATIONS: carbidopa/levodopa 25/100 IR (changed because of dyskinesia)  ALLERGIES:  No Known Allergies  CURRENT MEDICATIONS:  Outpatient Encounter Medications as of 03/07/2021  Medication Sig  . aspirin 81 MG tablet Take 81 mg by mouth daily.   . calcium carbonate (OSCAL) 1500 (600 Ca) MG TABS tablet Take 1 tablet by mouth daily with breakfast.  . Carbidopa-Levodopa ER (SINEMET CR) 25-100 MG tablet controlled release Take 1 tablet by mouth 3 (three) times daily. 7am/11am/4pm  . citalopram (CELEXA) 10 MG tablet Take 10 mg by mouth at bedtime.  . clopidogrel (PLAVIX) 75 MG tablet TAKE 1 TABLET(75 MG) BY  MOUTH DAILY  . levothyroxine (SYNTHROID, LEVOTHROID) 75 MCG tablet TAKE 1 TABLET BY MOUTH DAILY BEFORE BREAKFAST  . lisinopril (PRINIVIL,ZESTRIL) 10 MG tablet Take 1 tablet (10 mg total) by mouth daily.  Marland Kitchen LORazepam (ATIVAN) 0.5 MG tablet Take 1 tablet by mouth daily as needed.  . metoprolol tartrate (LOPRESSOR) 25 MG tablet TAKE 1 TABLET(25 MG) BY MOUTH TWICE DAILY  . Multiple Vitamin (MULTIVITAMIN) tablet Take 1 tablet by mouth daily.  . pramipexole (MIRAPEX) 0.5 MG tablet Take 1 tablet (0.5 mg total) by mouth 3 (three) times daily.  . simvastatin (ZOCOR) 40 MG tablet TAKE 1 TABLET(40 MG) BY MOUTH DAILY  . Zinc 100 MG TABS Take by mouth.   No facility-administered encounter medications on file as of 03/07/2021.    Objective:   PHYSICAL EXAMINATION:    VITALS:   Vitals:   03/07/21 1502  BP: (!) 110/58  Pulse: 83  SpO2: 98%  Weight: 210 lb (95.3 kg)  Height: _0  (1.575 m)    GEN:  The patient appears stated age and is in NAD. HEENT:  Normocephalic, atraumatic.  The mucous membranes are moist. The superficial temporal arteries are without ropiness or tenderness. CV:  RRR Lungs:  CTAB Neck/HEME:  There are no carotid bruits bilaterally.  Neurological examination:  Orientation: The patient is alert and oriented x3. Cranial nerves: There is good facial symmetry with min facial hypomimia. The speech is fluent and clear. Soft palate rises symmetrically and there is no tongue deviation.  Hearing is intact to conversational tone. Sensation: Sensation is intact to light touch throughout Motor: Strength is at least antigravity x4.  Movement examination: Tone: There is normal tone in the UE/LE Abnormal movements: there is RUE rest tremor and more rare right lower extremity rest tremor.  Did not see any posturing of the right foot today Coordination:  There is no decremation with RAM's, with any form of RAMS, including alternating supination and pronation of the forearm, hand opening  and closing, finger taps, heel taps and toe taps. Gait and Station: The patient has no difficulty arising out of a deep-seated chair without the use of the hands. The patient's stride length is slightly decreased and she slightly drags the right leg  I have reviewed and interpreted the following labs independently    Chemistry      Component Value Date/Time   NA 140 10/25/2010 0515   K 4.1 10/25/2010 0515   CL 112 10/25/2010 0515   CO2 24 10/25/2010 0515   BUN 10 10/25/2010 0515   CREATININE 0.86 10/25/2010 0515      Component Value Date/Time   CALCIUM 8.6 10/25/2010 0515   ALKPHOS 83 07/17/2018 1020   AST 22 07/17/2018 1020   ALT 18 07/17/2018 1020   BILITOT 0.4 07/17/2018 1020       Lab Results  Component Value Date   WBC 9.0 10/25/2010   HGB 11.5 (L) 10/25/2010   HCT 35.0 (L) 10/25/2010   MCV 87.1 10/25/2010   PLT 287 10/25/2010    Lab Results  Component Value Date   TSH 2.765 10/23/2010     Total time spent on today's visit was 20 minutes, including both face-to-face time and nonface-to-face time.  Time included that spent on review of records (prior notes available to me/labs/imaging if pertinent), discussing treatment and goals, answering patient's questions and coordinating care.  Cc:  Carol Ada, MD

## 2021-03-07 ENCOUNTER — Encounter: Payer: Self-pay | Admitting: Neurology

## 2021-03-07 ENCOUNTER — Ambulatory Visit: Payer: PPO | Admitting: Neurology

## 2021-03-07 ENCOUNTER — Other Ambulatory Visit: Payer: Self-pay

## 2021-03-07 VITALS — BP 110/58 | HR 83 | Ht 62.0 in | Wt 210.0 lb

## 2021-03-07 DIAGNOSIS — G20 Parkinson's disease: Secondary | ICD-10-CM | POA: Diagnosis not present

## 2021-03-07 NOTE — Patient Instructions (Signed)
T-Shirt design contest   SUBMIT UNIQUE DESIGN TO WIN!  Winning design will be featured on T-shirts to support our local Parkinson's patients.   Contest rules and information in on link below .   Contest ends on August 31,2022    How to [employer] your own unique quote or a design that will stand out and support the ideas behind Parkinson's Awareness  This contest is open to everyone. The winning design or quote will be selected by a group of unbiased judges. Judges will vote based on quality, relevance, and aesthetic. Judges will not have access to the names associated with the submission. Submissions accepted until: June 21, 2021 What You Win Your own quote or design will be printed on a t-shirt that will support Parkinson's Awareness!! Feel good knowing your quote/artwork will help spread awareness for Parkinson's contribute and 100% of profits to the local area . Tips for Submissions We will accept submissions in the form of quotes, Financial risk analyst, or hand drawn artwork.  Mission/Theme: The Power of One ! Working together to support and treat to improve quality of life for Parkinson's Patients Rules & Regulations All designs and quotes must be original and not subject to copyright of another person or entity. Distribution and Reproduction Rights for all quotes and artwork will be transferred to [facility] [employer] upon submission to the contest via the [employer] form on June 21, 2021   Online Resources for Power over Parkinson's Group May 2022  . Local  Online Groups  o Power over [employer] :   - Power Over Parkinson's Patient Education Group will be Wednesday, May 11th at 2pm via Zoom.   - Upcoming Power over Parkinson's Meetings:  2nd Wednesdays of the month at 2 pm:  June 8th, July 13th - Contact Amy Marriott at [email or web address] if interested in participating in this online group o Parkinson's Care Partners Group:     3rd Mondays, Contact Misty Paladino o Atypical Parkinsonian Patient Group:   4th Wednesdays, Contact Misty Paladino o If you are interested in participating in these online groups with Misty, please contact her directly for how to join those meetings.  Her contact information is [email or web address].   . [place]:  [email or web address] o PD Health at Home continues:  Mindfulness Mondays, Expert Briefing Tuesdays, Wellness Wednesdays, Take Time Thursdays, Fitness Fridays -Listings for May 2022 are on the website o Upcoming Webinar:  Newly Diagnosed Building a Better Life with Parkinson   Wednesday, May 18th @ 1 pm o Register for Armed forces operational officer) at [email or web address] o  Please check out their website to sign up for emails and see their full online offerings  . [place]:  [email or web address]  o Upcoming Webinar:   What's in your DNA, understanding Parkinson's genetics.  Thursday, May 19th @ 12 noon o Check out additional information on their website to see their full online offerings  . [place]:  [email or web address] o Upcoming Webinar:  Stay tuned o Care Partner Monthly Meetup.  With [place].  First Tuesday of each month, 2 pm o Joy Breaks:  First Wednesday of each month, 2-3 pm. There will be art, doodling, making, crafting, listening, laughing, stories, and everything in between. No art experience necessary. No supplies required. Just show up for joy!  Register on their website. o Check out additional information to Live Well Today on their website  . Parkinson and Movement Disorders (PMD) Alliance:  www.pmdalliance.org o [employer] Online:  Online  Education Events o Sign up for emails, which are sent weekly to give you updates on programming and online offerings     . Parkinson's Association of the [place]:  www.[email or web address] o Information on online support groups,  education events, and online exercises including Yoga, Parkinson's exercises and more-LOTS of information on links to PD resources and online events o Virtual Support Group through Parkinson's Association of the [place]; next one is scheduled for Wednesday, May 4th, 2022 at 2 pm. (These are typically scheduled for the 1st Wednesday of the month at 2 pm).  Visit website for details.  . Additional links for movement activities: o PWR! Moves Classes at [place] RESUMED!  Wednesdays 10 and 11 am.  Contact Amy Marriott, PT [email or web address] or [phone] if interested o Here is a link to the PWR!Moves classes on Zoom from [place] - Daily Mon-Sat at 10:00. Via Zoom, FREE and open to all.  There is also a link below via Facebook if you use that platform. - [email or web address].si - [email or web address]://[email or web address].no o Parkinson's Wellness Recovery (PWR! Moves)  www.[email or web address] - Info on the PWR! Virtual Experience:  You will have access to our expertise through self-assessment, guided plans that start with the PD-specific fundamentals, educational content, tips, Q&A with an expert, and a growing Art therapist of PD-specific pre-recorded and live exercise classes of varying types and intensity - both physical and cognitive! If that is not enough, we offer 1:1 wellness consultations (in-person or virtual) to personalize your PWR! Research scientist (medical).  - Check out the PWR! Move of the month on the [place] [facility] website:  [email or web address] o [employer] Fridays:  - As part of the PD Health @ Home program, this free video series focuses each week on one aspect of fitness designed to support people living with Parkinson's.  These weekly  videos highlight the [place] recent fitness guidelines for people with Parkinson's disease. -  [email or web address].dk o Dance for PD website is offering free, live-stream classes throughout the week, as well as links to [employer] of classes:  [email or web address] o Dance for Parkinson's Class:  [place].  Free offering for people with Parkinson's and care partners; virtual class.  o For more information, contact [phone] or email Ruffin Frederick at [email or web address] o Virtual dance and Pilates for Parkinson's classes: Click on the Community Tab> Parkinson's Movement Initiative Tab.  To register for classes and for more information, visit www.[email or web address] and click the "community" tab.     o [employer] Parkinson's Cycling Classes  - [facility] [employer]: 1pm on Fridays-Live classes at [employer] ([employer] at [email or web address] or [phone]) [facility] [employer]: Virtual Classes Mondays and Thursdays Jeanette Caprice classes Tuesday, Wednesday and Thursday (contact [place] at [email or web address]  or [phone])  o [place] levels of classes are offered Tuesdays and Thursdays:  10:30 am,  12 noon & 1:45 pm at [facility].  - Active Stretching with Paula Compton Class starting in March, on Fridays - To observe a class or for  more information, call [phone] or email [email or web address] . Well[employer]Spring [employer]: o Chief Technology Officer Opportunities:  www.[email or web address]/caregiver-education/[email or web address]-support-group.  You may also contact Vickki Muff at [email or web address] or [phone].   o [facility] for Family Caregivers! Thursday, May 12th 10:00a-1:30p [place] 11-18-1997, [employer], [place], [place] You may contact [address] at  [email or web address] or [phone].   o Well-Spring Navigator:  Just1Navigator program, a free service to help individuals  and families through the journey of determining care for older adults.  The "Navigator" is a Education officer, museum, Arnell Asal, who will speak with a prospective client and/or loved ones to provide an assessment of the situation and a set of recommendations for a personalized care plan -- all free of charge, and whether Well[facility]Spring [employer] offers the needed service or not. If the need is not a service we provide, we are well-connected with reputable programs in town that we can refer you to.  www.well-[email or web address] or to speak with the Navigator, call [phone].

## 2021-04-18 ENCOUNTER — Ambulatory Visit: Payer: Self-pay | Admitting: Podiatry

## 2021-04-25 ENCOUNTER — Other Ambulatory Visit: Payer: Self-pay | Admitting: Cardiovascular Disease

## 2021-04-27 ENCOUNTER — Ambulatory Visit: Payer: PPO | Admitting: Podiatry

## 2021-04-27 ENCOUNTER — Other Ambulatory Visit: Payer: Self-pay

## 2021-04-27 ENCOUNTER — Other Ambulatory Visit: Payer: Self-pay | Admitting: Cardiovascular Disease

## 2021-04-27 DIAGNOSIS — L600 Ingrowing nail: Secondary | ICD-10-CM

## 2021-04-27 MED ORDER — NEOMYCIN-POLYMYXIN-HC 1 % OT SOLN: OTIC | 1 refills | Status: DC

## 2021-04-27 NOTE — Patient Instructions (Signed)

## 2021-04-28 NOTE — Progress Notes (Signed)
Subjective:  Patient ID: Julie Carey, female    DOB: 12-29-1950,  MRN: [id number] HPI No chief complaint on file.   70 y.o. female presents with the above complaint.   ROS: Denies fever chills nausea vomiting muscle aches pains calf pain back pain chest pain shortness of breath.  Past Medical History:  Diagnosis Date   Coronary artery disease    Family history of heart disease    Hyperlipidemia    Hypertension    Past Surgical History:  Procedure Laterality Date   CATARACT EXTRACTION, BILATERAL     CORONARY ANGIOPLASTY WITH STENT PLACEMENT  10/24/2010   mid circ.AV groove 80%narrowing, 205X34mm Promus Element stent , entire region reduced to 0%   NM MYOCAR PERF WALL MOTION  12/27/2010   protocol:Bruce, EF 74%,no evidence of ischemia, exercise cap. 7 METS   TRANSTHORACIC ECHOCARDIOGRAM  10/23/2010   Ef 50-55%,all cavity sizes normal, wall thickness normal, no regurg. n    Current Outpatient Medications:    NEOMYCIN-POLYMYXIN-HYDROCORTISONE (CORTISPORIN) 1 % SOLN OTIC solution, Apply 1-2 drops to toe BID after soaking, Disp: 10 mL, Rfl: 1   aspirin 81 MG tablet, Take 81 mg by mouth daily. , Disp: , Rfl:    calcium carbonate (OSCAL) 1500 (600 Ca) MG TABS tablet, Take 1 tablet by mouth daily with breakfast., Disp: , Rfl:    Carbidopa-Levodopa ER (SINEMET CR) 25-100 MG tablet controlled release, Take 1 tablet by mouth 3 (three) times daily. 7am/11am/4pm, Disp: 270 tablet, Rfl: 1   citalopram (CELEXA) 10 MG tablet, Take 10 mg by mouth at bedtime., Disp: , Rfl:    clopidogrel (PLAVIX) 75 MG tablet, TAKE 1 TABLET(75 MG) BY MOUTH DAILY, Disp: 30 tablet, Rfl: 0   levothyroxine (SYNTHROID, LEVOTHROID) 75 MCG tablet, TAKE 1 TABLET BY MOUTH DAILY BEFORE BREAKFAST, Disp: 90 tablet, Rfl: 3   lisinopril (PRINIVIL,ZESTRIL) 10 MG tablet, Take 1 tablet (10 mg total) by mouth daily., Disp: 90 tablet, Rfl: 3   LORazepam (ATIVAN) 0.5 MG tablet, Take 1 tablet by mouth daily as needed., Disp: , Rfl:     metoprolol tartrate (LOPRESSOR) 25 MG tablet, TAKE 1 TABLET(25 MG) BY MOUTH TWICE DAILY, Disp: 180 tablet, Rfl: 1   Multiple Vitamin (MULTIVITAMIN) tablet, Take 1 tablet by mouth daily., Disp: , Rfl:    pramipexole (MIRAPEX) 0.5 MG tablet, Take 1 tablet (0.5 mg total) by mouth 3 (three) times daily., Disp: 270 tablet, Rfl: 0   simvastatin (ZOCOR) 40 MG tablet, TAKE 1 TABLET(40 MG) BY MOUTH DAILY, Disp: 90 tablet, Rfl: 3   Zinc 100 MG TABS, Take by mouth., Disp: , Rfl:   No Known Allergies Review of Systems Objective:  There were no vitals filed for this visit.  General: Well developed, nourished, in no acute distress, alert and oriented x3   Dermatological: Skin is warm, dry and supple bilateral. Nails x 10 are well maintained; remaining integument appears unremarkable at this time. There are no open sores, no preulcerative lesions, no rash or signs of infection present.  She has painful thick dystrophic nails without signs of bacterial infection to toes #3 #4 of the right foot.  Exquisitely tender on palpation.  Vascular: Dorsalis Pedis artery and Posterior Tibial artery pedal pulses are 2/4 bilateral with immedate capillary fill time. Pedal hair growth present. No varicosities and no lower extremity edema present bilateral.   Neruologic: Grossly intact via light touch bilateral. Vibratory intact via tuning fork bilateral. Protective threshold with Semmes Wienstein monofilament intact to all pedal sites  bilateral. Patellar and Achilles deep tendon reflexes 2+ bilateral. No Babinski or clonus noted bilateral.   Musculoskeletal: No gross boney pedal deformities bilateral. No pain, crepitus, or limitation noted with foot and ankle range of motion bilateral. Muscular strength 5/5 in all groups tested bilateral.  Gait: Unassisted, Nonantalgic.    Radiographs:  None taken  Assessment & Plan:   Assessment: Painful nail dystrophy third and fourth digits of the right foot.  Plan: Total  nail avulsions with chemical matricectomy was performed to toes #3 #4 of the right foot.  She tolerated this well after local anesthetic was administered.  She was given both oral and written home-going instruction for the care and soaking of the toe.  She was provided with a prescription for Cortisporin Otic which was sent over electronically.  She will apply this twice daily after soaking.  She will cover with a Band-Aid twice daily after soaking and follow-up with me in 2 weeks     Mashawn Brazil T. [place], [place]

## 2021-05-11 ENCOUNTER — Ambulatory Visit: Payer: PPO | Admitting: Podiatry

## 2021-05-11 ENCOUNTER — Other Ambulatory Visit: Payer: Self-pay

## 2021-05-11 ENCOUNTER — Encounter: Payer: Self-pay | Admitting: Podiatry

## 2021-05-11 DIAGNOSIS — L03031 Cellulitis of right toe: Secondary | ICD-10-CM

## 2021-05-11 MED ORDER — DOXYCYCLINE HYCLATE 100 MG PO TABS: 100.0000 mg | ORAL_TABLET | Freq: Two times a day (BID) | ORAL | 1 refills | Status: DC

## 2021-05-13 NOTE — Progress Notes (Signed)
She presents today for follow-up of her matricectomy's third and fourth toes of the right foot.  States that it does not look too good but I guess it is okay.  Continues to soak regularly.  Objective: Signs are stable alert and oriented x3.  There is mild erythema some fibrin deposition granulation tissue and epithelialization occurring over the third toe and fourth toe of the right foot.  Mildly tender on palpation.  No significant odor.  Assessment: Mild paronychia status post matrixectomy's third and fourth digits of the right foot.  Plan: At this point I will start her on doxycycline and encouraged her to soak Epson salts and warm water once a day or once every other day.  She will continue to cover during the day but leave open at bedtime to allow air to help dry the wounds.  Follow-up with her in 3 weeks

## 2021-05-29 ENCOUNTER — Other Ambulatory Visit: Payer: Self-pay | Admitting: Cardiovascular Disease

## 2021-06-01 ENCOUNTER — Encounter: Payer: Self-pay | Admitting: Podiatry

## 2021-06-01 ENCOUNTER — Ambulatory Visit: Payer: PPO | Admitting: Podiatry

## 2021-06-01 ENCOUNTER — Other Ambulatory Visit: Payer: Self-pay

## 2021-06-01 DIAGNOSIS — M1712 Unilateral primary osteoarthritis, left knee: Secondary | ICD-10-CM | POA: Diagnosis not present

## 2021-06-01 DIAGNOSIS — M170 Bilateral primary osteoarthritis of knee: Secondary | ICD-10-CM | POA: Diagnosis not present

## 2021-06-01 DIAGNOSIS — L03031 Cellulitis of right toe: Secondary | ICD-10-CM

## 2021-06-01 DIAGNOSIS — M1711 Unilateral primary osteoarthritis, right knee: Secondary | ICD-10-CM | POA: Diagnosis not present

## 2021-06-03 NOTE — Progress Notes (Signed)
She presents today for follow-up of her matricectomy's third and fourth toes of the right foot.  States that is some better just taking a while and just does not look that great.  Objective: Vital signs stable alert oriented x3 the third and fourth nail beds right foot demonstrate no erythema cellulitis drainage or odor dry skin appears to be healing very nicely there is some scab still present but otherwise it seems to be going on to heal normally.  Assessment: Well-healing surgical toes fourth and third toe right.  Plan: I encouraged her to continue to soak about every other day Epson salts and warm water she can cover them during the day otherwise just leave them open at night.  Follow-up with me in a couple of weeks if not 100% improved.

## 2021-06-11 ENCOUNTER — Other Ambulatory Visit: Payer: Self-pay | Admitting: Neurology

## 2021-06-14 ENCOUNTER — Telehealth: Payer: Self-pay | Admitting: Neurology

## 2021-06-14 MED ORDER — CARBIDOPA-LEVODOPA ER 25-100 MG PO TBCR: 1.0000 | EXTENDED_RELEASE_TABLET | Freq: Three times a day (TID) | ORAL | 1 refills | Status: DC

## 2021-06-14 NOTE — Telephone Encounter (Signed)
I sent the medication to [facility]

## 2021-06-14 NOTE — Telephone Encounter (Signed)
Pt called in and left a message needing a refill of her carbidopa-levodopa. She is completely out.

## 2021-06-15 ENCOUNTER — Other Ambulatory Visit: Payer: Self-pay

## 2021-08-17 DIAGNOSIS — E669 Obesity, unspecified: Secondary | ICD-10-CM | POA: Diagnosis not present

## 2021-08-17 DIAGNOSIS — I2510 Atherosclerotic heart disease of native coronary artery without angina pectoris: Secondary | ICD-10-CM | POA: Diagnosis not present

## 2021-08-17 DIAGNOSIS — E785 Hyperlipidemia, unspecified: Secondary | ICD-10-CM | POA: Diagnosis not present

## 2021-08-17 DIAGNOSIS — Z1389 Encounter for screening for other disorder: Secondary | ICD-10-CM | POA: Diagnosis not present

## 2021-08-17 DIAGNOSIS — K219 Gastro-esophageal reflux disease without esophagitis: Secondary | ICD-10-CM | POA: Diagnosis not present

## 2021-08-17 DIAGNOSIS — F419 Anxiety disorder, unspecified: Secondary | ICD-10-CM | POA: Diagnosis not present

## 2021-08-17 DIAGNOSIS — E039 Hypothyroidism, unspecified: Secondary | ICD-10-CM | POA: Diagnosis not present

## 2021-08-17 DIAGNOSIS — N183 Chronic kidney disease, stage 3 unspecified: Secondary | ICD-10-CM | POA: Diagnosis not present

## 2021-08-17 DIAGNOSIS — I10 Essential (primary) hypertension: Secondary | ICD-10-CM | POA: Diagnosis not present

## 2021-08-17 DIAGNOSIS — Z0000 Encounter for general adult medical examination without abnormal findings: Secondary | ICD-10-CM | POA: Diagnosis not present

## 2021-08-17 DIAGNOSIS — G20 Parkinson's disease: Secondary | ICD-10-CM | POA: Diagnosis not present

## 2021-08-22 ENCOUNTER — Other Ambulatory Visit: Payer: Self-pay | Admitting: Cardiovascular Disease

## 2021-08-23 ENCOUNTER — Other Ambulatory Visit: Payer: Self-pay | Admitting: Cardiovascular Disease

## 2021-08-30 ENCOUNTER — Ambulatory Visit: Payer: PPO | Admitting: Cardiovascular Disease

## 2021-08-30 ENCOUNTER — Other Ambulatory Visit: Payer: Self-pay

## 2021-08-30 ENCOUNTER — Encounter: Payer: Self-pay | Admitting: Cardiovascular Disease

## 2021-08-30 DIAGNOSIS — I2510 Atherosclerotic heart disease of native coronary artery without angina pectoris: Secondary | ICD-10-CM

## 2021-08-30 DIAGNOSIS — E782 Mixed hyperlipidemia: Secondary | ICD-10-CM

## 2021-08-30 DIAGNOSIS — I10 Essential (primary) hypertension: Secondary | ICD-10-CM

## 2021-08-30 NOTE — Assessment & Plan Note (Signed)
History of hyperlipidemia on statin therapy followed by her PCP. 

## 2021-08-30 NOTE — Assessment & Plan Note (Signed)
History of CAD status post non-STEMI 10/24/2010 with stenting of the AV groove circumflex by Dr. Claiborne Billings.  She had insignificant disease in her LAD and RCA.  She denies chest pain.

## 2021-08-30 NOTE — Assessment & Plan Note (Signed)
History of essential hypertension a blood pressure measured today at 100/61.  She is on lisinopril and metoprolol.

## 2021-08-30 NOTE — Patient Instructions (Signed)

## 2021-08-30 NOTE — Progress Notes (Signed)
08/30/2021 Julie Carey   [birth year]  [id number]  Primary Physician Carol Ada, MD Primary Cardiologist: Lorretta Harp MD FACP, [place], [place], [place]  HPI:  Julie Carey is a 70 y.o.  mildly overweight, married Caucasian female, mother of 83, grandmother to 5 grandchildren who I last saw in the office 07/07/2019.  She had a non-STEMI 10/24/2010 and ultimate stenting of her AV groove circumflex by Dr. Ellouise Newer with a drug-eluting stent. She had moderate but noncritical disease in her LAD and RCA. Her risk factors include hyperlipidemia and family history. She had a negative Myoview December 27, 2010. She complains of labile hypertension and some palpitations; however, her blood pressure log over the last several months, after I increased her lisinopril, showed better control of her blood pressure. She did not increase her beta blocker as I had prescribed. Her lipid profile was excellent for secondary prevention and this will be rechecked.  Her primary care physician began her on Lexapro which improved some of her symptoms probably related to anxiety.    Since I saw her in the office 2 years ago she is remained stable.  She does have Parkinson's disease.  She denies chest pain or shortness of breath.   Current Meds  Medication Sig   aspirin 81 MG tablet Take 81 mg by mouth daily.    calcium carbonate (OSCAL) 1500 (600 Ca) MG TABS tablet Take 1 tablet by mouth daily with breakfast.   Carbidopa-Levodopa ER (SINEMET CR) 25-100 MG tablet controlled release Take 1 tablet by mouth 3 (three) times daily. 7am/11am/4pm   citalopram (CELEXA) 10 MG tablet Take 10 mg by mouth at bedtime.   clopidogrel (PLAVIX) 75 MG tablet TAKE 1 TABLET(75 MG) BY MOUTH DAILY   levothyroxine (SYNTHROID, LEVOTHROID) 75 MCG tablet TAKE 1 TABLET BY MOUTH DAILY BEFORE BREAKFAST   lisinopril (PRINIVIL,ZESTRIL) 10 MG tablet Take 1 tablet (10 mg total) by mouth daily.   LORazepam (ATIVAN) 0.5 MG tablet Take 1 tablet by mouth  daily as needed.   metoprolol tartrate (LOPRESSOR) 25 MG tablet TAKE 1 TABLET(25 MG) BY MOUTH TWICE DAILY   Multiple Vitamin (MULTIVITAMIN) tablet Take 1 tablet by mouth daily.   pramipexole (MIRAPEX) 0.5 MG tablet TAKE 1 TABLET(0.5 MG) BY MOUTH THREE TIMES DAILY   simvastatin (ZOCOR) 40 MG tablet TAKE 1 TABLET(40 MG) BY MOUTH DAILY   Zinc 100 MG TABS Take by mouth.     No Known Allergies  Social History   Socioeconomic History   Marital status: Married    Spouse name: Not on file   Number of children: 3   Years of education: Not on file   Highest education level: Not on file  Occupational History    Comment: taxes and bookkeeping  Tobacco Use   Smoking status: Never   Smokeless tobacco: Never  Vaping Use   Vaping Use: Never used  Substance and Sexual Activity   Alcohol use: Never   Drug use: Never   Sexual activity: Not on file  Other Topics Concern   Not on file  Social History Narrative   Right handed   Lives in a one story home    Social Determinants of Health   Financial Resource Strain: Not on file  Food Insecurity: Not on file  Transportation Needs: Not on file  Physical Activity: Not on file  Stress: Not on file  Social Connections: Not on file  Intimate Partner Violence: Not on file     Review  of Systems: General: negative for chills, fever, night sweats or weight changes.  Cardiovascular: negative for chest pain, dyspnea on exertion, edema, orthopnea, palpitations, paroxysmal nocturnal dyspnea or shortness of breath Dermatological: negative for rash Respiratory: negative for cough or wheezing Urologic: negative for hematuria Abdominal: negative for nausea, vomiting, diarrhea, bright red blood per rectum, melena, or hematemesis Neurologic: negative for visual changes, syncope, or dizziness All other systems reviewed and are otherwise negative except as noted above.    Blood pressure 100/61, pulse 67, height 5\' 2"  (1.575 m), weight 211 lb 3.2 oz (95.8  kg), SpO2 98 %.  General appearance: alert and no distress Neck: no adenopathy, no carotid bruit, no JVD, supple, symmetrical, trachea midline, and thyroid not enlarged, symmetric, no tenderness/mass/nodules Lungs: clear to auscultation bilaterally Heart: regular rate and rhythm, S1, S2 normal, no murmur, click, rub or gallop Extremities: extremities normal, atraumatic, no cyanosis or edema Pulses: 2+ and symmetric Skin: Skin color, texture, turgor normal. No rashes or lesions Neurologic: Grossly normal  EKG sinus rhythm at 67 without ST or T wave changes.  Personally reviewed this EKG.  ASSESSMENT AND PLAN:   Coronary artery disease History of CAD status post non-STEMI 10/24/2010 with stenting of the AV groove circumflex by Dr. Claiborne Billings.  She had insignificant disease in her LAD and RCA.  She denies chest pain.  Hyperlipidemia History of hyperlipidemia on statin therapy followed by her PCP.  Essential hypertension History of essential hypertension a blood pressure measured today at 100/61.  She is on lisinopril and metoprolol.     Lorretta Harp MD FACP,FACC,FAHA, [facility] 08/30/2021 11:18 AM

## 2021-08-31 ENCOUNTER — Other Ambulatory Visit: Payer: Self-pay

## 2021-08-31 MED ORDER — CLOPIDOGREL BISULFATE 75 MG PO TABS: ORAL_TABLET | ORAL | 0 refills | Status: DC

## 2021-09-05 ENCOUNTER — Other Ambulatory Visit: Payer: Self-pay | Admitting: Neurology

## 2021-09-05 DIAGNOSIS — G20 Parkinson's disease: Secondary | ICD-10-CM

## 2021-09-06 ENCOUNTER — Other Ambulatory Visit: Payer: Self-pay | Admitting: Cardiovascular Disease

## 2021-09-06 NOTE — Progress Notes (Signed)
Assessment/Plan:   1.  Parkinsons Disease  -increase carbidopa/levodopa 25/100 CR, 2 at 7am, 2 at 11 am, 1 at 4pm  -Continue pramipexole 0.5 mg 3 times per day.  -declines PT.  She will let us know if changes mind  -Handicap placard filled out today.  -Importance of exercise discussed.   2.  PD dyskinesia  -Better with CR version of levodopa.  Haven't seen dyskinesia since we changed  3.  Hypertension  -On multiple antihypertensives  -Follows with cardiology  -will need to watch this closely with course of time due to risk of Neurogenic Orthostatic Hypotension with Parkinsons Disease   4.  Right foot dystonia  -Was referred to Dr. Letta Pate for possible Botox.  He recommended she try physical therapy first, but she did not follow back up with them.  Encouraged her to do so if dystonia sx's increase and she agreed, but seems to be stable for now.   Subjective:   Julie Carey was seen today in follow up for Parkinsons disease.  My previous records were reviewed prior to todays visit as well as outside records available to me. Pt denies falls.  Pt denies lightheadedness, near syncope.  Saw Dr. Gwenlyn Found on 11/9 for her CAD and labile BP.  They had increased her lisinopril previously and her BP was 100/61 at his last visit, on metoprolol and lisinopril.  No hallucinations.  Mood has been good.   She notes some "slowing down" esp with manipulations of the R arm.  She is moving a little slower.  Right foot dystonia seems to be doing fairly well.  Current prescribed movement disorder medications: Carbidopa/levodopa 25/100 CR, 1 tablet 3 times per day Pramipexole 0.5 mg 3 times per day.   PREVIOUS MEDICATIONS: carbidopa/levodopa 25/100 IR (changed because of dyskinesia)  ALLERGIES:  No Known Allergies  CURRENT MEDICATIONS:  Outpatient Encounter Medications as of 09/08/2021  Medication Sig   aspirin 81 MG tablet Take 81 mg by mouth daily.    calcium carbonate (OSCAL) 1500 (600 Ca)  MG TABS tablet Take 1 tablet by mouth daily with breakfast.   citalopram (CELEXA) 10 MG tablet Take 10 mg by mouth at bedtime.   clopidogrel (PLAVIX) 75 MG tablet TAKE 1 TABLET(75 MG) BY MOUTH DAILY   levothyroxine (SYNTHROID, LEVOTHROID) 75 MCG tablet TAKE 1 TABLET BY MOUTH DAILY BEFORE BREAKFAST   lisinopril (PRINIVIL,ZESTRIL) 10 MG tablet Take 1 tablet (10 mg total) by mouth daily.   LORazepam (ATIVAN) 0.5 MG tablet Take 1 tablet by mouth daily as needed.   metoprolol tartrate (LOPRESSOR) 25 MG tablet TAKE 1 TABLET(25 MG) BY MOUTH TWICE DAILY   Multiple Vitamin (MULTIVITAMIN) tablet Take 1 tablet by mouth daily.   pramipexole (MIRAPEX) 0.5 MG tablet TAKE 1 TABLET(0.5 MG) BY MOUTH THREE TIMES DAILY   simvastatin (ZOCOR) 40 MG tablet TAKE 1 TABLET(40 MG) BY MOUTH DAILY   Zinc 100 MG TABS Take by mouth.   [DISCONTINUED] Carbidopa-Levodopa ER (SINEMET CR) 25-100 MG tablet controlled release Take 1 tablet by mouth 3 (three) times daily. 7am/11am/4pm   Carbidopa-Levodopa ER (SINEMET CR) 25-100 MG tablet controlled release 2 at 7am, 2 at 11 am, 1 at 4pm   [DISCONTINUED] doxycycline (VIBRA-TABS) 100 MG tablet Take 1 tablet (100 mg total) by mouth 2 (two) times daily.   [DISCONTINUED] NEOMYCIN-POLYMYXIN-HYDROCORTISONE (CORTISPORIN) 1 % SOLN OTIC solution Apply 1-2 drops to toe BID after soaking   No facility-administered encounter medications on file as of 09/08/2021.    Objective:  PHYSICAL EXAMINATION:    VITALS:   Vitals:   09/08/21 1456  BP: (!) 132/59  Pulse: 72  SpO2: 97%  Weight: 210 lb 12.8 oz (95.6 kg)  Height: 5\' 6"  (1.676 m)     GEN:  The patient appears stated age and is in NAD. HEENT:  Normocephalic, atraumatic.  The mucous membranes are moist. The superficial temporal arteries are without ropiness or tenderness. CV:  RRR Lungs:  CTAB Neck/HEME:  There are no carotid bruits bilaterally.  Neurological examination:  Orientation: The patient is alert and oriented  x3. Cranial nerves: There is good facial symmetry with min facial hypomimia. The speech is fluent and clear. Soft palate rises symmetrically and there is no tongue deviation. Hearing is intact to conversational tone. Sensation: Sensation is intact to light touch throughout Motor: Strength is at least antigravity x4.  Movement examination: Tone: There is normal tone in the UE/LE Abnormal movements: there is RUE rest tremor and more rare right lower extremity rest tremor.  Did not see any posturing of the right foot today Coordination:  There is mild decremation, with any form of RAMS, including alternating supination and pronation of the forearm, hand opening and closing, finger taps, heel taps and toe taps on the R Gait and Station: The patient has no difficulty arising out of a deep-seated chair without the use of the hands. The patient's stride length is decreased and she slightly drags the right leg.  She is short stepped.    I have reviewed and interpreted the following labs independently    Chemistry      Component Value Date/Time   NA 140 10/25/2010 0515   K 4.1 10/25/2010 0515   CL 112 10/25/2010 0515   CO2 24 10/25/2010 0515   BUN 10 10/25/2010 0515   CREATININE 0.86 10/25/2010 0515      Component Value Date/Time   CALCIUM 8.6 10/25/2010 0515   ALKPHOS 83 07/17/2018 1020   AST 22 07/17/2018 1020   ALT 18 07/17/2018 1020   BILITOT 0.4 07/17/2018 1020       Lab Results  Component Value Date   WBC 9.0 10/25/2010   HGB 11.5 (L) 10/25/2010   HCT 35.0 (L) 10/25/2010   MCV 87.1 10/25/2010   PLT 287 10/25/2010    Lab Results  Component Value Date   TSH 2.765 10/23/2010     Total time spent on today's visit was 30 minutes, including both face-to-face time and nonface-to-face time.  Time included that spent on review of records (prior notes available to me/labs/imaging if pertinent), discussing treatment and goals, answering patient's questions and coordinating  care.  Cc:  Carol Ada, MD

## 2021-09-08 ENCOUNTER — Ambulatory Visit: Payer: PPO | Admitting: Neurology

## 2021-09-08 ENCOUNTER — Other Ambulatory Visit: Payer: Self-pay

## 2021-09-08 ENCOUNTER — Encounter: Payer: Self-pay | Admitting: Neurology

## 2021-09-08 VITALS — BP 132/59 | HR 72 | Ht 66.0 in | Wt 210.8 lb

## 2021-09-08 DIAGNOSIS — I10 Essential (primary) hypertension: Secondary | ICD-10-CM

## 2021-09-08 DIAGNOSIS — G249 Dystonia, unspecified: Secondary | ICD-10-CM

## 2021-09-08 DIAGNOSIS — G20 Parkinson's disease: Secondary | ICD-10-CM | POA: Diagnosis not present

## 2021-09-08 MED ORDER — CARBIDOPA-LEVODOPA ER 25-100 MG PO TBCR: EXTENDED_RELEASE_TABLET | ORAL | 1 refills | Status: DC

## 2021-09-08 NOTE — Patient Instructions (Addendum)
Take carbidopa/levodopa 25/100, 2 at 7am, 2 at 11 am, 1 at 4pm No change in the pramipexole Let me know if you change your mind on PT  Online Resources for Power over Parkinson's Group November 2022  [place] over [employer] Group :   [employer] Over Parkinson's Patient Education Group will be Wednesday, November 9th-*Hybrid meting*- in person at [place] location and via [facility] at 2:00 pm.   Upcoming [employer] over [employer] Meetings:  2nd Wednesdays of the month at 2 pm:  November 9th, December 14th [place] at [email or web address] if interested in participating in this online group Parkinson's Care Partners Group:    3rd Mondays, Contact Misty Paladino Atypical Parkinsonian Patient Group:   4th Wednesdays, Contact Misty Paladino If you are interested in participating in these online groups with Misty, please contact her directly for how to join those meetings.  Her contact information is [email or web address].   [place]:  [email or web address] PD Health at Home continues:  Mindfulness Mondays, Expert Briefing Tuesdays, Wellness Wednesdays, Take Time Thursdays, Fitness Fridays -Listings for June 2022 are on the website Upcoming Webinar:  Expert Briefing:  Let's Talk about Dementia.  Wednesday, November 2nd  at 1 pm. Register for Armed forces operational officer) at [email or web address]  Please check out their website to sign up for emails and see their full online offerings  [place]:  [email or web address]  Upcoming Webinar:   2022 in Review:  Progress Toward Better Treatment and Prevention.  Thursday, November 17th at 12 noon Check out additional information on their website to see their full online offerings  [place]:  [email or web address] Upcoming Webinar:  NOH (Neurogenic Orthostatic Hypotension):  What it  is, How to Know if you Have it, and How to Manage it.  Tuesday, November 15th at 3 pm.  Webinar Series:  Living with Parkinson's Meetup.   Third Thursdays of each month at 3 pm; next one is November 17th Care Partner Monthly Meetup.  With Robin Searing Phinney.  First Tuesday of each month, 2 pm Joy Breaks:  First Wednesday of each month, 2-3 pm. There will be art, doodling, making, crafting, listening, laughing, stories, and everything in between. No art experience necessary. No supplies required. Just show up for joy!  Register on their website. Check out additional information to Live Well Today on their website  Parkinson and Movement Disorders (PMD) Alliance:  www.[email or web address] [employer] Online:  Online Education Events Sign up for emails, which are sent weekly to give you updates on programming and online offerings  Parkinson's Association of the [place]:  www.[email or web address] Information on online support groups, education events, and online exercises including Yoga, Parkinson's exercises and more-LOTS of information on links to PD resources and online events Virtual Support Group through Parkinson's Association of the [place]; next one is scheduled for Wednesday, November 2nd  at 2 pm.  (These are typically scheduled for the 1st Wednesday of the month at 2 pm).  Visit website for details.  Additional links for movement activities: Parkinson's DRUMMING Classes/Music Therapy with Doylene Canning:  This is a returning class and it's FREE!  2nd Mondays, continuing November 14th.  Contact *Misty Gal-Paladino at [email or web address] or Doylene Canning at [phone] or [email or web address]  PWR! Moves Classes at [place].  Wednesdays 10 and 11 am.  Contact Amy Marriott, PT [email or web address] if interested. Here is a link to the PWR!Moves classes on Zoom from [place] - Daily Mon-Sat at 10:00. Via Zoom, FREE  and open to all.  There is also a link below via Facebook if you use that platform. [email or web address] Parkinson's Wellness Recovery (PWR! Moves)  www.[email or web address] Info on the PWR! Virtual Experience:  You will have access to our expertise through self-assessment, guided plans that start with the PD-specific fundamentals, educational content, tips, Q&A with an expert, and a growing Art therapist of PD-specific pre-recorded and live exercise classes of varying types and intensity - both physical and cognitive! If that is not enough, we offer 1:1 wellness consultations (in-person or virtual) to personalize your PWR! Research scientist (medical).  [place] Fridays:  As part of the PD Health @ Home program, this free video series focuses each week on one aspect of fitness designed to support people living with Parkinson's.  These weekly videos highlight the [place] recent fitness guidelines for people with Parkinson's disease.  HollywoodSale.dk Dance for PD website is offering free, live-stream classes throughout the week, as well as links to [employer] of classes:  [email or web address]/ Dance for Parkinson's Class:  [place].  Free offering for people with Parkinson's and care partners; virtual class.  For more information, contact [phone] or email Ruffin Frederick at [email or web address] Virtual dance and Pilates for Parkinson's classes: Click on the Community Tab> Parkinson's Movement Initiative Tab.  To register for classes and for more information, visit www.[email or web address] and click the "community" tab.  [employer] Parkinson's Cycling Classes  [employer]: 1pm on  Fridays-Live classes at [employer] ([employer] at [email or web address] or [phone]) [employer]: Virtual Classes Mondays and Thursdays Jeanette Caprice classes Tuesday, Wednesday and Thursday (contact [place] at [email or web address]  or [phone]) [place] Varied levels of classes are offered Mondays, Tuesdays and Thursdays at [employer].  To observe a class or for more information, call [phone] or email [email or web address] Well[employer]Spring [employer]: Online Caregiver Education Opportunities:  www.well-[email or web address]/caregiver-education/[email or web address]-support-group.  You may also contact Vickki Muff at [email or web address] or [phone].   *Multiple opportunities below, as November is Caregiver Month!* A Guide to Physical and Mental Fitness for Family Caregivers.  Wednesday, November 9th, 12:30-2 pm at [facility] of You!  Tuesday, November 15th, 11:30-12:45.  [facility], [place] 04-27-1993 Understanding Care Options and Advanced Care Planning.  Monday, November 21st, 4:30-5:45.  [place], [place] above to register. Well-Spring Navigator:  [address] program, a free service to help individuals and families through the journey of determining care for older adults.  The "Navigator" is a [employer], Education officer, museum, who will speak with a prospective client and/or loved ones to provide an assessment of the situation and a set of recommendations for a personalized care plan -- all free of charge, and whether Well-Spring [employer] offers the needed service or not. If the need is not a service we provide, we are well-connected with reputable programs in town that we can refer you to.  www.well-[email or web address] or to speak with the Navigator, call [phone].

## 2021-11-29 ENCOUNTER — Other Ambulatory Visit: Payer: Self-pay | Admitting: Cardiovascular Disease

## 2021-12-02 ENCOUNTER — Other Ambulatory Visit: Payer: Self-pay | Admitting: Neurology

## 2021-12-02 DIAGNOSIS — G20 Parkinson's disease: Secondary | ICD-10-CM

## 2021-12-04 ENCOUNTER — Other Ambulatory Visit: Payer: Self-pay

## 2022-01-25 ENCOUNTER — Other Ambulatory Visit: Payer: Self-pay | Admitting: Cardiovascular Disease

## 2022-02-08 DIAGNOSIS — Z1231 Encounter for screening mammogram for malignant neoplasm of breast: Secondary | ICD-10-CM | POA: Diagnosis not present

## 2022-02-20 DIAGNOSIS — E785 Hyperlipidemia, unspecified: Secondary | ICD-10-CM | POA: Diagnosis not present

## 2022-02-20 DIAGNOSIS — E039 Hypothyroidism, unspecified: Secondary | ICD-10-CM | POA: Diagnosis not present

## 2022-02-20 DIAGNOSIS — I2510 Atherosclerotic heart disease of native coronary artery without angina pectoris: Secondary | ICD-10-CM | POA: Diagnosis not present

## 2022-02-20 DIAGNOSIS — G20 Parkinson's disease: Secondary | ICD-10-CM | POA: Diagnosis not present

## 2022-02-20 DIAGNOSIS — F419 Anxiety disorder, unspecified: Secondary | ICD-10-CM | POA: Diagnosis not present

## 2022-02-20 DIAGNOSIS — I10 Essential (primary) hypertension: Secondary | ICD-10-CM | POA: Diagnosis not present

## 2022-03-03 ENCOUNTER — Other Ambulatory Visit: Payer: Self-pay | Admitting: Neurology

## 2022-03-03 ENCOUNTER — Other Ambulatory Visit: Payer: Self-pay | Admitting: Cardiovascular Disease

## 2022-03-03 DIAGNOSIS — G20 Parkinson's disease: Secondary | ICD-10-CM

## 2022-03-05 ENCOUNTER — Other Ambulatory Visit: Payer: Self-pay

## 2022-03-13 NOTE — Progress Notes (Signed)
Assessment/Plan:   1.  Parkinsons Disease  -Continue carbidopa/levodopa 25/100 CR, 2 at 7am, 2 at 11 am, 1 at 4pm  -Continue pramipexole 0.5 mg 3 times per day.  -We discussed that it used to be thought that levodopa would increase risk of melanoma but now it is believed that Parkinsons itself likely increases risk of melanoma. she is to get regular skin checks.  -discussed PT.  She will let me know if she wants a [facility].   2.  PD dyskinesia  -Better with CR version of levodopa.  Haven't seen dyskinesia since we changed  3.  Hypertension  -On multiple antihypertensives  -Follows with cardiology  -will need to watch this closely with course of time due to risk of Neurogenic Orthostatic Hypotension with Parkinsons Disease   4.  Right foot dystonia  -Was referred to Dr. Letta Pate for possible Botox.  He recommended she try physical therapy first, but she did not follow back up with them.  Encouraged her to do so if dystonia sx's increase and she agreed, but seems to be stable for now.   Subjective:   Julie Carey was seen today in follow up for Parkinsons disease.  My previous records were reviewed prior to todays visit as well as outside records available to me.  Levodopa was increased last visit.  She is tolerating it well, without side effects.  Pt denies falls.  Pt denies lightheadedness, near syncope.  No hallucinations.  Mood has been good.  She is riding her bike for exercise (didn't do anything during tax season and is working on getting back to that).  Current prescribed movement disorder medications: Carbidopa/levodopa 25/100 CR, 2/2/1 (increased last visit from 1 tablet 3 times per day) Pramipexole 0.5 mg 3 times per day.   PREVIOUS MEDICATIONS: carbidopa/levodopa 25/100 IR (changed because of dyskinesia)  ALLERGIES:  No Known Allergies  CURRENT MEDICATIONS:  Outpatient Encounter Medications as of 03/20/2022  Medication Sig   aspirin 81  MG tablet Take 81 mg by mouth daily.    calcium carbonate (OSCAL) 1500 (600 Ca) MG TABS tablet Take 1 tablet by mouth daily with breakfast.   Carbidopa-Levodopa ER (SINEMET CR) 25-100 MG tablet controlled release 2 at 7am, 2 at 11 am, 1 at 4pm   citalopram (CELEXA) 10 MG tablet Take 10 mg by mouth at bedtime.   clopidogrel (PLAVIX) 75 MG tablet TAKE 1 TABLET(75 MG) BY MOUTH DAILY   levothyroxine (SYNTHROID, LEVOTHROID) 75 MCG tablet TAKE 1 TABLET BY MOUTH DAILY BEFORE BREAKFAST   lisinopril (PRINIVIL,ZESTRIL) 10 MG tablet Take 1 tablet (10 mg total) by mouth daily.   LORazepam (ATIVAN) 0.5 MG tablet Take 1 tablet by mouth daily as needed.   metoprolol tartrate (LOPRESSOR) 25 MG tablet TAKE 1 TABLET(25 MG) BY MOUTH TWICE DAILY   Multiple Vitamin (MULTIVITAMIN) tablet Take 1 tablet by mouth daily.   pramipexole (MIRAPEX) 0.5 MG tablet TAKE 1 TABLET(0.5 MG) BY MOUTH THREE TIMES DAILY   simvastatin (ZOCOR) 40 MG tablet TAKE 1 TABLET(40 MG) BY MOUTH DAILY   Zinc 100 MG TABS Take by mouth.   No facility-administered encounter medications on file as of 03/20/2022.    Objective:   PHYSICAL EXAMINATION:    VITALS:   Vitals:   03/20/22 1432  BP: 100/66  Pulse: 75  SpO2: 98%  Weight: 214 lb (97.1 kg)  Height: '5\' 6"'$  (1.676 m)      GEN:  The patient appears stated age  and is in NAD. HEENT:  Normocephalic, atraumatic.  The mucous membranes are moist. The superficial temporal arteries are without ropiness or tenderness. CV:  RRR Lungs:  CTAB Neck/HEME:  There are no carotid bruits bilaterally.  Neurological examination:  Orientation: The patient is alert and oriented x3. Cranial nerves: There is good facial symmetry with min facial hypomimia. The speech is fluent and clear. Soft palate rises symmetrically and there is no tongue deviation. Hearing is intact to conversational tone. Sensation: Sensation is intact to light touch throughout Motor: Strength is at least antigravity  x4.  Movement examination: Tone: There is normal tone in the UE/LE Abnormal movements: there is RUE rest tremor  Coordination:  There is no decremation, with any form of RAMS, including alternating supination and pronation of the forearm, hand opening and closing, finger taps, heel taps and toe taps Gait and Station: The patient has no difficulty arising out of a deep-seated chair without the use of the hands. The patient's stride length is just slightly short stepped.  I have reviewed and interpreted the following labs independently    Chemistry      Component Value Date/Time   NA 140 10/25/2010 0515   K 4.1 10/25/2010 0515   CL 112 10/25/2010 0515   CO2 24 10/25/2010 0515   BUN 10 10/25/2010 0515   CREATININE 0.86 10/25/2010 0515      Component Value Date/Time   CALCIUM 8.6 10/25/2010 0515   ALKPHOS 83 07/17/2018 1020   AST 22 07/17/2018 1020   ALT 18 07/17/2018 1020   BILITOT 0.4 07/17/2018 1020       Lab Results  Component Value Date   WBC 9.0 10/25/2010   HGB 11.5 (L) 10/25/2010   HCT 35.0 (L) 10/25/2010   MCV 87.1 10/25/2010   PLT 287 10/25/2010    Lab Results  Component Value Date   TSH 2.765 10/23/2010   Total time spent on today's visit was 20 minutes, including both face-to-face time and nonface-to-face time.  Time included that spent on review of records (prior notes available to me/labs/imaging if pertinent), discussing treatment and goals, answering patient's questions and coordinating care.    Cc:  Carol Ada, MD

## 2022-03-16 ENCOUNTER — Other Ambulatory Visit: Payer: Self-pay | Admitting: Neurology

## 2022-03-20 ENCOUNTER — Ambulatory Visit: Payer: PPO | Admitting: Neurology

## 2022-03-20 ENCOUNTER — Encounter: Payer: Self-pay | Admitting: Neurology

## 2022-03-20 VITALS — BP 100/66 | HR 75 | Ht 66.0 in | Wt 214.0 lb

## 2022-03-20 DIAGNOSIS — G20 Parkinson's disease: Secondary | ICD-10-CM | POA: Diagnosis not present

## 2022-03-20 NOTE — Patient Instructions (Signed)
As we discussed, it used to be thought that levodopa would increase risk of melanoma but now it is believed that Parkinsons itself likely increases risk of melanoma. I recommend yearly skin checks with a board certified dermatologist.  You can call [facility] or [facility] for an appointment. ° °[facility] °Address: [address], [place], [place] [address] °Phone: [phone] ° °[facility] °[address], [place],  °Phone: [phone] ° °

## 2022-04-16 ENCOUNTER — Other Ambulatory Visit: Payer: Self-pay | Admitting: Neurology

## 2022-06-01 ENCOUNTER — Other Ambulatory Visit: Payer: Self-pay | Admitting: Neurology

## 2022-06-01 DIAGNOSIS — G20 Parkinson's disease: Secondary | ICD-10-CM

## 2022-07-24 DIAGNOSIS — L603 Nail dystrophy: Secondary | ICD-10-CM | POA: Diagnosis not present

## 2022-08-01 ENCOUNTER — Other Ambulatory Visit: Payer: Self-pay | Admitting: Cardiovascular Disease

## 2022-08-14 DIAGNOSIS — K219 Gastro-esophageal reflux disease without esophagitis: Secondary | ICD-10-CM | POA: Diagnosis not present

## 2022-08-14 DIAGNOSIS — I252 Old myocardial infarction: Secondary | ICD-10-CM | POA: Diagnosis not present

## 2022-08-14 DIAGNOSIS — G252 Other specified forms of tremor: Secondary | ICD-10-CM | POA: Diagnosis not present

## 2022-08-14 DIAGNOSIS — E039 Hypothyroidism, unspecified: Secondary | ICD-10-CM | POA: Diagnosis not present

## 2022-08-14 DIAGNOSIS — G20A1 Parkinson's disease without dyskinesia, without mention of fluctuations: Secondary | ICD-10-CM | POA: Diagnosis not present

## 2022-08-14 DIAGNOSIS — I2510 Atherosclerotic heart disease of native coronary artery without angina pectoris: Secondary | ICD-10-CM | POA: Diagnosis not present

## 2022-08-14 DIAGNOSIS — I10 Essential (primary) hypertension: Secondary | ICD-10-CM | POA: Diagnosis not present

## 2022-08-14 DIAGNOSIS — E785 Hyperlipidemia, unspecified: Secondary | ICD-10-CM | POA: Diagnosis not present

## 2022-08-14 DIAGNOSIS — M1990 Unspecified osteoarthritis, unspecified site: Secondary | ICD-10-CM | POA: Diagnosis not present

## 2022-08-14 DIAGNOSIS — F419 Anxiety disorder, unspecified: Secondary | ICD-10-CM | POA: Diagnosis not present

## 2022-08-14 DIAGNOSIS — M8580 Other specified disorders of bone density and structure, unspecified site: Secondary | ICD-10-CM | POA: Diagnosis not present

## 2022-08-21 DIAGNOSIS — M25551 Pain in right hip: Secondary | ICD-10-CM | POA: Diagnosis not present

## 2022-09-12 DIAGNOSIS — G20A1 Parkinson's disease without dyskinesia, without mention of fluctuations: Secondary | ICD-10-CM | POA: Diagnosis not present

## 2022-09-12 DIAGNOSIS — M8580 Other specified disorders of bone density and structure, unspecified site: Secondary | ICD-10-CM | POA: Diagnosis not present

## 2022-09-12 DIAGNOSIS — G4733 Obstructive sleep apnea (adult) (pediatric): Secondary | ICD-10-CM | POA: Diagnosis not present

## 2022-09-12 DIAGNOSIS — N183 Chronic kidney disease, stage 3 unspecified: Secondary | ICD-10-CM | POA: Diagnosis not present

## 2022-09-12 DIAGNOSIS — K219 Gastro-esophageal reflux disease without esophagitis: Secondary | ICD-10-CM | POA: Diagnosis not present

## 2022-09-12 DIAGNOSIS — E785 Hyperlipidemia, unspecified: Secondary | ICD-10-CM | POA: Diagnosis not present

## 2022-09-12 DIAGNOSIS — I10 Essential (primary) hypertension: Secondary | ICD-10-CM | POA: Diagnosis not present

## 2022-09-12 DIAGNOSIS — E039 Hypothyroidism, unspecified: Secondary | ICD-10-CM | POA: Diagnosis not present

## 2022-09-12 DIAGNOSIS — F324 Major depressive disorder, single episode, in partial remission: Secondary | ICD-10-CM | POA: Diagnosis not present

## 2022-09-12 DIAGNOSIS — Z0000 Encounter for general adult medical examination without abnormal findings: Secondary | ICD-10-CM | POA: Diagnosis not present

## 2022-09-12 DIAGNOSIS — E669 Obesity, unspecified: Secondary | ICD-10-CM | POA: Diagnosis not present

## 2022-09-12 DIAGNOSIS — I2510 Atherosclerotic heart disease of native coronary artery without angina pectoris: Secondary | ICD-10-CM | POA: Diagnosis not present

## 2022-09-13 ENCOUNTER — Other Ambulatory Visit: Payer: Self-pay | Admitting: Cardiovascular Disease

## 2022-09-19 ENCOUNTER — Other Ambulatory Visit: Payer: Self-pay | Admitting: Cardiovascular Disease

## 2022-09-24 NOTE — Progress Notes (Unsigned)
Assessment/Plan:   1.  Parkinsons Disease  -Continue carbidopa/levodopa 25/100 CR, 2 at 7am, 2 at 11 am, 1 at 4pm  -Continue pramipexole 0.5 mg 3 times per day.  -We discussed that it used to be thought that levodopa would increase risk of melanoma but now it is believed that Parkinsons itself likely increases risk of melanoma. she is to get regular skin checks.   2.  PD dyskinesia  -Better with CR version of levodopa.  Haven't seen dyskinesia since we changed  3.  Hypertension  -On multiple antihypertensives  -Follows with cardiology  -will need to watch this closely with course of time due to risk of Neurogenic Orthostatic Hypotension with Parkinsons Disease   4.  Right foot dystonia  -Was referred to Dr. Letta Pate for possible Botox.  He recommended she try physical therapy first, but she did not follow back up with them.  Encouraged her to do so if dystonia sx's increase and she agreed, but seems to be stable for now.   Subjective:   Julie Carey was seen today in follow up for Parkinsons disease.  My previous records were reviewed prior to todays visit as well as outside records available to me.  She is doing well on levodopa and pramipexole.  No compulsive behaviors.  No sleep attacks.  No falls.  Current prescribed movement disorder medications: Carbidopa/levodopa 25/100 CR, 2/2/1 Pramipexole 0.5 mg 3 times per day.   PREVIOUS MEDICATIONS: carbidopa/levodopa 25/100 IR (changed because of dyskinesia)  ALLERGIES:  No Known Allergies  CURRENT MEDICATIONS:  Outpatient Encounter Medications as of 09/25/2022  Medication Sig   aspirin 81 MG tablet Take 81 mg by mouth daily.    calcium carbonate (OSCAL) 1500 (600 Ca) MG TABS tablet Take 1 tablet by mouth daily with breakfast.   Carbidopa-Levodopa ER (SINEMET CR) 25-100 MG tablet controlled release TAKE 2 TABLETS BY MOUTH AT 7AM, 2 TABLETS AT 11AM, AND 1 TABLET AT 4PM   citalopram (CELEXA) 10 MG tablet Take 10 mg by mouth  at bedtime.   clopidogrel (PLAVIX) 75 MG tablet TAKE 1 TABLET(75 MG) BY MOUTH DAILY   levothyroxine (SYNTHROID, LEVOTHROID) 75 MCG tablet TAKE 1 TABLET BY MOUTH DAILY BEFORE BREAKFAST   lisinopril (PRINIVIL,ZESTRIL) 10 MG tablet Take 1 tablet (10 mg total) by mouth daily.   LORazepam (ATIVAN) 0.5 MG tablet Take 1 tablet by mouth daily as needed.   metoprolol tartrate (LOPRESSOR) 25 MG tablet TAKE 1 TABLET(25 MG) BY MOUTH TWICE DAILY   Multiple Vitamin (MULTIVITAMIN) tablet Take 1 tablet by mouth daily.   pramipexole (MIRAPEX) 0.5 MG tablet TAKE 1 TABLET(0.5 MG) BY MOUTH THREE TIMES DAILY   simvastatin (ZOCOR) 40 MG tablet NEED OV.   Zinc 100 MG TABS Take by mouth.   No facility-administered encounter medications on file as of 09/25/2022.    Objective:   PHYSICAL EXAMINATION:    VITALS:   There were no vitals filed for this visit.     GEN:  The patient appears stated age and is in NAD. HEENT:  Normocephalic, atraumatic.  The mucous membranes are moist. The superficial temporal arteries are without ropiness or tenderness. CV:  RRR Lungs:  CTAB Neck/HEME:  There are no carotid bruits bilaterally.  Neurological examination:  Orientation: The patient is alert and oriented x3. Cranial nerves: There is good facial symmetry with min facial hypomimia. The speech is fluent and clear. Soft palate rises symmetrically and there is no tongue deviation. Hearing is intact to conversational tone.  Sensation: Sensation is intact to light touch throughout Motor: Strength is at least antigravity x4.  Movement examination: Tone: There is normal tone in the UE/LE Abnormal movements: there is RUE rest tremor  Coordination:  There is no decremation, with any form of RAMS, including alternating supination and pronation of the forearm, hand opening and closing, finger taps, heel taps and toe taps Gait and Station: The patient has no difficulty arising out of a deep-seated chair without the use of the  hands. The patient's stride length is just slightly short stepped.  I have reviewed and interpreted the following labs independently    Chemistry      Component Value Date/Time   NA 140 10/25/2010 0515   K 4.1 10/25/2010 0515   CL 112 10/25/2010 0515   CO2 24 10/25/2010 0515   BUN 10 10/25/2010 0515   CREATININE 0.86 10/25/2010 0515      Component Value Date/Time   CALCIUM 8.6 10/25/2010 0515   ALKPHOS 83 07/17/2018 1020   AST 22 07/17/2018 1020   ALT 18 07/17/2018 1020   BILITOT 0.4 07/17/2018 1020       Lab Results  Component Value Date   WBC 9.0 10/25/2010   HGB 11.5 (L) 10/25/2010   HCT 35.0 (L) 10/25/2010   MCV 87.1 10/25/2010   PLT 287 10/25/2010    Lab Results  Component Value Date   TSH 2.765 10/23/2010   Total time spent on today's visit was *** minutes, including both face-to-face time and nonface-to-face time.  Time included that spent on review of records (prior notes available to me/labs/imaging if pertinent), discussing treatment and goals, answering patient's questions and coordinating care.    Cc:  Carol Ada, MD

## 2022-09-25 ENCOUNTER — Encounter: Payer: Self-pay | Admitting: Neurology

## 2022-09-25 ENCOUNTER — Ambulatory Visit: Payer: PPO | Admitting: Neurology

## 2022-09-25 VITALS — BP 124/82 | HR 81 | Ht 62.5 in | Wt 210.4 lb

## 2022-09-25 DIAGNOSIS — D225 Melanocytic nevi of trunk: Secondary | ICD-10-CM | POA: Diagnosis not present

## 2022-09-25 DIAGNOSIS — G20A1 Parkinson's disease without dyskinesia, without mention of fluctuations: Secondary | ICD-10-CM | POA: Diagnosis not present

## 2022-09-25 DIAGNOSIS — D1801 Hemangioma of skin and subcutaneous tissue: Secondary | ICD-10-CM | POA: Diagnosis not present

## 2022-09-25 DIAGNOSIS — L308 Other specified dermatitis: Secondary | ICD-10-CM | POA: Diagnosis not present

## 2022-09-25 DIAGNOSIS — L812 Freckles: Secondary | ICD-10-CM | POA: Diagnosis not present

## 2022-09-25 DIAGNOSIS — L603 Nail dystrophy: Secondary | ICD-10-CM | POA: Diagnosis not present

## 2022-09-25 DIAGNOSIS — L821 Other seborrheic keratosis: Secondary | ICD-10-CM | POA: Diagnosis not present

## 2022-09-25 NOTE — Patient Instructions (Signed)
Local and Online Resources for Power over Parkinson's Group  November 2023    LOCAL [place] PARKINSON'S GROUPS   Power over Parkinson's Group:    Power Over Parkinson's Patient Education Group will be Wednesday, November 8th-*Hybrid meting*- in person at [facility] location and via [facility], 2:00-3:00 pm.   Starting in November, Power over [employer] and Care Partner Groups will meet together, with plans for separate break out session for caregivers (*this will be evolving over the next few months) Upcoming Power over Parkinson's Meetings/Care Partner Support:  2nd Wednesdays of the month at 2 pm:   November 8th, December 13th  [place] at [email or web address] if interested in participating in this group    [place] and Fall Prevention Workshop.  Thursday, November 9th 1-2pm, [employer] [place], [employer].  Register with Vonna Kotyk at [email or web address] or [phone] New PWR! Moves [employer] Instructor-Led Classes offering at [employer]!  TUESDAYS and Wednesdays 1-2 pm.   Contact Vonna Kotyk at  [email or web address]  or [phone] (Tuesday classes are modified for chair and standing only) Dance for Parkinson 's classes will be on Tuesdays 9:30am-10:30am starting October 3-December 12 with a break the week of November 21st. Located in the [employer] , in the first floor of the [employer] ([place].) To register:  [email or web address] or [phone]  Drumming for Parkinson's will be held on 2nd and 4th Mondays at 11:00 am.   Located at the [place] ([place]  [place] at [email or web address] or [phone]  Through support from the [place] for Parkinson's classes are free for both patients and caregivers.    [employer] Parkinson's Tai Chi  Class, Mondays at 11 am.  Call [phone] for details Parkinson's Holiday Party.  Wednesday, December 6th, 4:00-5:00 pm.  [facility] and Fitness.  RSVP to Garnetta Buddy at [phone] or [email or web address]   [place]:  [email or web address]  PD Health at Home continues:  Mindfulness Mondays, Wellness Wednesdays, Fitness Fridays   Upcoming Education:   Why Should you Participate in Parkinson's Research?  Wednesday, Nov. 29th,  1-2 pm  Expert Briefing:    Hallucinations and Delusions in Parkinson's.  Wednesday, Nov. 8th, 1-2 pm  Register for expert briefings (webinars) at [email or web address]  Please check out their website to sign up for emails and see their full online offerings      [place]:  [email or web address]   Third Thursday Webinars:  On the third Thursday of every month at 12 p.m. ET, join our free live webinars to learn about various aspects of living with Parkinson's disease and our work to speed medical breakthroughs.  Upcoming Webinar:  A Year Like No Other in Parkinson's Research:  2023 in Review.  Thursday, November 16th 12 noon. Check out additional information on their website to see their full online offerings    [facility]: Lambert Rhonda W:  [email or web address]  Upcoming Webinar:   Stay tuned  Webinar Series:  Living with Parkinson's Meetup.   Third Thursdays each month, 3 pm  Care Partner Monthly Meetup.  With Robin Searing Phinney.  First Tuesday of each month, 2 pm  Check out additional information to Live Well Today on their website    Parkinson and Movement Disorders (PMD) Alliance:  www.[email or web address]  [employer]:  Online Education Events  Sign up for emails, which are sent weekly to give  you updates on programming and online offerings    Parkinson's Association of the [place]:  www.[email or web address]   Information on online support groups, education events, and online exercises including Yoga, Parkinson's exercises and more-LOTS of information on links to PD resources and online events  Virtual Support Group through Parkinson's Association of the [place]; next one is scheduled for Wednesday, November 1st  at 2 pm.  (These are typically scheduled for the 1st Wednesday of the month at 2 pm).  Visit website for details.   MOVEMENT AND EXERCISE OPPORTUNITIES  PWR! Moves Classes at [place].  Wednesdays 10 and 11 am.   Contact Amy Marriott, PT [email or web address] if interested.  NEW PWR! Moves Class offerings at [employer].  *TUESDAYS* and Wednesdays 1-2 pm.  Contact Vonna Kotyk at  [email or web address]    Parkinson's Wellness Recovery (PWR! Moves)  [email or web address]  Info on the PWR! Virtual Experience:  You will have access to our expertise?through self-assessment, guided plans that start with the PD-specific fundamentals, educational content, tips, Q&A with an expert, and a growing Art therapist of PD-specific pre-recorded and live exercise classes of varying types and intensity - both physical and cognitive! If that is not enough, we offer 1:1 wellness consultations (in-person or virtual) to personalize your PWR! Research scientist (medical).   [place] Fridays:   As part of the PD Health @ Home program, this free video series focuses each week on one aspect of fitness designed to support people living with Parkinson's.? These weekly videos highlight the [place] fitness guidelines for people with Parkinson's disease.  [email or web address]   Dance for PD website is offering free, live-stream classes throughout the week, as well as links to [employer] of classes:  [email or web address]  Virtual dance and Pilates for Parkinson's classes: Click on the Community Tab> Parkinson's  Movement Initiative Tab.  To register for classes and for more information, visit [email or web address] and click the "community" tab.   [employer] Parkinson's Cycling Classes   [employer]:  Thursdays @ Noon-Live classes at [employer] ([employer] at [email or web address]?or [phone])  [facility]: Virtual Classes Mondays and Thursdays Jeanette Caprice classes Tuesday, Wednesday and Thursday (contact [place] at [email or web address] ?or [phone])  [place]  Varied levels of classes are offered Tuesdays and Thursdays at [employer].   Stretching with Verdis Frederickson weekly class is also offered for people with Parkinson's  To observe a class or for more information, call [phone] or email Hezzie Bump at [email or web address]   ADDITIONAL SUPPORT AND RESOURCES  [employer]:Online Caregiver Education Opportunities:  [email or web address]/caregiver-education/[email or web address]-support-group.  You may also contact Vickki Muff at [email or web address] or [phone].     Well-Spring Navigator:  Just1Navigator program, a?free service to help individuals and families through the journey of determining care for older adults.  The "Navigator" is a Education officer, museum, Arnell Asal, who will speak with a prospective client and/or loved ones to provide an assessment of the situation and a set of recommendations for a personalized care plan -- all free of charge, and whether?[employer] offers the needed service or not. If the need is not a service we provide, we are well-connected with reputable programs in town that we can refer you to.  www.well-[email or web address] or to speak with the Navigator, call [phone].

## 2022-10-17 ENCOUNTER — Encounter: Payer: Self-pay | Admitting: Neurology

## 2022-10-31 ENCOUNTER — Other Ambulatory Visit: Payer: Self-pay | Admitting: Cardiovascular Disease

## 2022-11-01 ENCOUNTER — Other Ambulatory Visit: Payer: Self-pay | Admitting: Cardiovascular Disease

## 2022-11-02 ENCOUNTER — Other Ambulatory Visit: Payer: Self-pay | Admitting: Cardiovascular Disease

## 2022-11-11 ENCOUNTER — Other Ambulatory Visit: Payer: Self-pay | Admitting: Neurology

## 2022-11-12 ENCOUNTER — Emergency Department (HOSPITAL_COMMUNITY): Payer: PPO

## 2022-11-12 ENCOUNTER — Inpatient Hospital Stay (HOSPITAL_COMMUNITY)
Admission: EM | Admit: 2022-11-12 | Discharge: 2022-11-14 | DRG: 321 | Disposition: A | Discharge status: Home / Self Care | Payer: PPO | Attending: Cardiovascular Disease | Admitting: Cardiovascular Disease

## 2022-11-12 DIAGNOSIS — E785 Hyperlipidemia, unspecified: Secondary | ICD-10-CM | POA: Diagnosis present

## 2022-11-12 DIAGNOSIS — Z8249 Family history of ischemic heart disease and other diseases of the circulatory system: Secondary | ICD-10-CM

## 2022-11-12 DIAGNOSIS — I110 Hypertensive heart disease with heart failure: Secondary | ICD-10-CM | POA: Diagnosis present

## 2022-11-12 DIAGNOSIS — Z7902 Long term (current) use of antithrombotics/antiplatelets: Secondary | ICD-10-CM | POA: Diagnosis not present

## 2022-11-12 DIAGNOSIS — Z79899 Other long term (current) drug therapy: Secondary | ICD-10-CM

## 2022-11-12 DIAGNOSIS — R079 Chest pain, unspecified: Secondary | ICD-10-CM | POA: Diagnosis not present

## 2022-11-12 DIAGNOSIS — Z79890 Hormone replacement therapy (postmenopausal): Secondary | ICD-10-CM | POA: Diagnosis not present

## 2022-11-12 DIAGNOSIS — R110 Nausea: Secondary | ICD-10-CM | POA: Diagnosis not present

## 2022-11-12 DIAGNOSIS — I5031 Acute diastolic (congestive) heart failure: Secondary | ICD-10-CM | POA: Insufficient documentation

## 2022-11-12 DIAGNOSIS — R457 State of emotional shock and stress, unspecified: Secondary | ICD-10-CM | POA: Diagnosis not present

## 2022-11-12 DIAGNOSIS — Z823 Family history of stroke: Secondary | ICD-10-CM

## 2022-11-12 DIAGNOSIS — I214 Non-ST elevation (NSTEMI) myocardial infarction: Secondary | ICD-10-CM | POA: Diagnosis present

## 2022-11-12 DIAGNOSIS — Z955 Presence of coronary angioplasty implant and graft: Secondary | ICD-10-CM

## 2022-11-12 DIAGNOSIS — I252 Old myocardial infarction: Secondary | ICD-10-CM

## 2022-11-12 DIAGNOSIS — Z7982 Long term (current) use of aspirin: Secondary | ICD-10-CM | POA: Diagnosis not present

## 2022-11-12 DIAGNOSIS — R000 Tachycardia, unspecified: Secondary | ICD-10-CM | POA: Diagnosis not present

## 2022-11-12 DIAGNOSIS — I2510 Atherosclerotic heart disease of native coronary artery without angina pectoris: Secondary | ICD-10-CM | POA: Diagnosis present

## 2022-11-12 DIAGNOSIS — R9431 Abnormal electrocardiogram [ECG] [EKG]: Secondary | ICD-10-CM | POA: Diagnosis not present

## 2022-11-12 DIAGNOSIS — I10 Essential (primary) hypertension: Secondary | ICD-10-CM | POA: Diagnosis present

## 2022-11-12 DIAGNOSIS — G20A1 Parkinson's disease without dyskinesia, without mention of fluctuations: Secondary | ICD-10-CM | POA: Diagnosis present

## 2022-11-12 DIAGNOSIS — E039 Hypothyroidism, unspecified: Secondary | ICD-10-CM | POA: Insufficient documentation

## 2022-11-12 DIAGNOSIS — R0789 Other chest pain: Secondary | ICD-10-CM | POA: Diagnosis not present

## 2022-11-12 HISTORY — DX: Hypothyroidism, unspecified: E03.9

## 2022-11-12 HISTORY — DX: Parkinson's disease without dyskinesia, without mention of fluctuations: G20.A1

## 2022-11-12 LAB — PROTIME-INR
INR: 1 (ref 0.8–1.2)
Prothrombin Time: 13.4 seconds (ref 11.4–15.2)

## 2022-11-12 LAB — CBC WITH DIFFERENTIAL/PLATELET
Abs Immature Granulocytes: 0.05 10*3/uL (ref 0.00–0.07)
Basophils Absolute: 0.1 10*3/uL (ref 0.0–0.1)
Basophils Relative: 0 %
Eosinophils Absolute: 0 10*3/uL (ref 0.0–0.5)
Eosinophils Relative: 0 %
HCT: 37.4 % (ref 36.0–46.0)
Hemoglobin: 12.9 g/dL (ref 12.0–15.0)
Immature Granulocytes: 0 %
Lymphocytes Relative: 9 %
Lymphs Abs: 1 10*3/uL (ref 0.7–4.0)
MCH: 30.6 pg (ref 26.0–34.0)
MCHC: 34.5 g/dL (ref 30.0–36.0)
MCV: 88.6 fL (ref 80.0–100.0)
Monocytes Absolute: 0.4 10*3/uL (ref 0.1–1.0)
Monocytes Relative: 3 %
Neutro Abs: 9.8 10*3/uL — ABNORMAL HIGH (ref 1.7–7.7)
Neutrophils Relative %: 88 %
Platelets: 321 10*3/uL (ref 150–400)
RBC: 4.22 MIL/uL (ref 3.87–5.11)
RDW: 12.8 % (ref 11.5–15.5)
WBC: 11.3 10*3/uL — ABNORMAL HIGH (ref 4.0–10.5)
nRBC: 0 % (ref 0.0–0.2)

## 2022-11-12 LAB — COMPREHENSIVE METABOLIC PANEL
ALT: 7 U/L (ref 0–44)
AST: 24 U/L (ref 15–41)
Albumin: 4 g/dL (ref 3.5–5.0)
Alkaline Phosphatase: 64 U/L (ref 38–126)
Anion gap: 9 (ref 5–15)
BUN: 21 mg/dL (ref 8–23)
CO2: 23 mmol/L (ref 22–32)
Calcium: 9 mg/dL (ref 8.9–10.3)
Chloride: 100 mmol/L (ref 98–111)
Creatinine, Ser: 0.79 mg/dL (ref 0.44–1.00)
GFR, Estimated: >60 mL/min (ref >60)
Glucose, Bld: 130 mg/dL — ABNORMAL HIGH (ref 70–99)
Potassium: 4 mmol/L (ref 3.5–5.1)
Sodium: 132 mmol/L — ABNORMAL LOW (ref 135–145)
Total Bilirubin: 0.5 mg/dL (ref 0.3–1.2)
Total Protein: 6.6 g/dL (ref 6.5–8.1)

## 2022-11-12 LAB — TROPONIN I (HIGH SENSITIVITY): Troponin I (High Sensitivity): 23 ng/L — ABNORMAL HIGH (ref <18)

## 2022-11-12 NOTE — ED Provider Notes (Signed)
[facility] Provider Note   CSN: [id number] Arrival date & time: 11/12/22  2146     History  Chief Complaint  Patient presents with   Chest Pain    Julie Carey is a 71 y.o. female with hx of MI/ CAD, s/p stent placement on ASA + plavix who presents with concern for intermittent CP since 3 pm today.  Patient states that her symptoms primarily started as a "anxious feeling", similar to when she has an anxiety attack.  Started as a pressure and fullness sensation in her chest with associated nausea.  Did not improve with 2 doses of lorazepam, was relieved with 1 nitroglycerin around 6 PM, however pain continued to recur prompting EMS call.  She was administered 324 of aspirin additionally with response to EMS but no additional nitro.  Patient denies any alleviating or aggravating factors for her symptoms.  States that this does not feel similar to her MI in the past.  Currently chest pain-free.  No associated shortness of breath.  Nausea resolved at this time.  I personally read this patient's medical records.  She is history of hypertension in addition to her CAD, hyperlipidemia, and Parkinson's disease on carbidopa levodopa, hypothyroidism.  On Lopressor for hypertension, did have her dose this evening.  Echocardiogram in 2015 with LVEF 50-55%, posterior hypokinesis.   HPI     Home Medications Prior to Admission medications   Medication Sig Start Date End Date Taking? Authorizing Provider  aspirin 81 MG tablet Take 81 mg by mouth daily.    Yes [provider]  B Complex-C (B-COMPLEX WITH VITAMIN C) tablet Take 1 tablet by mouth daily.   Yes [provider]  calcium carbonate (OSCAL) 1500 (600 Ca) MG TABS tablet Take 1 tablet by mouth daily with breakfast.   Yes [provider]  Carbidopa-Levodopa ER (SINEMET CR) 25-100 MG tablet controlled release TAKE 2 TABLETS BY MOUTH AT 7AM, 2 TABLETS AT 11AM AND TAKE 1  TABLET AT 4 PM Patient taking differently: Take 1-2 tablets by mouth See admin instructions. TAKE 2 TABLETS BY MOUTH AT 7AM, 2 TABLETS AT 11AM AND TAKE 1 TABLET AT 4 PM 11/12/22  Yes Tat, Eustace Quail, DO  citalopram (CELEXA) 10 MG tablet Take 10 mg by mouth at bedtime.   Yes [provider]  clopidogrel (PLAVIX) 75 MG tablet TAKE 1 TABLET(75 MG) BY MOUTH DAILY Patient taking differently: Take 75 mg by mouth daily. 03/05/22  Yes Lorretta Harp, MD  levothyroxine (SYNTHROID, LEVOTHROID) 75 MCG tablet TAKE 1 TABLET BY MOUTH DAILY BEFORE BREAKFAST 08/08/17  Yes Lorretta Harp, MD  lisinopril (PRINIVIL,ZESTRIL) 10 MG tablet Take 1 tablet (10 mg total) by mouth daily. 07/26/16  Yes Lorretta Harp, MD  LORazepam (ATIVAN) 0.5 MG tablet Take 0.5 mg by mouth daily as needed for anxiety. 06/28/20  Yes [provider]  metoprolol tartrate (LOPRESSOR) 25 MG tablet TAKE 1 TABLET(25 MG) BY MOUTH TWICE DAILY Patient taking differently: Take 25 mg by mouth 2 (two) times daily. 09/17/22  Yes Lorretta Harp, MD  Multiple Vitamin (MULTIVITAMIN) tablet Take 1 tablet by mouth daily.   Yes [provider]  pramipexole (MIRAPEX) 0.5 MG tablet TAKE 1 TABLET(0.5 MG) BY MOUTH THREE TIMES DAILY Patient taking differently: Take 0.5 mg by mouth 3 (three) times daily. 06/01/22  Yes Tat, Eustace Quail, DO  simvastatin (ZOCOR) 40 MG tablet TAKE 1 TABLET(40 MG) BY MOUTH DAILY Patient taking differently:  Take 40 mg by mouth daily. 11/02/22  Yes Lorretta Harp, MD  Zinc 100 MG TABS Take 100 mg by mouth daily.   Yes [provider]      Allergies    Patient has no known allergies.    Review of Systems   Review of Systems  Constitutional: Negative.   HENT: Negative.    Respiratory:  Positive for chest tightness. Negative for shortness of breath.   Cardiovascular:  Positive for chest pain. Negative for palpitations and leg swelling.  Gastrointestinal: Negative.   Neurological:  Positive  for tremors.       Tremors at baseline with parkinsons.  Psychiatric/Behavioral:  The patient is nervous/anxious.     Physical Exam Updated Vital Signs BP (!) 106/59   Pulse 68   Temp 98.1 F (36.7 C) (Oral)   Resp 18   Ht 5' 2.5" (1.588 m)   Wt 95.3 kg   SpO2 96%   BMI 37.80 kg/m  Physical Exam Vitals and nursing note reviewed.  Constitutional:      Appearance: She is obese. She is not ill-appearing or toxic-appearing.  HENT:     Head: Normocephalic and atraumatic.     Mouth/Throat:     Mouth: Mucous membranes are moist.     Pharynx: No oropharyngeal exudate or posterior oropharyngeal erythema.  Eyes:     General:        Right eye: No discharge.        Left eye: No discharge.     Conjunctiva/sclera: Conjunctivae normal.  Cardiovascular:     Rate and Rhythm: Normal rate and regular rhythm.     Pulses: Normal pulses.     Heart sounds: Normal heart sounds. No murmur heard. Pulmonary:     Effort: Pulmonary effort is normal. No respiratory distress.     Breath sounds: Normal breath sounds. No wheezing or rales.  Chest:     Chest wall: No mass, tenderness or edema.  Abdominal:     General: Bowel sounds are normal. There is no distension.     Palpations: Abdomen is soft.     Tenderness: There is no abdominal tenderness.  Musculoskeletal:        General: No deformity.     Cervical back: Neck supple.     Right lower leg: No tenderness. No edema.     Left lower leg: No tenderness. No edema.  Skin:    General: Skin is warm and dry.     Capillary Refill: Capillary refill takes less than 2 seconds.  Neurological:     General: No focal deficit present.     Mental Status: She is alert and oriented to person, place, and time. Mental status is at baseline.  Psychiatric:        Mood and Affect: Mood normal.     ED Results / Procedures / Treatments   Labs (all labs ordered are listed, but only abnormal results are displayed) Labs Reviewed  CBC WITH DIFFERENTIAL/PLATELET  - Abnormal; Notable for the following components:      Result Value   WBC 11.3 (*)    Neutro Abs 9.8 (*)    All other components within normal limits  COMPREHENSIVE METABOLIC PANEL - Abnormal; Notable for the following components:   Sodium 132 (*)    Glucose, Bld 130 (*)    All other components within normal limits  TROPONIN I (HIGH SENSITIVITY) - Abnormal; Notable for the following components:   Troponin I (High Sensitivity) 23 (*)  All other components within normal limits  TROPONIN I (HIGH SENSITIVITY) - Abnormal; Notable for the following components:   Troponin I (High Sensitivity) 110 (*)    All other components within normal limits  PROTIME-INR  HEPARIN LEVEL (UNFRACTIONATED)    EKG None  Radiology DG Chest Portable 1 View  Result Date: 11/12/2022 CLINICAL DATA:  Chest pain EXAM: PORTABLE CHEST 1 VIEW COMPARISON:  10/22/2010 FINDINGS: The heart size and mediastinal contours are within normal limits. Both lungs are clear. The visualized skeletal structures are unremarkable. IMPRESSION: No active disease. Electronically Signed   By: Fidela Salisbury M.D.   On: 11/12/2022 22:55    Procedures .Critical Care  Performed by: Emeline Darling, PA-C Authorized by: Emeline Darling, PA-C   Critical care provider statement:    Critical care time (minutes):  30   Critical care was time spent personally by me on the following activities:  Development of treatment plan with patient or surrogate, discussions with consultants, evaluation of patient's response to treatment, examination of patient, ordering and review of laboratory studies, ordering and review of radiographic studies, ordering and performing treatments and interventions, pulse oximetry, re-evaluation of patient's condition and review of old charts     Medications Ordered in ED Medications  heparin ADULT infusion 100 units/mL (25000 units/260m) (1,000 Units/hr Intravenous New Bag/Given 11/13/22 0217)  heparin  bolus via infusion 4,000 Units (4,000 Units Intravenous Bolus from Bag 11/13/22 0219)    ED Course/ Medical Decision Making/ A&P Clinical Course as of 11/13/22 0327  Tue Nov 13, 2022  0147 Consult to Dr. KHumphrey Rolls cardiologist to agreeable to admitting this patient to his service.  Appreciate collaboration in the care of this patient. [RS]    Clinical Course User Index [RS] Linkin Vizzini, RGypsy Balsam PA-C  }          HEART Score: 8                Medical Decision Making 72year old female with history of coronary artery disease presents with intermittent chest pain this evening relieved with nitro at home.  Hypertensive on intake with diastolic BP greater than 90, improved at this time.  Cardiopulmonary exams unremarkable, abdominal exam is benign.  Patient with no lower extremity edema.  Well-appearing at this time.  Reassuring vital signs.  Differential diagnosis for this patient symptoms include but is not limited to ACS, PE, pleural effusion, dysrhythmia, pneumonia, skill skeletal injury, GERD/esophagitis.  Amount and/or Complexity of Data Reviewed Labs: ordered.    Details: CBC with mild leukocytosis 11,000, no anemia.  CMP with mild hyponatremia of 132, INR is normal.  Troponin elevated to 23, delta troponin significant elevated to 110.  Radiology: ordered.  Risk Prescription drug management. Decision regarding hospitalization.   Patient remains chest pain-free, however given above laboratory studies concern for NSTEMI at this time.  Will proceed with heparinization.  Consult cardiology as above, patient admitted to their service.  Remains hemodynamically stable this time.  GBaker Janusand her family  voiced understanding of her medical evaluation and treatment plan. Each of their questions answered to their expressed satisfaction.   This chart was dictated using voice recognition software, Dragon. Despite the best efforts of this provider to proofread and correct errors, errors may still  occur which can change documentation meaning.   Final Clinical Impression(s) / ED Diagnoses Final diagnoses:  NSTEMI (non-ST elevated myocardial infarction) ([place]    Rx / DC Orders ED Discharge Orders     None  Emeline Darling, PA-C 11/13/22 0327    Drenda Freeze, MD 11/15/22 [phone]

## 2022-11-12 NOTE — ED Triage Notes (Signed)
Pt bib [facility] from home c/o CP that started around 1500 today. Hx MI w stents, took ASA and nitro prior to EMS arrival with relief. Currently reporting 0/10 pain. EKG unremarkable  BP 114/78 HR 70 SPO2 98% RA  CBG 169

## 2022-11-13 ENCOUNTER — Inpatient Hospital Stay (HOSPITAL_COMMUNITY)
Admission: EM | Disposition: A | Discharge status: Home / Self Care | Payer: Self-pay | Source: Home / Self Care | Attending: Internal Medicine

## 2022-11-13 ENCOUNTER — Other Ambulatory Visit (HOSPITAL_COMMUNITY): Payer: PPO

## 2022-11-13 ENCOUNTER — Encounter (HOSPITAL_COMMUNITY): Payer: Self-pay | Admitting: Cardiology

## 2022-11-13 ENCOUNTER — Other Ambulatory Visit: Payer: Self-pay

## 2022-11-13 DIAGNOSIS — Z955 Presence of coronary angioplasty implant and graft: Secondary | ICD-10-CM | POA: Diagnosis not present

## 2022-11-13 DIAGNOSIS — Z8249 Family history of ischemic heart disease and other diseases of the circulatory system: Secondary | ICD-10-CM | POA: Diagnosis not present

## 2022-11-13 DIAGNOSIS — I5031 Acute diastolic (congestive) heart failure: Secondary | ICD-10-CM | POA: Diagnosis present

## 2022-11-13 DIAGNOSIS — R9431 Abnormal electrocardiogram [ECG] [EKG]: Secondary | ICD-10-CM | POA: Diagnosis not present

## 2022-11-13 DIAGNOSIS — E039 Hypothyroidism, unspecified: Secondary | ICD-10-CM | POA: Diagnosis present

## 2022-11-13 DIAGNOSIS — Z823 Family history of stroke: Secondary | ICD-10-CM | POA: Diagnosis not present

## 2022-11-13 DIAGNOSIS — I214 Non-ST elevation (NSTEMI) myocardial infarction: Secondary | ICD-10-CM | POA: Diagnosis present

## 2022-11-13 DIAGNOSIS — E785 Hyperlipidemia, unspecified: Secondary | ICD-10-CM | POA: Diagnosis present

## 2022-11-13 DIAGNOSIS — Z79899 Other long term (current) drug therapy: Secondary | ICD-10-CM | POA: Diagnosis not present

## 2022-11-13 DIAGNOSIS — Z79890 Hormone replacement therapy (postmenopausal): Secondary | ICD-10-CM | POA: Diagnosis not present

## 2022-11-13 DIAGNOSIS — Z7982 Long term (current) use of aspirin: Secondary | ICD-10-CM | POA: Diagnosis not present

## 2022-11-13 DIAGNOSIS — I2510 Atherosclerotic heart disease of native coronary artery without angina pectoris: Secondary | ICD-10-CM | POA: Diagnosis present

## 2022-11-13 DIAGNOSIS — Z7902 Long term (current) use of antithrombotics/antiplatelets: Secondary | ICD-10-CM | POA: Diagnosis not present

## 2022-11-13 DIAGNOSIS — G20A1 Parkinson's disease without dyskinesia, without mention of fluctuations: Secondary | ICD-10-CM | POA: Diagnosis present

## 2022-11-13 DIAGNOSIS — I252 Old myocardial infarction: Secondary | ICD-10-CM | POA: Diagnosis not present

## 2022-11-13 DIAGNOSIS — I110 Hypertensive heart disease with heart failure: Secondary | ICD-10-CM | POA: Diagnosis present

## 2022-11-13 HISTORY — PX: LEFT HEART CATH AND CORONARY ANGIOGRAPHY: CATH118249

## 2022-11-13 HISTORY — PX: CORONARY STENT INTERVENTION: CATH118234

## 2022-11-13 LAB — TROPONIN I (HIGH SENSITIVITY)
Troponin I (High Sensitivity): 110 ng/L (ref <18)
Troponin I (High Sensitivity): 150 ng/L (ref <18)
Troponin I (High Sensitivity): 1011 ng/L (ref <18)

## 2022-11-13 LAB — POCT ACTIVATED CLOTTING TIME: Activated Clotting Time: 439 seconds

## 2022-11-13 LAB — HEPARIN LEVEL (UNFRACTIONATED): Heparin Unfractionated: 0.97 IU/mL — ABNORMAL HIGH (ref 0.30–0.70)

## 2022-11-13 SURGERY — LEFT HEART CATH AND CORONARY ANGIOGRAPHY
Anesthesia: LOCAL

## 2022-11-13 MED ORDER — CALCIUM CARBONATE 1250 (500 CA) MG PO TABS
1.0000 | ORAL_TABLET | Freq: Every day | ORAL | Status: DC
  Administered 2022-11-13 – 2022-11-14 (×2): 1250 mg via ORAL
  Filled 2022-11-13 (×2): qty 1

## 2022-11-13 MED ORDER — HEPARIN SODIUM (PORCINE) 1000 UNIT/ML IJ SOLN
INTRAMUSCULAR | Status: DC | PRN
  Administered 2022-11-13: 5000 [IU] via INTRAVENOUS

## 2022-11-13 MED ORDER — ONDANSETRON HCL 4 MG/2ML IJ SOLN: 4.0000 mg | Freq: Four times a day (QID) | INTRAMUSCULAR | Status: DC | PRN

## 2022-11-13 MED ORDER — SODIUM CHLORIDE 0.9 % IV SOLN: 250.0000 mL | INTRAVENOUS | Status: DC | PRN

## 2022-11-13 MED ORDER — CARBIDOPA-LEVODOPA ER 25-100 MG PO TBCR
1.0000 | EXTENDED_RELEASE_TABLET | Freq: Every day | ORAL | Status: DC
  Administered 2022-11-13: 1 via ORAL
  Filled 2022-11-13 (×2): qty 1

## 2022-11-13 MED ORDER — DOPAMINE-DEXTROSE 3.2-5 MG/ML-% IV SOLN
INTRAVENOUS | Status: DC | PRN
  Administered 2022-11-13: 3 ug/kg/min via INTRAVENOUS

## 2022-11-13 MED ORDER — PRAMIPEXOLE DIHYDROCHLORIDE 0.25 MG PO TABS
0.5000 mg | ORAL_TABLET | Freq: Three times a day (TID) | ORAL | Status: DC
  Administered 2022-11-13 – 2022-11-14 (×3): 0.5 mg via ORAL
  Filled 2022-11-13 (×7): qty 2

## 2022-11-13 MED ORDER — LIDOCAINE HCL (PF) 1 % IJ SOLN
INTRAMUSCULAR | Status: DC | PRN
  Administered 2022-11-13: 2 mL via INTRADERMAL

## 2022-11-13 MED ORDER — MORPHINE SULFATE (PF) 4 MG/ML IV SOLN
4.0000 mg | Freq: Once | INTRAVENOUS | Status: AC
  Administered 2022-11-13: 4 mg via INTRAVENOUS
  Filled 2022-11-13: qty 1

## 2022-11-13 MED ORDER — HEPARIN SODIUM (PORCINE) 5000 UNIT/ML IJ SOLN
5000.0000 [IU] | Freq: Three times a day (TID) | INTRAMUSCULAR | Status: DC
  Administered 2022-11-13 – 2022-11-14 (×2): 5000 [IU] via SUBCUTANEOUS
  Filled 2022-11-13 (×2): qty 1

## 2022-11-13 MED ORDER — LIDOCAINE HCL (PF) 1 % IJ SOLN
INTRAMUSCULAR | Status: AC
  Filled 2022-11-13: qty 30

## 2022-11-13 MED ORDER — HEPARIN SODIUM (PORCINE) 1000 UNIT/ML IJ SOLN
INTRAMUSCULAR | Status: AC
  Filled 2022-11-13: qty 10

## 2022-11-13 MED ORDER — CARBIDOPA-LEVODOPA ER 25-100 MG PO TBCR
2.0000 | EXTENDED_RELEASE_TABLET | Freq: Two times a day (BID) | ORAL | Status: DC
  Administered 2022-11-13 – 2022-11-14 (×3): 2 via ORAL
  Filled 2022-11-13 (×4): qty 2

## 2022-11-13 MED ORDER — LEVOTHYROXINE SODIUM 75 MCG PO TABS
75.0000 ug | ORAL_TABLET | Freq: Every day | ORAL | Status: DC
  Administered 2022-11-13 – 2022-11-14 (×2): 75 ug via ORAL
  Filled 2022-11-13 (×2): qty 1

## 2022-11-13 MED ORDER — ASPIRIN 81 MG PO TBEC
81.0000 mg | DELAYED_RELEASE_TABLET | Freq: Every day | ORAL | Status: DC
  Administered 2022-11-13 – 2022-11-14 (×2): 81 mg via ORAL
  Filled 2022-11-13 (×2): qty 1

## 2022-11-13 MED ORDER — ZINC SULFATE 220 (50 ZN) MG PO CAPS
220.0000 mg | ORAL_CAPSULE | Freq: Every day | ORAL | Status: DC
  Administered 2022-11-14: 220 mg via ORAL
  Filled 2022-11-13: qty 1

## 2022-11-13 MED ORDER — CITALOPRAM HYDROBROMIDE 10 MG PO TABS
10.0000 mg | ORAL_TABLET | Freq: Every day | ORAL | Status: DC
  Administered 2022-11-13: 10 mg via ORAL
  Filled 2022-11-13: qty 1

## 2022-11-13 MED ORDER — SODIUM CHLORIDE 0.9 % WEIGHT BASED INFUSION: 3.0000 mL/kg/h | INTRAVENOUS | Status: DC

## 2022-11-13 MED ORDER — LORAZEPAM 0.5 MG PO TABS: 0.5000 mg | ORAL_TABLET | Freq: Every day | ORAL | Status: DC | PRN

## 2022-11-13 MED ORDER — SODIUM CHLORIDE 0.9% FLUSH
3.0000 mL | Freq: Two times a day (BID) | INTRAVENOUS | Status: DC
  Administered 2022-11-13 – 2022-11-14 (×2): 3 mL via INTRAVENOUS

## 2022-11-13 MED ORDER — IOHEXOL 350 MG/ML SOLN
INTRAVENOUS | Status: DC | PRN
  Administered 2022-11-13: 120 mL

## 2022-11-13 MED ORDER — HEPARIN BOLUS VIA INFUSION
4000.0000 [IU] | Freq: Once | INTRAVENOUS | Status: AC
  Administered 2022-11-13: 4000 [IU] via INTRAVENOUS
  Filled 2022-11-13: qty 4000

## 2022-11-13 MED ORDER — HEPARIN (PORCINE) IN NACL 1000-0.9 UT/500ML-% IV SOLN
INTRAVENOUS | Status: DC | PRN
  Administered 2022-11-13 (×2): 500 mL

## 2022-11-13 MED ORDER — SODIUM CHLORIDE 0.9% FLUSH: 3.0000 mL | INTRAVENOUS | Status: DC | PRN

## 2022-11-13 MED ORDER — ADULT MULTIVITAMIN W/MINERALS CH
1.0000 | ORAL_TABLET | Freq: Every day | ORAL | Status: DC
  Administered 2022-11-14: 1 via ORAL
  Filled 2022-11-13: qty 1

## 2022-11-13 MED ORDER — FENTANYL CITRATE (PF) 100 MCG/2ML IJ SOLN
INTRAMUSCULAR | Status: AC
  Filled 2022-11-13: qty 2

## 2022-11-13 MED ORDER — SODIUM CHLORIDE 0.9 % IV SOLN: INTRAVENOUS | Status: AC

## 2022-11-13 MED ORDER — VERAPAMIL HCL 2.5 MG/ML IV SOLN
INTRAVENOUS | Status: DC | PRN
  Administered 2022-11-13: 10 mL via INTRA_ARTERIAL

## 2022-11-13 MED ORDER — METOPROLOL TARTRATE 12.5 MG HALF TABLET
12.5000 mg | ORAL_TABLET | Freq: Two times a day (BID) | ORAL | Status: DC
  Administered 2022-11-13: 12.5 mg via ORAL
  Filled 2022-11-13 (×2): qty 1

## 2022-11-13 MED ORDER — MIDAZOLAM HCL 2 MG/2ML IJ SOLN
INTRAMUSCULAR | Status: AC
  Filled 2022-11-13: qty 2

## 2022-11-13 MED ORDER — NITROGLYCERIN IN D5W 200-5 MCG/ML-% IV SOLN
0.0000 ug/min | INTRAVENOUS | Status: DC
  Administered 2022-11-13: 10 ug/min via INTRAVENOUS
  Filled 2022-11-13: qty 250

## 2022-11-13 MED ORDER — ONDANSETRON HCL 4 MG/2ML IJ SOLN
INTRAMUSCULAR | Status: DC | PRN
  Administered 2022-11-13: 4 mg via INTRAVENOUS

## 2022-11-13 MED ORDER — HEPARIN (PORCINE) 25000 UT/250ML-% IV SOLN
1000.0000 [IU]/h | INTRAVENOUS | Status: DC
  Administered 2022-11-13: 1000 [IU]/h via INTRAVENOUS
  Filled 2022-11-13: qty 250

## 2022-11-13 MED ORDER — ACETAMINOPHEN 325 MG PO TABS: 650.0000 mg | ORAL_TABLET | ORAL | Status: DC | PRN

## 2022-11-13 MED ORDER — NITROGLYCERIN 1 MG/10 ML FOR IR/CATH LAB
INTRA_ARTERIAL | Status: AC
  Filled 2022-11-13: qty 10

## 2022-11-13 MED ORDER — ASPIRIN 81 MG PO TBEC: 81.0000 mg | DELAYED_RELEASE_TABLET | Freq: Every day | ORAL | Status: DC

## 2022-11-13 MED ORDER — DOPAMINE-DEXTROSE 3.2-5 MG/ML-% IV SOLN
INTRAVENOUS | Status: AC
  Filled 2022-11-13: qty 250

## 2022-11-13 MED ORDER — SODIUM CHLORIDE 0.9 % WEIGHT BASED INFUSION: 1.0000 mL/kg/h | INTRAVENOUS | Status: DC

## 2022-11-13 MED ORDER — CLOPIDOGREL BISULFATE 75 MG PO TABS
75.0000 mg | ORAL_TABLET | Freq: Every day | ORAL | Status: DC
  Administered 2022-11-13 – 2022-11-14 (×2): 75 mg via ORAL
  Filled 2022-11-13 (×2): qty 1

## 2022-11-13 MED ORDER — ATORVASTATIN CALCIUM 80 MG PO TABS
80.0000 mg | ORAL_TABLET | Freq: Every day | ORAL | Status: DC
  Administered 2022-11-14: 80 mg via ORAL
  Filled 2022-11-13: qty 1

## 2022-11-13 MED ORDER — ASPIRIN 81 MG PO CHEW: 81.0000 mg | CHEWABLE_TABLET | ORAL | Status: DC

## 2022-11-13 MED ORDER — HEPARIN (PORCINE) IN NACL 1000-0.9 UT/500ML-% IV SOLN
INTRAVENOUS | Status: AC
  Filled 2022-11-13: qty 1000

## 2022-11-13 MED ORDER — VERAPAMIL HCL 2.5 MG/ML IV SOLN
INTRAVENOUS | Status: AC
  Filled 2022-11-13: qty 2

## 2022-11-13 MED ORDER — NITROGLYCERIN 0.4 MG SL SUBL
0.4000 mg | SUBLINGUAL_TABLET | SUBLINGUAL | Status: DC | PRN
  Administered 2022-11-13 (×3): 0.4 mg via SUBLINGUAL
  Filled 2022-11-13: qty 1

## 2022-11-13 MED ORDER — FENTANYL CITRATE (PF) 100 MCG/2ML IJ SOLN
INTRAMUSCULAR | Status: DC | PRN
  Administered 2022-11-13: 25 ug via INTRAVENOUS

## 2022-11-13 MED ORDER — MIDAZOLAM HCL 2 MG/2ML IJ SOLN
INTRAMUSCULAR | Status: DC | PRN
  Administered 2022-11-13: 1 mg via INTRAVENOUS

## 2022-11-13 MED ORDER — LISINOPRIL 10 MG PO TABS
10.0000 mg | ORAL_TABLET | Freq: Every day | ORAL | Status: DC
  Filled 2022-11-13: qty 1

## 2022-11-13 SURGICAL SUPPLY — 23 items
BALL SAPPHIRE NC24 2.75X8 (BALLOONS) ×2
BALL SAPPHIRE NC24 3.25X18 (BALLOONS) ×1
BALLN EMERGE MR 2.0X12 (BALLOONS) ×1
BALLOON EMERGE MR 2.0X12 (BALLOONS) IMPLANT
BALLOON SAPPHIRE NC24 2.75X8 (BALLOONS) IMPLANT
BALLOON SAPPHIRE NC24 3.25X18 (BALLOONS) IMPLANT
CATH 5FR JL3.5 JR4 ANG PIG MP (CATHETERS) IMPLANT
CATH LAUNCHER 6FR JR4 (CATHETERS) IMPLANT
DEVICE RAD COMP TR BAND LRG (VASCULAR PRODUCTS) IMPLANT
ELECT DEFIB PAD ADLT CADENCE (PAD) IMPLANT
GLIDESHEATH SLEND SS 6F .021 (SHEATH) IMPLANT
GUIDEWIRE INQWIRE 1.5J.035X260 (WIRE) IMPLANT
INQWIRE 1.5J .035X260CM (WIRE) ×1
KIT ENCORE 26 ADVANTAGE (KITS) IMPLANT
KIT HEART LEFT (KITS) ×1 IMPLANT
PACK CARDIAC CATHETERIZATION (CUSTOM PROCEDURE TRAY) ×1 IMPLANT
SHEATH PROBE COVER 6X72 (BAG) IMPLANT
STENT ONYX FRONTIER 2.5X12 (Permanent Stent) IMPLANT
STENT SYNERGY XD 3.0X38 (Permanent Stent) IMPLANT
SYNERGY XD 3.0X38 (Permanent Stent) ×1 IMPLANT
TRANSDUCER W/STOPCOCK (MISCELLANEOUS) ×1 IMPLANT
TUBING CIL FLEX 10 FLL-RA (TUBING) ×1 IMPLANT
WIRE ASAHI PROWATER 180CM (WIRE) IMPLANT

## 2022-11-13 NOTE — ED Notes (Signed)
Patients BP on the montior reading systolic in the 52B. Patients BP cuff was placed on right arm which patient stated her parkinsons affects more. Patients BP cuff was removed from right arm, changed to a smaller cuff, and placed on the left arm at this time. Patients BP reading systolic in the 910 range.

## 2022-11-13 NOTE — Interval H&P Note (Signed)
History and Physical Interval Note:  11/13/2022 9:19 AM  Julie Carey  has presented today for surgery, with the diagnosis of nstemi.  The various methods of treatment have been discussed with the patient and family. After consideration of risks, benefits and other options for treatment, the patient has consented to  Procedure(s): LEFT HEART CATH AND CORONARY ANGIOGRAPHY (N/A) as a surgical intervention.  The patient's history has been reviewed, patient examined, no change in status, stable for surgery.  I have reviewed the patient's chart and labs.  Questions were answered to the patient's satisfaction.    Cath Lab Visit (complete for each Cath Lab visit)  Clinical Evaluation Leading to the Procedure:   ACS: Yes.    Non-ACS:    Anginal Classification: CCS IV  Anti-ischemic medical therapy: Minimal Therapy (1 class of medications)  Non-Invasive Test Results: No non-invasive testing performed  Prior CABG: No previous CABG       Collier Salina [facility] 11/13/2022 9:19 AM

## 2022-11-13 NOTE — ED Notes (Signed)
Patient complaining of chest pain 4/10 in pain.

## 2022-11-13 NOTE — ED Notes (Signed)
Patient purewick checked at this time and patients bed saturated. Patient cleaned, wet clothing removed, bed cleaned, clean dry gown placed, new sheets applied and purewick cannister checked for patency.

## 2022-11-13 NOTE — ED Provider Notes (Signed)
7:3 AM Nursing requested I come evaluate patient who is having worsened chest pain.  For approximately 30 minutes patient has worsened chest pressure in her central chest that is an 8 out of 10 in severity.  She has received several nitroglycerin without significant relief.  She describes it feeling similar to what she was having earlier when her troponin went from 26-110.  She is current on heparin awaiting admission to cardiology.  Nursing tried to contact cardiology initially but was unable to contact the correct team.  Will give some morphine and get a repeat troponin.  I reviewed EKG and did not see evidence of STEMI.  Nursing reportedly saw evidence of SVT initially but was not captured on telemetry.  Will contact cardiology to discuss them reassessing patient.   Latoyia Tecson, Gwenyth Allegra, MD 11/13/22 1016

## 2022-11-13 NOTE — H&P (View-Only) (Signed)
Rounding Note    Patient Name: Julie Carey Date of Encounter: 11/13/2022  [place] Cardiologist: Quay Burow, MD   Subjective   Patient continues to have chest pain.  BP not well controlled currently.  Repeat EKG this AM without acute ischemia.  Inpatient Medications    Scheduled Meds:  aspirin  81 mg Oral Pre-Cath   aspirin EC  81 mg Oral Daily   atorvastatin  80 mg Oral Daily   calcium carbonate  1 tablet Oral Q breakfast   Carbidopa-Levodopa ER  1 tablet Oral q1600   Carbidopa-Levodopa ER  2 tablet Oral BID   citalopram  10 mg Oral QHS   clopidogrel  75 mg Oral Daily   levothyroxine  75 mcg Oral QAC breakfast   lisinopril  10 mg Oral Daily   metoprolol tartrate  12.5 mg Oral BID   multivitamin with minerals  1 tablet Oral Daily   pramipexole  0.5 mg Oral TID   sodium chloride flush  3 mL Intravenous Q12H   zinc sulfate  220 mg Oral Daily   Continuous Infusions:  sodium chloride     sodium chloride     Followed by   sodium chloride     heparin 1,000 Units/hr (11/13/22 0217)   nitroGLYCERIN     PRN Meds: sodium chloride, acetaminophen, LORazepam, nitroGLYCERIN, ondansetron (ZOFRAN) IV, sodium chloride flush   Vital Signs    Vitals:   11/13/22 0622 11/13/22 0630 11/13/22 0645 11/13/22 0730  BP:  125/75 125/67 (!) 149/73  Pulse:  71 63 74  Resp:  '16 15 18  '$ Temp: 98.7 F (37.1 C)   98 F (36.7 C)  TempSrc:    Oral  SpO2:  97% 97% 99%  Weight:      Height:       No intake or output data in the 24 hours ending 11/13/22 0901    11/13/2022    1:00 AM 09/25/2022    3:12 PM 03/20/2022    2:32 PM  Last 3 Weights  Weight (lbs) 210 lb 210 lb 6.4 oz 214 lb  Weight (kg) 95.255 kg 95.437 kg 97.07 kg      Telemetry    SR with PVCs- Personally Reviewed  ECG    SR with acute ischemic changes - Personally Reviewed  Physical Exam   GEN: No acute distress.   Neck: No JVD Cardiac: RRR, no murmurs, rubs, or gallops.  Respiratory: Clear to  auscultation bilaterally. GI: Soft, nontender, non-distended  MS: No edema; No deformity. Neuro:  Nonfocal  Psych: Normal affect  Vasc:  +2 radial pulses bilaterally  Labs    High Sensitivity Troponin:   Recent Labs  Lab 11/12/22 2155 11/13/22 0026  TROPONINIHS 23* 110*     Chemistry Recent Labs  Lab 11/12/22 2155  NA 132*  K 4.0  CL 100  CO2 23  GLUCOSE 130*  BUN 21  CREATININE 0.79  CALCIUM 9.0  PROT 6.6  ALBUMIN 4.0  AST 24  ALT 7  ALKPHOS 64  BILITOT 0.5  GFRNONAA >60  ANIONGAP 9    Lipids No results for input(s): "CHOL", "TRIG", "HDL", "LABVLDL", "LDLCALC", "CHOLHDL" in the last 168 hours.  Hematology Recent Labs  Lab 11/12/22 2155  WBC 11.3*  RBC 4.22  HGB 12.9  HCT 37.4  MCV 88.6  MCH 30.6  MCHC 34.5  RDW 12.8  PLT 321   Thyroid No results for input(s): "TSH", "FREET4" in the last 168 hours.  BNPNo results for input(s): "BNP", "PROBNP" in the last 168 hours.  DDimer No results for input(s): "DDIMER" in the last 168 hours.   Radiology    DG Chest Portable 1 View  Result Date: 11/12/2022 CLINICAL DATA:  Chest pain EXAM: PORTABLE CHEST 1 VIEW COMPARISON:  10/22/2010 FINDINGS: The heart size and mediastinal contours are within normal limits. Both lungs are clear. The visualized skeletal structures are unremarkable. IMPRESSION: No active disease. Electronically Signed   By: Fidela Salisbury M.D.   On: 11/12/2022 22:55    Cardiac Studies   PCI of left AV Lcx with 2.5 Promus DES 2012  Patient Profile     72 y.o. female CAD s/p PCI LCX 2012, HTN, HL, Parkinsons here with ACS.  Assessment & Plan     ACS:  Continuing to have ongoing symptoms.  Starting nitro gtt now.  Continue hep gtt.  Will refer for urgent coronary angiography.  Case discussed with Dr. Martinique. HTN:  Start nitro gtt for now HL:  Cont high dose atorvastatin Parkinson's:  Pretty functional despite this diagnosis.  I have reviewed the risks, indications, and alternatives to  cardiac catheterization, possible angioplasty, and stenting with the patient. Risks include but are not limited to bleeding, infection, vascular injury, stroke, myocardial infection, arrhythmia, kidney injury, radiation-related injury in the case of prolonged fluoroscopy use, emergency cardiac surgery, and death. The patient understands the risks of serious complication is 1-2 in 2956 with diagnostic cardiac cath and 1-2% or less with angioplasty/stenting.    For questions or updates, please contact [place] Please consult [email or web address] for contact info under        Signed, Early Osmond, MD  11/13/2022, 9:01 AM

## 2022-11-13 NOTE — Progress Notes (Signed)
ANTICOAGULATION CONSULT NOTE - Initial Consult  Pharmacy Consult for heparin Indication: chest pain/ACS  No Known Allergies  Patient Measurements: Height: 5' 2.5" (158.8 cm) Weight: 95.3 kg (210 lb) IBW/kg (Calculated) : 51.25 Heparin Dosing Weight: 75kg  Vital Signs: Temp: 98.7 F (37.1 C) (01/22 2148) BP: 100/48 (01/23 0030) Pulse Rate: 67 (01/23 0030)  Labs: Recent Labs    11/12/22 2155 11/12/22 2212 11/13/22 0026  HGB 12.9  --   --   HCT 37.4  --   --   PLT 321  --   --   LABPROT  --  13.4  --   INR  --  1.0  --   CREATININE 0.79  --   --   TROPONINIHS 23*  --  110*    Estimated Creatinine Clearance: 71.2 mL/min (by C-G formula based on SCr of 0.79 mg/dL).   Medical History: Past Medical History:  Diagnosis Date   Coronary artery disease    Family history of heart disease    Hyperlipidemia    Hypertension     Assessment: 72yo female c/o CP that was relieved w/ ASA and NTG prior to EMS arrival, initial troponin mildly elevated and now rising >> to begin heparin.  Goal of Therapy:  Heparin level 0.3-0.7 units/ml Monitor platelets by anticoagulation protocol: Yes   Plan:  Heparin 4000 units IV bolus x1 followed by infusion at 1000 units/hr. Monitor heparin levels and CBC.  Wynona Neat, PharmD, BCPS  11/13/2022,1:33 AM

## 2022-11-13 NOTE — Progress Notes (Signed)
Rounding Note    Patient Name: Julie Carey Date of Encounter: 11/13/2022  [place] Cardiologist: Quay Burow, MD   Subjective   Patient continues to have chest pain.  BP not well controlled currently.  Repeat EKG this AM without acute ischemia.  Inpatient Medications    Scheduled Meds:  aspirin  81 mg Oral Pre-Cath   aspirin EC  81 mg Oral Daily   atorvastatin  80 mg Oral Daily   calcium carbonate  1 tablet Oral Q breakfast   Carbidopa-Levodopa ER  1 tablet Oral q1600   Carbidopa-Levodopa ER  2 tablet Oral BID   citalopram  10 mg Oral QHS   clopidogrel  75 mg Oral Daily   levothyroxine  75 mcg Oral QAC breakfast   lisinopril  10 mg Oral Daily   metoprolol tartrate  12.5 mg Oral BID   multivitamin with minerals  1 tablet Oral Daily   pramipexole  0.5 mg Oral TID   sodium chloride flush  3 mL Intravenous Q12H   zinc sulfate  220 mg Oral Daily   Continuous Infusions:  sodium chloride     sodium chloride     Followed by   sodium chloride     heparin 1,000 Units/hr (11/13/22 0217)   nitroGLYCERIN     PRN Meds: sodium chloride, acetaminophen, LORazepam, nitroGLYCERIN, ondansetron (ZOFRAN) IV, sodium chloride flush   Vital Signs    Vitals:   11/13/22 0622 11/13/22 0630 11/13/22 0645 11/13/22 0730  BP:  125/75 125/67 (!) 149/73  Pulse:  71 63 74  Resp:  '16 15 18  '$ Temp: 98.7 F (37.1 C)   98 F (36.7 C)  TempSrc:    Oral  SpO2:  97% 97% 99%  Weight:      Height:       No intake or output data in the 24 hours ending 11/13/22 0901    11/13/2022    1:00 AM 09/25/2022    3:12 PM 03/20/2022    2:32 PM  Last 3 Weights  Weight (lbs) 210 lb 210 lb 6.4 oz 214 lb  Weight (kg) 95.255 kg 95.437 kg 97.07 kg      Telemetry    SR with PVCs- Personally Reviewed  ECG    SR with acute ischemic changes - Personally Reviewed  Physical Exam   GEN: No acute distress.   Neck: No JVD Cardiac: RRR, no murmurs, rubs, or gallops.  Respiratory: Clear to  auscultation bilaterally. GI: Soft, nontender, non-distended  MS: No edema; No deformity. Neuro:  Nonfocal  Psych: Normal affect  Vasc:  +2 radial pulses bilaterally  Labs    High Sensitivity Troponin:   Recent Labs  Lab 11/12/22 2155 11/13/22 0026  TROPONINIHS 23* 110*     Chemistry Recent Labs  Lab 11/12/22 2155  NA 132*  K 4.0  CL 100  CO2 23  GLUCOSE 130*  BUN 21  CREATININE 0.79  CALCIUM 9.0  PROT 6.6  ALBUMIN 4.0  AST 24  ALT 7  ALKPHOS 64  BILITOT 0.5  GFRNONAA >60  ANIONGAP 9    Lipids No results for input(s): "CHOL", "TRIG", "HDL", "LABVLDL", "LDLCALC", "CHOLHDL" in the last 168 hours.  Hematology Recent Labs  Lab 11/12/22 2155  WBC 11.3*  RBC 4.22  HGB 12.9  HCT 37.4  MCV 88.6  MCH 30.6  MCHC 34.5  RDW 12.8  PLT 321   Thyroid No results for input(s): "TSH", "FREET4" in the last 168 hours.  BNPNo results for input(s): "BNP", "PROBNP" in the last 168 hours.  DDimer No results for input(s): "DDIMER" in the last 168 hours.   Radiology    DG Chest Portable 1 View  Result Date: 11/12/2022 CLINICAL DATA:  Chest pain EXAM: PORTABLE CHEST 1 VIEW COMPARISON:  10/22/2010 FINDINGS: The heart size and mediastinal contours are within normal limits. Both lungs are clear. The visualized skeletal structures are unremarkable. IMPRESSION: No active disease. Electronically Signed   By: Fidela Salisbury M.D.   On: 11/12/2022 22:55    Cardiac Studies   PCI of left AV Lcx with 2.5 Promus DES 2012  Patient Profile     72 y.o. female CAD s/p PCI LCX 2012, HTN, HL, Parkinsons here with ACS.  Assessment & Plan     ACS:  Continuing to have ongoing symptoms.  Starting nitro gtt now.  Continue hep gtt.  Will refer for urgent coronary angiography.  Case discussed with Dr. Martinique. HTN:  Start nitro gtt for now HL:  Cont high dose atorvastatin Parkinson's:  Pretty functional despite this diagnosis.  I have reviewed the risks, indications, and alternatives to  cardiac catheterization, possible angioplasty, and stenting with the patient. Risks include but are not limited to bleeding, infection, vascular injury, stroke, myocardial infection, arrhythmia, kidney injury, radiation-related injury in the case of prolonged fluoroscopy use, emergency cardiac surgery, and death. The patient understands the risks of serious complication is 1-2 in 7948 with diagnostic cardiac cath and 1-2% or less with angioplasty/stenting.    For questions or updates, please contact [place] Please consult [email or web address] for contact info under        Signed, Early Osmond, MD  11/13/2022, 9:01 AM

## 2022-11-13 NOTE — H&P (Signed)
Cardiology Admission History and Physical   Patient ID: Julie Carey MRN: [id number]; DOB: Mar 20, 1951   Admission date: 11/12/2022  PCP:  Carol [facility], [place] [facility] Cardiologist:  Quay Burow, MD        Chief Complaint:  Chest pain  Patient Profile:   Julie Carey is a 72 y.o. female with pmh sx for HTN, [name], Parkinson, hypothyroidism and MI/ CAD, s/p stent placement on ASA + plavix who is being seen 11/13/2022 for the evaluation of NSTEMI.  History of Present Illness:   Julie Carey is a 72 y.o. female with pmh sx for HTN, [name], Parkinson, hypothyroidism and MI/ CAD, s/p stent placement on ASA + plavix who is being seen 11/13/2022 for the evaluation of NSTEMI. She had a non-STEMI 10/24/2010 and ultimate stenting of her AV groove circumflex by Dr. Ellouise Newer with a drug-eluting stent. She had moderate but noncritical disease in her LAD and RCA. She has been doing very well since then. Today around 3 pm she developed chest pain which persisted till 7 pm. She thought it will go away- but it didn't. No radiation of pain- it was central. Mild SOB and nausea associated with it. Did not improve with 2 doses of lorazepam, was relieved with 1 nitroglycerin around 6 PM. Patient denies any alleviating or aggravating factors for her symptoms. In the ED, troponin was elevated to 110. Rest of the labs were reassuring. Cardiology was called for admission.    Past Medical History:  Diagnosis Date   Coronary artery disease    Family history of heart disease    Hyperlipidemia    Hypertension     Past Surgical History:  Procedure Laterality Date   CATARACT EXTRACTION, BILATERAL     CORONARY ANGIOPLASTY WITH STENT PLACEMENT  10/24/2010   mid circ.AV groove 80%narrowing, [id number] Promus Element stent , entire region reduced to 0%   NM MYOCAR PERF WALL MOTION  12/27/2010   protocol:Bruce, EF 74%,no evidence of ischemia, exercise cap. 7 METS   TRANSTHORACIC ECHOCARDIOGRAM   10/23/2010   Ef 50-55%,all cavity sizes normal, wall thickness normal, no regurg. n     Medications Prior to Admission: Prior to Admission medications   Medication Sig Start Date End Date Taking? Authorizing Provider  aspirin 81 MG tablet Take 81 mg by mouth daily.    Yes [provider]  B Complex-C (B-COMPLEX WITH VITAMIN C) tablet Take 1 tablet by mouth daily.   Yes [provider]  calcium carbonate (OSCAL) 1500 (600 Ca) MG TABS tablet Take 1 tablet by mouth daily with breakfast.   Yes [provider]  Carbidopa-Levodopa ER (SINEMET CR) 25-100 MG tablet controlled release TAKE 2 TABLETS BY MOUTH AT 7AM, 2 TABLETS AT 11AM AND TAKE 1 TABLET AT 4 PM Patient taking differently: Take 1-2 tablets by mouth See admin instructions. TAKE 2 TABLETS BY MOUTH AT 7AM, 2 TABLETS AT 11AM AND TAKE 1 TABLET AT 4 PM 11/12/22  Yes Tat, REustace Quail DO  citalopram (CELEXA) 10 MG tablet Take 10 mg by mouth at bedtime.   Yes [provider]  clopidogrel (PLAVIX) 75 MG tablet TAKE 1 TABLET(75 MG) BY MOUTH DAILY Patient taking differently: Take 75 mg by mouth daily. 03/05/22  Yes BLorretta Harp MD  levothyroxine (SYNTHROID, LEVOTHROID) 75 MCG tablet TAKE 1 TABLET BY MOUTH DAILY BEFORE BREAKFAST 08/08/17  Yes BLorretta Harp MD  lisinopril (PRINIVIL,ZESTRIL) 10 MG tablet Take 1 tablet (10 mg total)  by mouth daily. 07/26/16  Yes Lorretta Harp, MD  LORazepam (ATIVAN) 0.5 MG tablet Take 0.5 mg by mouth daily as needed for anxiety. 06/28/20  Yes [provider]  metoprolol tartrate (LOPRESSOR) 25 MG tablet TAKE 1 TABLET(25 MG) BY MOUTH TWICE DAILY Patient taking differently: Take 25 mg by mouth 2 (two) times daily. 09/17/22  Yes Lorretta Harp, MD  Multiple Vitamin (MULTIVITAMIN) tablet Take 1 tablet by mouth daily.   Yes [provider]  pramipexole (MIRAPEX) 0.5 MG tablet TAKE 1 TABLET(0.5 MG) BY MOUTH THREE TIMES DAILY Patient taking differently: Take 0.5  mg by mouth 3 (three) times daily. 06/01/22  Yes Tat, Eustace Quail, DO  simvastatin (ZOCOR) 40 MG tablet TAKE 1 TABLET(40 MG) BY MOUTH DAILY Patient taking differently: Take 40 mg by mouth daily. 11/02/22  Yes Lorretta Harp, MD  Zinc 100 MG TABS Take 100 mg by mouth daily.   Yes [provider]     Allergies:   No Known Allergies  Social History:   Social History   Socioeconomic History   Marital status: Married    Spouse name: Not on file   Number of children: 3   Years of education: Not on file   Highest education level: Not on file  Occupational History    Comment: taxes and bookkeeping  Tobacco Use   Smoking status: Never   Smokeless tobacco: Never  Vaping Use   Vaping Use: Never used  Substance and Sexual Activity   Alcohol use: Never   Drug use: Never   Sexual activity: Not on file  Other Topics Concern   Not on file  Social History Narrative   Right handed   Lives in a one story home    Social Determinants of Health   Financial Resource Strain: Not on file  Food Insecurity: Not on file  Transportation Needs: Not on file  Physical Activity: Not on file  Stress: Not on file  Social Connections: Not on file  Intimate Partner Violence: Not on file    Family History:   The patient's family history includes Heart failure in her father; Hydrocephalus in her brother; Hypertension in her mother; Stroke in her mother.    ROS:  Please see the history of present illness.  All other ROS reviewed and negative.     Physical Exam/Data:   Vitals:   11/13/22 0100 11/13/22 0219 11/13/22 0235 11/13/22 0345  BP:   (!) 106/59 127/63  Pulse:   68 73  Resp:   18 16  Temp:  98.1 F (36.7 C)    TempSrc:  Oral    SpO2:   96% 97%  Weight: 95.3 kg     Height: 5' 2.5" (1.588 m)      No intake or output data in the 24 hours ending 11/13/22 0352    11/13/2022    1:00 AM 09/25/2022    3:12 PM 03/20/2022    2:32 PM  Last 3 Weights  Weight (lbs) 210 lb 210 lb 6.4 oz  214 lb  Weight (kg) 95.255 kg 95.437 kg 97.07 kg     Body mass index is 37.8 kg/m.  General:  Well nourished, well developed, in no acute distress HEENT: normal Neck: no JVD Vascular: No carotid bruits; Distal pulses 2+ bilaterally   Cardiac:  normal S1, S2; RRR; no murmur  Lungs:  clear to auscultation bilaterally, no wheezing, rhonchi or rales  Abd: soft, nontender, no hepatomegaly  Ext: no edema Musculoskeletal:  No deformities, BUE and BLE strength normal and equal Skin: warm and dry  Neuro:  CNs 2-12 intact, no focal abnormalities noted Psych:  Normal affect    EKG:  The ECG that was done  was personally reviewed and demonstrates no ST elevation  Relevant CV Studies:  Nuclear Scan in 2012 which showed no ischemia post stent She had a non-STEMI 10/24/2010 and ultimate stenting of her AV groove circumflex by Dr. Ellouise Newer with a drug-eluting stent. She had moderate but noncritical disease in her LAD and RCA.   Laboratory Data:  High Sensitivity Troponin:   Recent Labs  Lab 11/12/22 2155 11/13/22 0026  TROPONINIHS 23* 110*      Chemistry Recent Labs  Lab 11/12/22 2155  NA 132*  K 4.0  CL 100  CO2 23  GLUCOSE 130*  BUN 21  CREATININE 0.79  CALCIUM 9.0  GFRNONAA >60  ANIONGAP 9    Recent Labs  Lab 11/12/22 2155  PROT 6.6  ALBUMIN 4.0  AST 24  ALT 7  ALKPHOS 64  BILITOT 0.5   Lipids No results for input(s): "CHOL", "TRIG", "HDL", "LABVLDL", "LDLCALC", "CHOLHDL" in the last 168 hours. Hematology Recent Labs  Lab 11/12/22 2155  WBC 11.3*  RBC 4.22  HGB 12.9  HCT 37.4  MCV 88.6  MCH 30.6  MCHC 34.5  RDW 12.8  PLT 321   Thyroid No results for input(s): "TSH", "FREET4" in the last 168 hours. BNPNo results for input(s): "BNP", "PROBNP" in the last 168 hours.  DDimer No results for input(s): "DDIMER" in the last 168 hours.   Radiology/Studies:  DG Chest Portable 1 View  Result Date: 11/12/2022 CLINICAL DATA:  Chest pain EXAM: PORTABLE CHEST 1  VIEW COMPARISON:  10/22/2010 FINDINGS: The heart size and mediastinal contours are within normal limits. Both lungs are clear. The visualized skeletal structures are unremarkable. IMPRESSION: No active disease. Electronically Signed   By: Fidela Salisbury M.D.   On: 11/12/2022 22:55     Assessment and Plan:   # CAD s/p stent in 2012 # NSTEMI # HTN # [name] # Parkinson disease # Hypothyroidism  -She had a non-STEMI 10/24/2010 and ultimate stenting of her AV groove circumflex by Dr. Ellouise Newer with a drug-eluting stent. She had moderate but noncritical disease in her LAD and RCA.  -Now coming with CP and elevated troponin concerning for NSTEMI -Will start Heparin -She is already on aspirin and plavix- will continue -Atorvastatin 80 mg  -Metoprolol 12.5 mg BID -Echo in AM -LHC in AM for NSTEMI -Telemetry -Continue Lisinopril -Continue Synthroid -Continue Home Parkinson medications.   For questions or updates, please contact [place] Please consult [email or web address] for contact info under     Signed, Jaci Lazier, MD  11/13/2022 3:52 AM

## 2022-11-13 NOTE — Progress Notes (Signed)
Patient arrived to room [phone] with family at bedside. Patient A/O x4, VS stable, patient free from pain. Patient oriented to room and call bell in reach.

## 2022-11-14 ENCOUNTER — Telehealth: Payer: Self-pay | Admitting: Physician Assistant

## 2022-11-14 ENCOUNTER — Encounter (HOSPITAL_COMMUNITY): Payer: Self-pay | Admitting: Internal Medicine

## 2022-11-14 ENCOUNTER — Inpatient Hospital Stay (HOSPITAL_COMMUNITY): Payer: PPO

## 2022-11-14 DIAGNOSIS — R9431 Abnormal electrocardiogram [ECG] [EKG]: Secondary | ICD-10-CM

## 2022-11-14 DIAGNOSIS — I5031 Acute diastolic (congestive) heart failure: Secondary | ICD-10-CM | POA: Insufficient documentation

## 2022-11-14 DIAGNOSIS — I214 Non-ST elevation (NSTEMI) myocardial infarction: Secondary | ICD-10-CM | POA: Diagnosis not present

## 2022-11-14 DIAGNOSIS — E039 Hypothyroidism, unspecified: Secondary | ICD-10-CM | POA: Insufficient documentation

## 2022-11-14 LAB — CBC
HCT: 37.4 % (ref 36.0–46.0)
Hemoglobin: 12.6 g/dL (ref 12.0–15.0)
MCH: 30.4 pg (ref 26.0–34.0)
MCHC: 33.7 g/dL (ref 30.0–36.0)
MCV: 90.3 fL (ref 80.0–100.0)
Platelets: 299 10*3/uL (ref 150–400)
RBC: 4.14 MIL/uL (ref 3.87–5.11)
RDW: 13.2 % (ref 11.5–15.5)
WBC: 7.3 10*3/uL (ref 4.0–10.5)
nRBC: 0 % (ref 0.0–0.2)

## 2022-11-14 LAB — BASIC METABOLIC PANEL
Anion gap: 10 (ref 5–15)
BUN: 20 mg/dL (ref 8–23)
CO2: 24 mmol/L (ref 22–32)
Calcium: 8.9 mg/dL (ref 8.9–10.3)
Chloride: 102 mmol/L (ref 98–111)
Creatinine, Ser: 0.88 mg/dL (ref 0.44–1.00)
GFR, Estimated: >60 mL/min (ref >60)
Glucose, Bld: 98 mg/dL (ref 70–99)
Potassium: 3.9 mmol/L (ref 3.5–5.1)
Sodium: 136 mmol/L (ref 135–145)

## 2022-11-14 LAB — ECHOCARDIOGRAM COMPLETE
Area-P 1/2: 3.85 cm2
Calc EF: 61.8 %
Height: 62.5 in
S' Lateral: 3.2 cm
Single Plane A2C EF: 62.7 %
Single Plane A4C EF: 61.5 %
Weight: 3312.19 oz

## 2022-11-14 MED ORDER — CLOPIDOGREL BISULFATE 75 MG PO TABS: 75.0000 mg | ORAL_TABLET | Freq: Every day | ORAL | 3 refills | Status: DC

## 2022-11-14 MED ORDER — FUROSEMIDE 10 MG/ML IJ SOLN
40.0000 mg | Freq: Once | INTRAMUSCULAR | Status: AC
  Administered 2022-11-14: 40 mg via INTRAVENOUS
  Filled 2022-11-14: qty 4

## 2022-11-14 MED ORDER — POTASSIUM CHLORIDE CRYS ER 20 MEQ PO TBCR
40.0000 meq | EXTENDED_RELEASE_TABLET | Freq: Once | ORAL | Status: AC
  Administered 2022-11-14: 40 meq via ORAL
  Filled 2022-11-14: qty 2

## 2022-11-14 MED ORDER — ATORVASTATIN CALCIUM 80 MG PO TABS: 80.0000 mg | ORAL_TABLET | Freq: Every day | ORAL | 5 refills | Status: DC

## 2022-11-14 MED ORDER — NITROGLYCERIN 0.4 MG SL SUBL: 0.4000 mg | SUBLINGUAL_TABLET | SUBLINGUAL | 3 refills | Status: AC | PRN

## 2022-11-14 MED ORDER — METOPROLOL TARTRATE 25 MG PO TABS
25.0000 mg | ORAL_TABLET | Freq: Two times a day (BID) | ORAL | Status: DC
  Administered 2022-11-14: 25 mg via ORAL
  Filled 2022-11-14: qty 1

## 2022-11-14 MED FILL — Nitroglycerin IV Soln 100 MCG/ML in D5W: INTRA_ARTERIAL | Qty: 10 | Status: AC

## 2022-11-14 NOTE — Discharge Summary (Addendum)
Discharge Summary    Patient ID: Julie Carey MRN: [id number]; DOB: [birth year]  Admit date: 11/12/2022 Discharge date: 11/14/2022  PCP:  Carol Ada, [facility] Cardiologist:  Quay Burow, MD        Discharge Diagnoses    Principal Problem:   NSTEMI (non-ST elevated myocardial infarction) [facility]) Active Problems:   Coronary artery disease   Hyperlipidemia   Essential hypertension   Parkinson's disease   Hypothyroidism   Acute diastolic CHF (congestive heart failure) [facility])    Diagnostic Studies/Procedures    Cath 11/13/22   Prox LAD to Mid LAD lesion is 50% stenosed.   Mid Cx to Dist Cx lesion is 10% stenosed.   Prox RCA to Mid RCA lesion is 99% stenosed.   Dist RCA lesion is 90% stenosed.   A drug-eluting stent was successfully placed using a STENT ONYX FRONTIER 2.5X12.   A drug-eluting stent was successfully placed using a SYNERGY XD 3.0X38.   Post intervention, there is a 0% residual stenosis.   Post intervention, there is a 0% residual stenosis.   The left ventricular systolic function is normal.   LV end diastolic pressure is moderately elevated.   The left ventricular ejection fraction is 55-65% by visual estimate.   Single vessel obstructive CAD involving the mid and distal RCA. Prior stent in the LCx is widely patent Normal LV function Moderately elevated LVEDP 26 mm Hg Successful PCI of the mid and distal RCA with DES x 2   Plan: DAPT for at least 12 months. Anticipate DC tomorrow.   2D echo 11/14/22 1. Left ventricular ejection fraction, by estimation, is 60 to 65%. The  left ventricle has normal function. The left ventricle has no regional  wall motion abnormalities. Left ventricular diastolic parameters were  normal.   2. Right ventricular systolic function is normal. The right ventricular  size is normal.   3. The mitral valve is normal in structure. No evidence of mitral valve  regurgitation. No evidence of mitral  stenosis.   4. The aortic valve is tricuspid. There is moderate calcification of the  aortic valve. There is moderate thickening of the aortic valve. Aortic  valve regurgitation is not visualized. Aortic valve  sclerosis/calcification is present, without any evidence  of aortic stenosis.   5. The inferior vena cava is normal in size with greater than 50%  respiratory variability, suggesting right atrial pressure of 3 mmHg.  _____________     History of Present Illness     Julie Carey is a 72 y.o. female with CAD with NSTEMI 2012 s/p DES to AV groove-Cx, HTN, [name], Parkinson's disease, hypothyroidism presented to [facility] with CP, found to have NSTEMI. Remote cath  had shown moderate residual LAD/RCA disease. She had done well until the day of admission when she developed substernal chest pain that persisted for several hours prompting her to seek care in the ER. Troponin was elevated and she was admitted to the cardiology service.  Hospital Course     1. NSTEMI/CAD, with [name] - troponin peaked at 1,011 - underwent cath as above s/p DES x2 to mid and distal RCA, normal LVEDP, moderately elevated LVEDP 104mHg - continue DAPT with ASA + Plavix (on these PTA), for at least 12 months - ultimate duration will be at discretion of primary cardiologist beyond 12 months - 2D echo showed EF 60-65%, normal RV, aortic sclerosis without stenosis, normal diastolic parameters - simvastatin changed to high intensity atorvastatin -  If the patient is tolerating statin at time of follow-up appointment, would consider rechecking liver function/lipid panel in 6-8 weeks  2. Acute diastolic CHF - LVEDP [id number] indicative of diastolic HF, tx with one dose IV Lasix + potassium, not felt to require standing diuretic given relatively asymptomatic otherwise post-cath and echo was reassuring - may have been due to acute ischemia with NSTEMI   3. HTN - BP soft at times yesterday but has since stabilized, mildly  hypertensive this AM - giving Lasix today, will restart lisinopril in AM, continue home dose metoprolol at DC - dc BP 133/81   4. Parkinson's disease - continue home regimen   5. Hypothyroidism - continue OP f/u  Dr. Angelena Form has seen and examined the patient today and feels she is stable for discharge.     Did the patient have an acute coronary syndrome (MI, NSTEMI, STEMI, etc) this admission?:  Yes                               AHA/ACC Clinical Performance & Quality Measures: Aspirin prescribed? - Yes ADP Receptor Inhibitor (Plavix/Clopidogrel, Brilinta/Ticagrelor or Effient/Prasugrel) prescribed (includes medically managed patients)? - Yes Beta Blocker prescribed? - Yes High Intensity Statin (Lipitor 40-'80mg'$  or Crestor 20-'40mg'$ ) prescribed? - Yes EF assessed during THIS hospitalization? - Yes For EF <40%, was ACEI/ARB prescribed? - Not Applicable (EF >/= 75%) For EF <40%, Aldosterone Antagonist (Spironolactone or Eplerenone) prescribed? - Not Applicable (EF >/= 10%) Cardiac Rehab Phase II ordered (including medically managed patients)? - Yes       The patient will be scheduled for a TOC follow up appointment in 7-14 days.  A message has been sent to the [facility] Pool at the office where the patient should be seen for follow up.  _____________  Discharge Vitals Blood pressure 133/81, pulse 90, temperature 98.6 F (37 C), temperature source Oral, resp. rate 18, height 5' 2.5" (1.588 m), weight 93.9 kg, SpO2 98 %.  Filed Weights   11/13/22 0100 11/13/22 1403  Weight: 95.3 kg 93.9 kg    Labs & Radiologic Studies    CBC Recent Labs    11/12/22 2155 11/14/22 0547  WBC 11.3* 7.3  NEUTROABS 9.8*  --   HGB 12.9 12.6  HCT 37.4 37.4  MCV 88.6 90.3  PLT 321 258   Basic Metabolic Panel Recent Labs    11/12/22 2155 11/14/22 0547  NA 132* 136  K 4.0 3.9  CL 100 102  CO2 23 24  GLUCOSE 130* 98  BUN 21 20  CREATININE 0.79 0.88  CALCIUM 9.0 8.9   Liver Function  Tests Recent Labs    11/12/22 2155  AST 24  ALT 7  ALKPHOS 64  BILITOT 0.5  PROT 6.6  ALBUMIN 4.0   No results for input(s): "LIPASE", "AMYLASE" in the last 72 hours. High Sensitivity Troponin:   Recent Labs  Lab 11/12/22 2155 11/13/22 0026 11/13/22 0800 11/13/22 1450  TROPONINIHS 23* 110* 150* 1,011*    _____________  ECHOCARDIOGRAM COMPLETE  Result Date: 11/14/2022    ECHOCARDIOGRAM REPORT   Patient Name:   Julie Carey Date of Exam: 11/14/2022 Medical Rec #:  [id number]      Height:       62.5 in Accession #:    [id number]     Weight:       207.0 lb Date of Birth:  1951-08-11      BSA:  1.951 m Patient Age:    89 years       BP:           111/77 mmHg Patient Gender: F              HR:           77 bpm. Exam Location:  Inpatient Procedure: 2D Echo Indications:    Abnormal ECG  History:        Patient has prior history of Echocardiogram examinations, most                 recent 10/24/2011. CAD; Risk Factors:Dyslipidemia and                 Hypertension.  Sonographer:    Harvie Junior Referring Phys: [id number] [place]  Sonographer Comments: Technically difficult study due to poor echo windows and patient is obese. Image acquisition challenging due to patient body habitus. IMPRESSIONS  1. Left ventricular ejection fraction, by estimation, is 60 to 65%. The left ventricle has normal function. The left ventricle has no regional wall motion abnormalities. Left ventricular diastolic parameters were normal.  2. Right ventricular systolic function is normal. The right ventricular size is normal.  3. The mitral valve is normal in structure. No evidence of mitral valve regurgitation. No evidence of mitral stenosis.  4. The aortic valve is tricuspid. There is moderate calcification of the aortic valve. There is moderate thickening of the aortic valve. Aortic valve regurgitation is not visualized. Aortic valve sclerosis/calcification is present, without any evidence of aortic stenosis.  5.  The inferior vena cava is normal in size with greater than 50% respiratory variability, suggesting right atrial pressure of 3 mmHg. FINDINGS  Left Ventricle: Left ventricular ejection fraction, by estimation, is 60 to 65%. The left ventricle has normal function. The left ventricle has no regional wall motion abnormalities. The left ventricular internal cavity size was normal in size. There is  no left ventricular hypertrophy. Left ventricular diastolic parameters were normal. Right Ventricle: The right ventricular size is normal. No increase in right ventricular wall thickness. Right ventricular systolic function is normal. Left Atrium: Left atrial size was normal in size. Right Atrium: Right atrial size was normal in size. Pericardium: There is no evidence of pericardial effusion. Mitral Valve: The mitral valve is normal in structure. No evidence of mitral valve regurgitation. No evidence of mitral valve stenosis. Tricuspid Valve: The tricuspid valve is normal in structure. Tricuspid valve regurgitation is not demonstrated. No evidence of tricuspid stenosis. Aortic Valve: The aortic valve is tricuspid. There is moderate calcification of the aortic valve. There is moderate thickening of the aortic valve. Aortic valve regurgitation is not visualized. Aortic valve sclerosis/calcification is present, without any  evidence of aortic stenosis. Pulmonic Valve: The pulmonic valve was normal in structure. Pulmonic valve regurgitation is not visualized. No evidence of pulmonic stenosis. Aorta: The aortic root is normal in size and structure. Venous: The inferior vena cava is normal in size with greater than 50% respiratory variability, suggesting right atrial pressure of 3 mmHg. IAS/Shunts: No atrial level shunt detected by color flow Doppler.  LEFT VENTRICLE PLAX 2D LVIDd:         4.90 cm     Diastology LVIDs:         3.20 cm     LV e' medial:    6.74 cm/s LV PW:         1.00 cm     LV E/e' medial:  11.4 LV IVS:        0.90  cm     LV e' lateral:   10.40 cm/s LVOT diam:     2.10 cm     LV E/e' lateral: 7.4 LV SV:         82 LV SV Index:   42 LVOT Area:     3.46 cm  LV Volumes (MOD) LV vol d, MOD A2C: 84.2 ml LV vol d, MOD A4C: 80.7 ml LV vol s, MOD A2C: 31.4 ml LV vol s, MOD A4C: 31.1 ml LV SV MOD A2C:     52.8 ml LV SV MOD A4C:     80.7 ml LV SV MOD BP:      52.1 ml RIGHT VENTRICLE RV Basal diam:  3.20 cm LEFT ATRIUM             Index        RIGHT ATRIUM          Index LA diam:        2.70 cm 1.38 cm/m   RA Area:     8.04 cm LA Vol (A2C):   34.3 ml 17.58 ml/m  RA Volume:   16.40 ml 8.40 ml/m LA Vol (A4C):   25.6 ml 13.12 ml/m LA Biplane Vol: 31.5 ml 16.14 ml/m  AORTIC VALVE             PULMONIC VALVE LVOT Vmax:   116.00 cm/s PV Vmax:       1.04 m/s LVOT Vmean:  85.550 cm/s PV Peak grad:  4.3 mmHg LVOT VTI:    0.236 m  AORTA Ao Root diam: 3.40 cm MITRAL VALVE MV Area (PHT): 3.85 cm    SHUNTS MV Decel Time: 197 msec    Systemic VTI:  0.24 m MV E velocity: 77.10 cm/s  Systemic Diam: 2.10 cm MV A velocity: 67.30 cm/s MV E/A ratio:  1.15 Jenkins Rouge MD Electronically signed by Jenkins Rouge MD Signature Date/Time: 11/14/2022/10:58:36 AM    Final    CARDIAC CATHETERIZATION  Result Date: 11/13/2022   Prox LAD to Mid LAD lesion is 50% stenosed.   Mid Cx to Dist Cx lesion is 10% stenosed.   Prox RCA to Mid RCA lesion is 99% stenosed.   Dist RCA lesion is 90% stenosed.   A drug-eluting stent was successfully placed using a STENT ONYX FRONTIER 2.5X12.   A drug-eluting stent was successfully placed using a SYNERGY XD 3.0X38.   Post intervention, there is a 0% residual stenosis.   Post intervention, there is a 0% residual stenosis.   The left ventricular systolic function is normal.   LV end diastolic pressure is moderately elevated.   The left ventricular ejection fraction is 55-65% by visual estimate. Single vessel obstructive CAD involving the mid and distal RCA. Prior stent in the LCx is widely patent Normal LV function Moderately  elevated LVEDP 26 mm Hg Successful PCI of the mid and distal RCA with DES x 2 Plan: DAPT for at least 12 months. Anticipate DC tomorrow.   DG Chest Portable 1 View  Result Date: 11/12/2022 CLINICAL DATA:  Chest pain EXAM: PORTABLE CHEST 1 VIEW COMPARISON:  10/22/2010 FINDINGS: The heart size and mediastinal contours are within normal limits. Both lungs are clear. The visualized skeletal structures are unremarkable. IMPRESSION: No active disease. Electronically Signed   By: Fidela Salisbury M.D.   On: 11/12/2022 22:55   Disposition   Pt is being discharged home today in good condition.  Follow-up Plans & Appointments     Follow-up Information     [name], PA-C Follow up.   Specialty: Internal Medicine Why: [facility] - [place] location - cardiology follow-up arranged on Wednesday Nov 21, 2022 10:05 AM (Arrive by 9:50 AM). Anderson Malta is one of the PAs that works with Dr. Gwenlyn Found. Contact information: [address] [place] [address] [phone]                Discharge Instructions     Amb Referral to Cardiac Rehabilitation   Complete by: As directed    Diagnosis:  Coronary Stents NSTEMI     After initial evaluation and assessments completed: Virtual Based Care may be provided alone or in conjunction with Phase 2 Cardiac Rehab based on patient barriers.: Yes   Intensive Cardiac Rehabilitation (ICR) [place] location only OR Traditional Cardiac Rehabilitation (TCR) *If criteria for ICR are not met will enroll in TCR [facility] only): Yes   Diet - low sodium heart healthy   Complete by: As directed    Discharge instructions   Complete by: As directed    - Your cholesterol medicine was changed from simvastatin to atorvastatin. - We also sent in refills of your clopidogrel to make sure you do not run out. You will continue this along with aspirin as you have been doing. - We also sent in a prescription for as-needed nitroglycerin. - Restart your lisinopril  tomorrow.   Increase activity slowly   Complete by: As directed    No driving for 1 week. No lifting over 10 lbs for 2 weeks. No sexual activity for 2 weeks. Keep procedure site clean & dry. If you notice increased pain, swelling, bleeding or pus, call/return!  You may shower, but no soaking baths/hot tubs/pools for 1 week.        Discharge Medications   Allergies as of 11/14/2022   No Known Allergies      Medication List     STOP taking these medications    simvastatin 40 MG tablet Commonly known as: ZOCOR       TAKE these medications    aspirin 81 MG tablet Take 81 mg by mouth daily.   atorvastatin 80 MG tablet Commonly known as: LIPITOR Take 1 tablet (80 mg total) by mouth daily. Start taking on: November 15, 2022   B-complex with vitamin C tablet Take 1 tablet by mouth daily.   calcium carbonate 1500 (600 Ca) MG Tabs tablet Commonly known as: OSCAL Take 1 tablet by mouth daily with breakfast.   Carbidopa-Levodopa ER 25-100 MG tablet controlled release Commonly known as: SINEMET CR TAKE 2 TABLETS BY MOUTH AT 7AM, 2 TABLETS AT 11AM AND TAKE 1 TABLET AT 4 PM What changed:  how much to take how to take this when to take this   citalopram 10 MG tablet Commonly known as: CELEXA Take 10 mg by mouth at bedtime.   clopidogrel 75 MG tablet Commonly known as: PLAVIX Take 1 tablet (75 mg total) by mouth daily. What changed: See the new instructions.   levothyroxine 75 MCG tablet Commonly known as: SYNTHROID TAKE 1 TABLET BY MOUTH DAILY BEFORE BREAKFAST   lisinopril 10 MG tablet Commonly known as: ZESTRIL Take 1 tablet (10 mg total) by mouth daily. Notes to patient: You may restart this tomorrow (11/15/22).   LORazepam 0.5 MG tablet Commonly known as: ATIVAN Take 0.5 mg by mouth daily as needed for anxiety.   metoprolol tartrate 25 MG tablet  Commonly known as: LOPRESSOR TAKE 1 TABLET(25 MG) BY MOUTH TWICE DAILY What changed: See the new instructions.    multivitamin tablet Take 1 tablet by mouth daily.   nitroGLYCERIN 0.4 MG SL tablet Commonly known as: NITROSTAT Place 1 tablet (0.4 mg total) under the tongue every 5 (five) minutes as needed for chest pain (up to 3 doses. If taking 3rd dose, call 911).   pramipexole 0.5 MG tablet Commonly known as: MIRAPEX TAKE 1 TABLET(0.5 MG) BY MOUTH THREE TIMES DAILY What changed: See the new instructions.   Zinc 100 MG Tabs Take 100 mg by mouth daily.           Outstanding Labs/Studies   If the patient is tolerating statin at time of follow-up appointment, would consider rechecking liver function/lipid panel in 6-8 weeks.  Duration of Discharge Encounter   Greater than 30 minutes including physician time.  Signed, Charlie Pitter, PA-C 11/14/2022, 12:23 PM  I have personally seen and examined this patient. I agree with the assessment and plan as outlined above.  See my full note this am.   Lauree Chandler, MD, [facility] 11/14/2022 1:06 PM

## 2022-11-14 NOTE — Telephone Encounter (Signed)
   Attention TOC pool,  This patient will need a TOC phone call after discharge. They are being discharged today. Follow-up appointment has already been arranged with: Caron Presume on 1/31 They are a patient of Quay Burow, MD.  Thank you! Charlie Pitter, PA-C

## 2022-11-14 NOTE — Telephone Encounter (Signed)
Patient currently still admitted.   Will call at discharge.  Thanks!

## 2022-11-14 NOTE — Progress Notes (Signed)
  Echocardiogram 2D Echocardiogram has been performed.  Julie Carey 11/14/2022, 10:45 AM

## 2022-11-14 NOTE — Progress Notes (Addendum)
Progress Note  Patient Name: Julie Carey Date of Encounter: 11/14/2022  Primary Cardiologist: Quay Burow, MD  Subjective   Feeling well this AM. No CP or SOB.  Inpatient Medications    Scheduled Meds:  aspirin EC  81 mg Oral Daily   atorvastatin  80 mg Oral Daily   calcium carbonate  1 tablet Oral Q breakfast   Carbidopa-Levodopa ER  1 tablet Oral q1600   Carbidopa-Levodopa ER  2 tablet Oral BID   citalopram  10 mg Oral QHS   clopidogrel  75 mg Oral Daily   heparin  5,000 Units Subcutaneous Q8H   levothyroxine  75 mcg Oral QAC breakfast   lisinopril  10 mg Oral Daily   metoprolol tartrate  12.5 mg Oral BID   multivitamin with minerals  1 tablet Oral Daily   pramipexole  0.5 mg Oral TID   sodium chloride flush  3 mL Intravenous Q12H   sodium chloride flush  3 mL Intravenous Q12H   zinc sulfate  220 mg Oral Daily   Continuous Infusions:  sodium chloride     PRN Meds: sodium chloride, acetaminophen, LORazepam, nitroGLYCERIN, ondansetron (ZOFRAN) IV, sodium chloride flush   Vital Signs    Vitals:   11/13/22 1403 11/13/22 1705 11/13/22 2027 11/14/22 0526  BP: (!) 103/46 (!) 110/58 110/60 111/77  Pulse: 81 83 84 73  Resp:   20 18  Temp: (!) 97.5 F (36.4 C) 98.7 F (37.1 C) 99.1 F (37.3 C) 98 F (36.7 C)  TempSrc: Oral Oral Oral Oral  SpO2: 98% 94% 95% 98%  Weight: 93.9 kg     Height: 5' 2.5" (1.588 m)       Intake/Output Summary (Last 24 hours) at 11/14/2022 0838 Last data filed at 11/14/2022 0814 Gross per 24 hour  Intake 1211.87 ml  Output 530 ml  Net 681.87 ml      11/13/2022    2:03 PM 11/13/2022    1:00 AM 09/25/2022    3:12 PM  Last 3 Weights  Weight (lbs) 207 lb 0.2 oz 210 lb 210 lb 6.4 oz  Weight (kg) 93.9 kg 95.255 kg 95.437 kg     Telemetry    NSR - Personally Reviewed  ECG    NSR 71bpm, nonspecific STTW changes - Personally Reviewed  Physical Exam   GEN: No acute distress.  HEENT: Normocephalic, atraumatic, sclera  non-icteric. Neck: No JVD or bruits. Cardiac: RRR no murmurs, rubs, or gallops.  Respiratory: Clear to auscultation bilaterally. Breathing is unlabored. GI: Soft, nontender, non-distended, BS +x 4. MS: no deformity. Extremities: No clubbing or cyanosis. No edema. Distal pedal pulses are 2+ and equal bilaterally. Right radial cath site without hematoma or ecchymosis; good pulse. Arm tremors noted Neuro:  AAOx3. Follows commands. Psych:  Responds to questions appropriately with a normal affect.  Labs    High Sensitivity Troponin:   Recent Labs  Lab 11/12/22 2155 11/13/22 0026 11/13/22 0800 11/13/22 1450  TROPONINIHS 23* 110* 150* 1,011*      Cardiac EnzymesNo results for input(s): "TROPONINI" in the last 168 hours. No results for input(s): "TROPIPOC" in the last 168 hours.   Chemistry Recent Labs  Lab 11/12/22 2155 11/14/22 0547  NA 132* 136  K 4.0 3.9  CL 100 102  CO2 23 24  GLUCOSE 130* 98  BUN 21 20  CREATININE 0.79 0.88  CALCIUM 9.0 8.9  PROT 6.6  --   ALBUMIN 4.0  --   AST 24  --  ALT 7  --   ALKPHOS 64  --   BILITOT 0.5  --   GFRNONAA >60 >60  ANIONGAP 9 10     Hematology Recent Labs  Lab 11/12/22 2155 11/14/22 0547  WBC 11.3* 7.3  RBC 4.22 4.14  HGB 12.9 12.6  HCT 37.4 37.4  MCV 88.6 90.3  MCH 30.6 30.4  MCHC 34.5 33.7  RDW 12.8 13.2  PLT 321 299    BNPNo results for input(s): "BNP", "PROBNP" in the last 168 hours.   DDimer No results for input(s): "DDIMER" in the last 168 hours.   Radiology    CARDIAC CATHETERIZATION  Result Date: 11/13/2022   Prox LAD to Mid LAD lesion is 50% stenosed.   Mid Cx to Dist Cx lesion is 10% stenosed.   Prox RCA to Mid RCA lesion is 99% stenosed.   Dist RCA lesion is 90% stenosed.   A drug-eluting stent was successfully placed using a STENT ONYX FRONTIER 2.5X12.   A drug-eluting stent was successfully placed using a SYNERGY XD 3.0X38.   Post intervention, there is a 0% residual stenosis.   Post intervention,  there is a 0% residual stenosis.   The left ventricular systolic function is normal.   LV end diastolic pressure is moderately elevated.   The left ventricular ejection fraction is 55-65% by visual estimate. Single vessel obstructive CAD involving the mid and distal RCA. Prior stent in the LCx is widely patent Normal LV function Moderately elevated LVEDP 26 mm Hg Successful PCI of the mid and distal RCA with DES x 2 Plan: DAPT for at least 12 months. Anticipate DC tomorrow.   DG Chest Portable 1 View  Result Date: 11/12/2022 CLINICAL DATA:  Chest pain EXAM: PORTABLE CHEST 1 VIEW COMPARISON:  10/22/2010 FINDINGS: The heart size and mediastinal contours are within normal limits. Both lungs are clear. The visualized skeletal structures are unremarkable. IMPRESSION: No active disease. Electronically Signed   By: Fidela Salisbury M.D.   On: 11/12/2022 22:55    Cardiac Studies   Cath 11/13/22    Prox LAD to Mid LAD lesion is 50% stenosed.   Mid Cx to Dist Cx lesion is 10% stenosed.   Prox RCA to Mid RCA lesion is 99% stenosed.   Dist RCA lesion is 90% stenosed.   A drug-eluting stent was successfully placed using a STENT ONYX FRONTIER 2.5X12.   A drug-eluting stent was successfully placed using a SYNERGY XD 3.0X38.   Post intervention, there is a 0% residual stenosis.   Post intervention, there is a 0% residual stenosis.   The left ventricular systolic function is normal.   LV end diastolic pressure is moderately elevated.   The left ventricular ejection fraction is 55-65% by visual estimate.   Single vessel obstructive CAD involving the mid and distal RCA. Prior stent in the LCx is widely patent Normal LV function Moderately elevated LVEDP 26 mm Hg Successful PCI of the mid and distal RCA with DES x 2   Plan: DAPT for at least 12 months. Anticipate DC tomorrow.   Patient Profile     72 y.o. female with CAD with NSTEMI 2012 s/p DES to AV groove-Cx, HTN, [name], Parkinson's disease, hypothyroidism  presented to [facility] with CP, found to have NSTEMI.  Assessment & Plan    1. NSTEMI/CAD, with [name] - cath as above s/p DES x2 to mid and distal RCA, normal LVEDP, moderately elevated LVEDP 62mHg - continue DAPT with ASA + Plavix (on these PTA),  for at least 12 months - ultimate duration will be at discretion of primary cardiologist beyond 12 months - pending echo - simvastatin changed to high intensity atorvastatin - If the patient is tolerating statin at time of follow-up appointment, would consider rechecking liver function/lipid panel in 6-8 weeks  2. HTN - BP soft at times yesterday but last SBP 142 - will hold lisinopril pending further discussion with MD given elevated LVEDP - currently on metoprolol at lower dose than home dose, will increase back to home dose  3. Parkinson's disease - continue home regimen  5. Hypothyroidism - will add on TSH to labs  Pending echo. Anticipate DC today after. TOC F/u arranged 1 week. Pt wishes to use regular pharmacy.  For questions or updates, please contact [place] Please consult [email or web address] for contact info under Cardiology/STEMI.  Signed, Charlie Pitter, PA-C 11/14/2022, 8:38 AM    I have personally seen and examined this patient. I agree with the assessment and plan as outlined above.  She is doing well today post PCI of the RCA.  Will d/c home on ASA, Plavix and statin.   Lauree Chandler, MD, [facility] 11/14/2022 9:16 AM

## 2022-11-14 NOTE — Progress Notes (Signed)
CARDIAC REHAB PHASE I   PRE:  Rate/Rhythm: 86 SR  BP:  Sitting: 111/77      SaO2: 96 RA  MODE:  Ambulation: 150 ft   POST:  Rate/Rhythm: 110 ST  BP:  Sitting: 142/61      SaO2: 99 RA  Pt ambulated in hall independently, tolerating well with no CP or SOB. Returned to side of bed with call bell and bedside table in reach. Post MI/stent education including site care, risk factors, restrictions, exercise guidelines, MI booklet, heart healthy diet, antiplatelet therapy importance and CRP2 reviewed. All questions and concerns addressed. Will refer to [facility] for CRP2. Plan for possible discharge today.  [phone]  Vanessa Barbara, RN BSN 11/14/2022 8:44 AM

## 2022-11-15 LAB — LIPOPROTEIN A (LPA): Lipoprotein (a): 170.4 nmol/L — ABNORMAL HIGH (ref <75.0)

## 2022-11-16 NOTE — Telephone Encounter (Signed)
Patient contacted regarding discharge from [facility] on 11/14/2022.  Patient understands to follow up with provider Caron Presume, PA-C on 11/21/22 at 10:05 AM at [facility] Patient understands discharge instructions? Yes Patient understands medications and regiment? Yes Patient understands to bring all medications to this visit? Yes

## 2022-11-19 NOTE — Progress Notes (Unsigned)
Cardiology Office Note:    Date:  11/21/2022   ID:  Julie Carey, DOB 06-01-1951, MRN [id number]  PCP:  Carol Ada, [place] Cardiologist: Quay Burow, MD   Reason for visit: NSTEMI follow-up  History of Present Illness:    Julie Carey is a 72 y.o. female with a hx of CAD with NSTEMI 2012 s/p DES to AV groove-Cx, HTN, [name], Parkinson's disease, hypothyroidism.    She was admitted to the hospital January 22 to 24, 2024 with NSTEMI.  She complained of sudden substernal chest pain.  She underwent LHC showing Single vessel obstructive CAD involving the mid and distal RCA. Prior stent in the LCx is widely patent.  Underwent DES x2 to mid and distal RCA.  2D echo showed EF 60-65%, normal RV, aortic sclerosis without stenosis.  Statin changed to high-dose atorvastatin.  Given 1 dose of IV Lasix with LVEDP of 26 mmHg.  Today, patient feels well.  She mentions that she had some chest pain with exertion of 1 month prior to her MI.  It would resolve quickly with rest so she did not seek medical attention.  She states with her first MI in 2012, she had sharp pain in her left breast rating to her back with nausea.  With this NSTEMI, she had central substernal chest pressure.  Chest pain resolved with PCI.  She states since discharge, she has had some queasiness and wonders if it is related to the atorvastatin.  With elevated lipoprotein a, she was switched from simvastatin to atorvastatin.  Otherwise she has done well without shortness of breath, lightheadedness, palpitations, leg swelling, PND and orthopnea.  No bleeding issues with aspirin and Plavix.  She works as an Optometrist.  She is interested in starting cardiac rehab in May after tax season.  She works in [employer].   Past Medical History:  Diagnosis Date   Coronary artery disease    Family history of heart disease    Hyperlipidemia    Hypertension    Hypothyroidism    Parkinson disease     Past Surgical History:   Procedure Laterality Date   CATARACT EXTRACTION, BILATERAL     CORONARY ANGIOPLASTY WITH STENT PLACEMENT  10/24/2010   mid circ.AV groove 80%narrowing, [id number] Promus Element stent , entire region reduced to 0%   CORONARY STENT INTERVENTION N/A 11/13/2022   Procedure: CORONARY STENT INTERVENTION;  Surgeon: JMartinique Peter M, MD;  [facility];  Service: Cardiovascular;  Laterality: N/A;   LEFT HEART CATH AND CORONARY ANGIOGRAPHY N/A 11/13/2022   Procedure: LEFT HEART CATH AND CORONARY ANGIOGRAPHY;  Surgeon: JMartinique Peter M, MD;  [facility];  Service: Cardiovascular;  Laterality: N/A;   NM MYOCAR PERF WALL MOTION  12/27/2010   protocol:[place]ruce, EF 74%,no evidence of ischemia, exercise cap. 7 METS   TRANSTHORACIC ECHOCARDIOGRAM  10/23/2010   Ef 50-55%,all cavity sizes normal, wall thickness normal, no regurg. n    Current Medications: Current Meds  Medication Sig   aspirin 81 MG tablet Take 81 mg by mouth daily.    B Complex-C (B-COMPLEX WITH VITAMIN C) tablet Take 1 tablet by mouth daily.   calcium carbonate (OSCAL) 1500 (600 Ca) MG TABS tablet Take 1 tablet by mouth daily with breakfast.   Carbidopa-Levodopa ER (SINEMET CR) 25-100 MG tablet controlled release TAKE 2 TABLETS BY MOUTH AT 7AM, 2 TABLETS AT 11AM AND TAKE 1 TABLET AT 4 PM (Patient taking differently: Take 1-2 tablets by mouth  See admin instructions. TAKE 2 TABLETS BY MOUTH AT 7AM, 2 TABLETS AT 11AM AND TAKE 1 TABLET AT 4 PM)   citalopram (CELEXA) 10 MG tablet Take 10 mg by mouth at bedtime.   clopidogrel (PLAVIX) 75 MG tablet Take 1 tablet (75 mg total) by mouth daily.   levothyroxine (SYNTHROID, LEVOTHROID) 75 MCG tablet TAKE 1 TABLET BY MOUTH DAILY BEFORE BREAKFAST   lisinopril (PRINIVIL,ZESTRIL) 10 MG tablet Take 1 tablet (10 mg total) by mouth daily.   LORazepam (ATIVAN) 0.5 MG tablet Take 0.5 mg by mouth daily as needed for anxiety.   metoprolol succinate (TOPROL XL) 25 MG 24 hr tablet Take 1  tablet (25 mg total) by mouth daily.   Multiple Vitamin (MULTIVITAMIN) tablet Take 1 tablet by mouth daily.   nitroGLYCERIN (NITROSTAT) 0.4 MG SL tablet Place 1 tablet (0.4 mg total) under the tongue every 5 (five) minutes as needed for chest pain (up to 3 doses. If taking 3rd dose, call 911).   pramipexole (MIRAPEX) 0.5 MG tablet TAKE 1 TABLET(0.5 MG) BY MOUTH THREE TIMES DAILY (Patient taking differently: Take 0.5 mg by mouth 3 (three) times daily.)   rosuvastatin (CRESTOR) 40 MG tablet Take 1 tablet (40 mg total) by mouth daily. With dinner   Zinc 100 MG TABS Take 100 mg by mouth daily.   [DISCONTINUED] atorvastatin (LIPITOR) 80 MG tablet Take 1 tablet (80 mg total) by mouth daily.   [DISCONTINUED] metoprolol tartrate (LOPRESSOR) 25 MG tablet TAKE 1 TABLET(25 MG) BY MOUTH TWICE DAILY (Patient taking differently: Take 25 mg by mouth 2 (two) times daily.)     Allergies:   Patient has no known allergies.   Social History   Socioeconomic History   Marital status: Married    Spouse name: Not on file   Number of children: 3   Years of education: Not on file   Highest education level: Not on file  Occupational History    Comment: taxes and bookkeeping  Tobacco Use   Smoking status: Never   Smokeless tobacco: Never  Vaping Use   Vaping Use: Never used  Substance and Sexual Activity   Alcohol use: Never   Drug use: Never   Sexual activity: Not on file  Other Topics Concern   Not on file  Social History Narrative   Right handed   Lives in a one story home    Social Determinants of Health   Financial Resource Strain: Not on file  Food Insecurity: No Food Insecurity (11/13/2022)   Hunger Vital Sign    Worried About Running Out of Food in the Last Year: Never true    Ran Out of Food in the Last Year: Never true  Transportation Needs: No Transportation Needs (11/13/2022)   PRAPARE - Hydrologist (Medical): No    Lack of Transportation (Non-Medical): No   Physical Activity: Not on file  Stress: Not on file  Social Connections: Not on file     Family History: The patient's family history includes Heart failure in her father; Hydrocephalus in her brother; Hypertension in her mother; Stroke in her mother.  ROS:   Please see the history of present illness.     EKGs/Labs/Other Studies Reviewed:    Recent Labs: 11/12/2022: ALT 7 11/14/2022: BUN 20; Creatinine, Ser 0.88; Hemoglobin 12.6; Platelets 299; Potassium 3.9; Sodium 136   Recent Lipid Panel Lab Results  Component Value Date/Time   CHOL 158 07/17/2018 10:20 AM   TRIG 121 07/17/2018 10:20  AM   HDL 50 07/17/2018 10:20 AM   LDLCALC 84 07/17/2018 10:20 AM    Physical Exam:    VS:  BP 115/70 Comment: right arm  Pulse 75   Ht '5\' 2"'$  (1.575 m)   Wt 206 lb 3.2 oz (93.5 kg)   SpO2 98%   BMI 37.71 kg/m    No data found.       Wt Readings from Last 3 Encounters:  11/21/22 206 lb 3.2 oz (93.5 kg)  11/13/22 207 lb 0.2 oz (93.9 kg)  09/25/22 210 lb 6.4 oz (95.4 kg)     GEN:  Well nourished, well developed in no acute distress HEENT: Normal NECK: No JVD; No carotid bruits CARDIAC: RRR, no murmurs, rubs, gallops RESPIRATORY:  Clear to auscultation without rales, wheezing or rhonchi  ABDOMEN: Soft, non-tender, non-distended MUSCULOSKELETAL: No edema SKIN: Warm and dry; R wrist with mild bruising, strong radial pulse NEUROLOGIC:  Alert and oriented PSYCHIATRIC:  Normal affect     ASSESSMENT AND PLAN   Coronary artery disease, no angina -NSTEMI January 2024 status post DES x 2 to the mid and distal RCA; preserved EF -Continue DAPT for at least 1 year. -With GI upset, recommend switching Lipitor to Crestor 40 mg daily with supper. -Continue beta-blocker and ACE.  Will switch Lopressor to Toprol-XL. -Recommend cardiac rehab.  Until able to start rehab, recommend increasing activity slowly.  Hypertension, well-controlled -Can continue lisinopril.  Potassium and creatinine  normal. -Goal BP is <130/80.  Recommend DASH diet (high in vegetables, fruits, low-fat dairy products, whole grains, poultry, fish, and nuts and low in sweets, sugar-sweetened beverages, and red meats), salt restriction and increase physical activity.  Hyperlipidemia with goal LDL less than 70 -Lipoprotein a was 170 in January 2024.   -Recommend Crestor 40 mg daily as above. -Check fasting lipids in 3 months. -Discussed cholesterol lowering diets - Mediterranean diet, DASH diet, vegetarian diet, low-carbohydrate diet and avoidance of trans fats.  Discussed healthier choice substitutes.  Nuts, high-fiber foods, and fiber supplements may also improve lipids.    Obesity -Discussed how even a 5-10% weight loss can have cardiovascular benefits.   -Recommend moderate intensity activity for 30 minutes 5 days/week and the DASH diet.    Disposition - Follow-up in 4 months with Dr. Gwenlyn Found.   Medication Adjustments/Labs and Tests Ordered: Current medicines are reviewed at length with the patient today.  Concerns regarding medicines are outlined above.  Orders Placed This Encounter  Procedures   Lipid panel   EKG 12-Lead   Meds ordered this encounter  Medications   rosuvastatin (CRESTOR) 40 MG tablet    Sig: Take 1 tablet (40 mg total) by mouth daily. With dinner    Dispense:  90 tablet    Refill:  3   metoprolol succinate (TOPROL XL) 25 MG 24 hr tablet    Sig: Take 1 tablet (25 mg total) by mouth daily.    Dispense:  90 tablet    Refill:  3    There are no Patient Instructions on file for this visit.   Signed, [name], PA-C  11/21/2022 10:33 AM    [place]

## 2022-11-21 ENCOUNTER — Encounter: Payer: Self-pay | Admitting: Physician Assistant

## 2022-11-21 ENCOUNTER — Ambulatory Visit: Payer: PPO | Attending: Physician Assistant | Admitting: Physician Assistant

## 2022-11-21 VITALS — BP 115/70 | HR 75 | Ht 62.0 in | Wt 206.2 lb

## 2022-11-21 DIAGNOSIS — E782 Mixed hyperlipidemia: Secondary | ICD-10-CM

## 2022-11-21 DIAGNOSIS — I2510 Atherosclerotic heart disease of native coronary artery without angina pectoris: Secondary | ICD-10-CM | POA: Diagnosis not present

## 2022-11-21 DIAGNOSIS — I10 Essential (primary) hypertension: Secondary | ICD-10-CM | POA: Diagnosis not present

## 2022-11-21 MED ORDER — ROSUVASTATIN CALCIUM 40 MG PO TABS: 40.0000 mg | ORAL_TABLET | Freq: Every day | ORAL | 3 refills | Status: DC

## 2022-11-21 MED ORDER — METOPROLOL SUCCINATE ER 25 MG PO TB24: 25.0000 mg | ORAL_TABLET | Freq: Every day | ORAL | 3 refills | Status: DC

## 2022-11-21 NOTE — Patient Instructions (Signed)
Medication Instructions:  Stop Lipitor. Stop Lopressor. Start Toprol XL 25 mg ( Take 1 Tablet Daily). Start Crestor 40 mg 9 Take 1 Tablet Daily with Dinner). *If you need a refill on your cardiac medications before your next appointment, please call your pharmacy*   Lab Work: Lipid panel 3 months If you have labs (blood work) drawn today and your tests are completely normal, you will receive your results only by: [place] (if you have MyChart) OR A paper copy in the mail If you have any lab test that is abnormal or we need to change your treatment, we will call you to review the results.   Testing/Procedures: No Testing   Follow-Up: At [facility], you and your health needs are our priority.  As part of our continuing mission to provide you with exceptional heart care, we have created designated Provider Care Teams.  These Care Teams include your primary Cardiologist (physician) and Advanced Practice Providers (APPs -  Physician Assistants and Nurse Practitioners) who all work together to provide you with the care you need, when you need it.  We recommend signing up for the patient portal called "MyChart".  Sign up information is provided on this After Visit Summary.  MyChart is used to connect with patients for Virtual Visits (Telemedicine).  Patients are able to view lab/test results, encounter notes, upcoming appointments, etc.  Non-urgent messages can be sent to your provider as well.   To learn more about what you can do with MyChart, go to [email or web address].    Your next appointment:   4 month(s)  Provider:   Quay Burow, MD     Other Instructions Recommend to starting Cardiac Rehab.

## 2022-12-06 ENCOUNTER — Other Ambulatory Visit: Payer: Self-pay | Admitting: Neurology

## 2022-12-06 DIAGNOSIS — G20A1 Parkinson's disease without dyskinesia, without mention of fluctuations: Secondary | ICD-10-CM

## 2022-12-11 MED ORDER — ATORVASTATIN CALCIUM 40 MG PO TABS: 40.0000 mg | ORAL_TABLET | Freq: Every day | ORAL | 5 refills | Status: DC

## 2023-01-28 ENCOUNTER — Other Ambulatory Visit: Payer: Self-pay | Admitting: Cardiovascular Disease

## 2023-02-05 ENCOUNTER — Other Ambulatory Visit: Payer: Self-pay | Admitting: Neurology

## 2023-02-07 ENCOUNTER — Ambulatory Visit
Admission: RE | Admit: 2023-02-07 | Discharge: 2023-02-07 | Disposition: A | Discharge status: Home / Self Care | Payer: PPO | Source: Ambulatory Visit | Attending: Family Medicine | Admitting: Family Medicine

## 2023-02-07 ENCOUNTER — Other Ambulatory Visit: Payer: Self-pay | Admitting: Family Medicine

## 2023-02-07 DIAGNOSIS — R059 Cough, unspecified: Secondary | ICD-10-CM | POA: Diagnosis not present

## 2023-02-07 DIAGNOSIS — J22 Unspecified acute lower respiratory infection: Secondary | ICD-10-CM | POA: Diagnosis not present

## 2023-02-07 DIAGNOSIS — I5030 Unspecified diastolic (congestive) heart failure: Secondary | ICD-10-CM | POA: Diagnosis not present

## 2023-02-07 DIAGNOSIS — Z03818 Encounter for observation for suspected exposure to other biological agents ruled out: Secondary | ICD-10-CM | POA: Diagnosis not present

## 2023-02-15 DIAGNOSIS — Z1231 Encounter for screening mammogram for malignant neoplasm of breast: Secondary | ICD-10-CM | POA: Diagnosis not present

## 2023-02-20 ENCOUNTER — Telehealth (HOSPITAL_COMMUNITY): Payer: Self-pay

## 2023-02-20 ENCOUNTER — Encounter (HOSPITAL_COMMUNITY): Payer: Self-pay

## 2023-02-20 NOTE — Telephone Encounter (Signed)
Attempted to call patient in regards to Cardiac Rehab - LM on VM Mailed letter 

## 2023-03-05 ENCOUNTER — Other Ambulatory Visit: Payer: Self-pay | Admitting: Neurology

## 2023-03-05 DIAGNOSIS — G20A1 Parkinson's disease without dyskinesia, without mention of fluctuations: Secondary | ICD-10-CM

## 2023-03-08 ENCOUNTER — Other Ambulatory Visit: Payer: Self-pay

## 2023-03-08 DIAGNOSIS — G20A1 Parkinson's disease without dyskinesia, without mention of fluctuations: Secondary | ICD-10-CM

## 2023-03-08 MED ORDER — PRAMIPEXOLE DIHYDROCHLORIDE 0.5 MG PO TABS: ORAL_TABLET | ORAL | 0 refills | Status: DC

## 2023-03-20 ENCOUNTER — Ambulatory Visit: Payer: PPO | Attending: Cardiovascular Disease | Admitting: Cardiovascular Disease

## 2023-03-20 ENCOUNTER — Encounter: Payer: Self-pay | Admitting: Cardiovascular Disease

## 2023-03-20 VITALS — BP 122/64 | HR 79 | Ht 62.0 in | Wt 204.6 lb

## 2023-03-20 DIAGNOSIS — I2510 Atherosclerotic heart disease of native coronary artery without angina pectoris: Secondary | ICD-10-CM

## 2023-03-20 DIAGNOSIS — Z23 Encounter for immunization: Secondary | ICD-10-CM | POA: Diagnosis not present

## 2023-03-20 DIAGNOSIS — I10 Essential (primary) hypertension: Secondary | ICD-10-CM | POA: Diagnosis not present

## 2023-03-20 DIAGNOSIS — F325 Major depressive disorder, single episode, in full remission: Secondary | ICD-10-CM | POA: Diagnosis not present

## 2023-03-20 DIAGNOSIS — F419 Anxiety disorder, unspecified: Secondary | ICD-10-CM | POA: Diagnosis not present

## 2023-03-20 DIAGNOSIS — Z7902 Long term (current) use of antithrombotics/antiplatelets: Secondary | ICD-10-CM | POA: Diagnosis not present

## 2023-03-20 DIAGNOSIS — M1711 Unilateral primary osteoarthritis, right knee: Secondary | ICD-10-CM | POA: Diagnosis not present

## 2023-03-20 DIAGNOSIS — E782 Mixed hyperlipidemia: Secondary | ICD-10-CM

## 2023-03-20 DIAGNOSIS — M1712 Unilateral primary osteoarthritis, left knee: Secondary | ICD-10-CM | POA: Diagnosis not present

## 2023-03-20 DIAGNOSIS — G20A1 Parkinson's disease without dyskinesia, without mention of fluctuations: Secondary | ICD-10-CM | POA: Diagnosis not present

## 2023-03-20 DIAGNOSIS — E785 Hyperlipidemia, unspecified: Secondary | ICD-10-CM | POA: Diagnosis not present

## 2023-03-20 DIAGNOSIS — M170 Bilateral primary osteoarthritis of knee: Secondary | ICD-10-CM | POA: Diagnosis not present

## 2023-03-20 DIAGNOSIS — E039 Hypothyroidism, unspecified: Secondary | ICD-10-CM | POA: Diagnosis not present

## 2023-03-20 NOTE — Assessment & Plan Note (Signed)
History of essential hypertension a blood pressure measured today at 122/64.  She is on lisinopril and metoprolol.

## 2023-03-20 NOTE — Assessment & Plan Note (Signed)
History of hyperlipidemia on statin therapy.  We will recheck a fasting lipid liver profile. 

## 2023-03-20 NOTE — Assessment & Plan Note (Signed)
History of CAD status post non-STEMI and AV groove circumflex stenting by Dr. Tresa Endo 10/24/2010.  She suffered another non-STEMI earlier this year underwent cardiac catheterization by Dr. [place] 11/13/2022 revealing a patent circumflex stent with high-grade mid RCA stenosis.  She then underwent PCI drug-eluting stenting with a 3 mm x 38 mm long Synergy drug-eluting stent excellent angiographic result.  Her LV function was normal.  She is on DAPT with clopidogrel.  She has had no recurrent symptoms.

## 2023-03-20 NOTE — Progress Notes (Signed)
03/20/2023 HARPER SARLES   19-Jun-1951  [id number]  Primary Physician Merri Brunette, MD Primary Cardiologist: Runell Gess MD FACP, [place], [place], [place]  HPI:  Julie Carey is a 72 y.o.    mildly overweight, married Caucasian female, mother of 3, grandmother to 5 grandchildren who I last saw in the office 08/30/2021.  She had a non-STEMI 10/24/2010 and ultimate stenting of her AV groove circumflex by Dr. Daphene Jaeger with a drug-eluting stent. She had moderate but noncritical disease in her LAD and RCA. Her risk factors include hyperlipidemia and family history. She had a negative Myoview December 27, 2010. She complains of labile hypertension and some palpitations; however, her blood pressure log over the last several months, after I increased her lisinopril, showed better control of her blood pressure. She did not increase her beta blocker as I had prescribed. Her lipid profile was excellent for secondary prevention and this will be rechecked.  Her primary care physician began her on Lexapro which improved some of her symptoms probably related to anxiety.    Since I saw her in the office a year and a half ago she did have a recurrent non-STEMI 11/13/2022.  She underwent cardiac catheterization by Dr. Swaziland revealing a patent AV groove circumflex stent with high-grade mid dominant RCA stenosis.  She had PCI and stenting using a 3 mm x 38 mm long Synergy drug-eluting stent with excellent result.  LV function was normal.  She was discharged home on aspirin and clopidogrel.  She has had no recurrent symptoms.   Current Meds  Medication Sig   aspirin 81 MG tablet Take 81 mg by mouth daily.    atorvastatin (LIPITOR) 40 MG tablet Take 1 tablet (40 mg total) by mouth daily.   B Complex-C (B-COMPLEX WITH VITAMIN C) tablet Take 1 tablet by mouth daily.   calcium carbonate (OSCAL) 1500 (600 Ca) MG TABS tablet Take 1 tablet by mouth daily with breakfast.   Carbidopa-Levodopa ER (SINEMET CR) 25-100 MG tablet  controlled release TAKE 2 TABLETS BY MOUTH AT 7AM, 2 TABLETS AT 11AM, AND 1 TABLET BY MOUTH AT 4PM   citalopram (CELEXA) 10 MG tablet Take 10 mg by mouth at bedtime.   clopidogrel (PLAVIX) 75 MG tablet Take 1 tablet (75 mg total) by mouth daily.   levothyroxine (SYNTHROID, LEVOTHROID) 75 MCG tablet TAKE 1 TABLET BY MOUTH DAILY BEFORE BREAKFAST   lisinopril (PRINIVIL,ZESTRIL) 10 MG tablet Take 1 tablet (10 mg total) by mouth daily.   LORazepam (ATIVAN) 0.5 MG tablet Take 0.5 mg by mouth daily as needed for anxiety.   metoprolol tartrate (LOPRESSOR) 25 MG tablet Take 25 mg by mouth 2 (two) times daily.   Multiple Vitamin (MULTIVITAMIN) tablet Take 1 tablet by mouth daily.   nitroGLYCERIN (NITROSTAT) 0.4 MG SL tablet Place 1 tablet (0.4 mg total) under the tongue every 5 (five) minutes as needed for chest pain (up to 3 doses. If taking 3rd dose, call 911).   pramipexole (MIRAPEX) 0.5 MG tablet TAKE 1 TABLET(0.5 MG) BY MOUTH THREE TIMES DAILY   Zinc 100 MG TABS Take 100 mg by mouth daily.     Allergies  Allergen Reactions   Rosuvastatin     Joint pain on 40mg  daily    Social History   Socioeconomic History   Marital status: Married    Spouse name: Not on file   Number of children: 3   Years of education: Not on file   Highest education  level: Not on file  Occupational History    Comment: taxes and bookkeeping  Tobacco Use   Smoking status: Never   Smokeless tobacco: Never  Vaping Use   Vaping Use: Never used  Substance and Sexual Activity   Alcohol use: Never   Drug use: Never   Sexual activity: Not on file  Other Topics Concern   Not on file  Social History Narrative   Right handed   Lives in a one story home    Social Determinants of Health   Financial Resource Strain: Not on file  Food Insecurity: No Food Insecurity (11/13/2022)   Hunger Vital Sign    Worried About Running Out of Food in the Last Year: Never true    Ran Out of Food in the Last Year: Never true   Transportation Needs: No Transportation Needs (11/13/2022)   PRAPARE - Administrator, Civil Service (Medical): No    Lack of Transportation (Non-Medical): No  Physical Activity: Not on file  Stress: Not on file  Social Connections: Not on file  Intimate Partner Violence: Not At Risk (11/13/2022)   Humiliation, Afraid, Rape, and Kick questionnaire    Fear of Current or Ex-Partner: No    Emotionally Abused: No    Physically Abused: No    Sexually Abused: No     Review of Systems: General: negative for chills, fever, night sweats or weight changes.  Cardiovascular: negative for chest pain, dyspnea on exertion, edema, orthopnea, palpitations, paroxysmal nocturnal dyspnea or shortness of breath Dermatological: negative for rash Respiratory: negative for cough or wheezing Urologic: negative for hematuria Abdominal: negative for nausea, vomiting, diarrhea, bright red blood per rectum, melena, or hematemesis Neurologic: negative for visual changes, syncope, or dizziness All other systems reviewed and are otherwise negative except as noted above.    Blood pressure 122/64, pulse 79, height 5\' 2"  (1.575 m), weight 204 lb 9.6 oz (92.8 kg), SpO2 94 %.  General appearance: alert and no distress Neck: no adenopathy, no carotid bruit, no JVD, supple, symmetrical, trachea midline, and thyroid not enlarged, symmetric, no tenderness/mass/nodules Lungs: clear to auscultation bilaterally Heart: regular rate and rhythm, S1, S2 normal, no murmur, click, rub or gallop Extremities: extremities normal, atraumatic, no cyanosis or edema Pulses: 2+ and symmetric Skin: Skin color, texture, turgor normal. No rashes or lesions Neurologic: Grossly normal  EKG not performed today  ASSESSMENT AND PLAN:   Coronary artery disease History of CAD status post non-STEMI and AV groove circumflex stenting by Dr. Tresa Endo 10/24/2010.  She suffered another non-STEMI earlier this year underwent cardiac  catheterization by Dr. Swaziland 11/13/2022 revealing a patent circumflex stent with high-grade mid RCA stenosis.  She then underwent PCI drug-eluting stenting with a 3 mm x 38 mm long Synergy drug-eluting stent excellent angiographic result.  Her LV function was normal.  She is on DAPT with clopidogrel.  She has had no recurrent symptoms.  Hyperlipidemia History of hyperlipidemia on statin therapy.  We will recheck a fasting lipid liver profile.  Essential hypertension History of essential hypertension a blood pressure measured today at 122/64.  She is on lisinopril and metoprolol.     Runell Gess MD FACP,FACC,FAHA, FSCAI 03/20/2023 1:32 PM

## 2023-03-20 NOTE — Patient Instructions (Signed)
Medication Instructions:  Your physician recommends that you continue on your current medications as directed. Please refer to the Current Medication list given to you today.  *If you need a refill on your cardiac medications before your next appointment, please call your pharmacy*   Lab Work: Your physician recommends that you return for lab work in: the next week or 2 for FASTING lipid/liver panel  If you have labs (blood work) drawn today and your tests are completely normal, you will receive your results only by: MyChart Message (if you have MyChart) OR A paper copy in the mail If you have any lab test that is abnormal or we need to change your treatment, we will call you to review the results.   Follow-Up: At [facility], you and your health needs are our priority.  As part of our continuing mission to provide you with exceptional heart care, we have created designated Provider Care Teams.  These Care Teams include your primary Cardiologist (physician) and Advanced Practice Providers (APPs -  Physician Assistants and Nurse Practitioners) who all work together to provide you with the care you need, when you need it.  We recommend signing up for the patient portal called "MyChart".  Sign up information is provided on this After Visit Summary.  MyChart is used to connect with patients for Virtual Visits (Telemedicine).  Patients are able to view lab/test results, encounter notes, upcoming appointments, etc.  Non-urgent messages can be sent to your provider as well.   To learn more about what you can do with MyChart, go to [email or web address].    Your next appointment:   12 month(s)  Provider:   Jonathan Berry, MD    

## 2023-03-28 ENCOUNTER — Ambulatory Visit: Payer: PPO | Admitting: Neurology

## 2023-03-29 DIAGNOSIS — I2510 Atherosclerotic heart disease of native coronary artery without angina pectoris: Secondary | ICD-10-CM | POA: Diagnosis not present

## 2023-03-29 DIAGNOSIS — E782 Mixed hyperlipidemia: Secondary | ICD-10-CM | POA: Diagnosis not present

## 2023-03-29 DIAGNOSIS — I10 Essential (primary) hypertension: Secondary | ICD-10-CM | POA: Diagnosis not present

## 2023-03-30 LAB — LIPID PANEL
Chol/HDL Ratio: 2.1 ratio (ref 0.0–4.4)
Cholesterol, Total: 120 mg/dL (ref 100–199)
HDL: 56 mg/dL (ref >39)
LDL Chol Calc (NIH): 49 mg/dL (ref 0–99)
Triglycerides: 73 mg/dL (ref 0–149)
VLDL Cholesterol Cal: 15 mg/dL (ref 5–40)

## 2023-03-30 LAB — HEPATIC FUNCTION PANEL
ALT: 18 IU/L (ref 0–32)
AST: 20 IU/L (ref 0–40)
Albumin: 4.5 g/dL (ref 3.9–4.9)
Alkaline Phosphatase: 98 IU/L (ref 44–121)
Bilirubin Total: 0.8 mg/dL (ref 0.0–1.2)
Bilirubin, Direct: 0.26 mg/dL (ref 0.00–0.40)
Total Protein: 6.8 g/dL (ref 6.0–8.5)

## 2023-04-11 ENCOUNTER — Telehealth: Payer: Self-pay | Admitting: *Deleted

## 2023-04-11 DIAGNOSIS — M25562 Pain in left knee: Secondary | ICD-10-CM | POA: Diagnosis not present

## 2023-04-11 NOTE — Telephone Encounter (Signed)
   Pre-operative Risk Assessment    Patient Name: Julie Carey  DOB: 02/26/1951 MRN: [id number]      Request for Surgical Clearance    Procedure:   TOTAL KNEE REPLACEMENT   Date of Surgery:  Clearance TBD                                 Surgeon:  DR. Jene Every Surgeon's Group or Practice Name:  [facility] Phone number:  [phone] Fax number:  [phone] ATTN: Rosalva Ferron   Type of Clearance Requested:   - Medical ; ASA    Type of Anesthesia:   CHOICE   Additional requests/questions:    [name]   04/11/2023, 5:58 PM

## 2023-04-12 NOTE — Telephone Encounter (Signed)
Preoperative team, patient underwent cardiac catheterization and stenting on 11/13/2022.  She will not be eligible to hold dual antiplatelet therapy for 12 months from the time of her catheterization.  Early cessation/hold of these medications could compromise her stenting.  Please contact requesting office and let them know that she will need to continue uninterrupted dual antiplatelet therapy.  She will not be eligible to hold aspirin until after 11/14/2023.  Thank you for your help.  Thomasene Ripple. Shakeera Rightmyer NP-C     04/12/2023, 9:53 AM [facility] [address] Office [phone] Fax [phone]

## 2023-04-12 NOTE — Telephone Encounter (Signed)
I will update the requesting office to see notes from Edd Fabian, FNP pre op APP today. We will need a new request to be sent after 11/14/23.

## 2023-04-15 NOTE — Progress Notes (Unsigned)
Assessment/Plan:   1.  Parkinsons Disease  -Continue carbidopa/levodopa 25/100 CR, 2 at 7am, 2 at 11 am, 1 at 4pm  -Continue pramipexole 0.5 mg 3 times per day.   2.  PD dyskinesia  -Better with CR version of levodopa.  Haven't seen dyskinesia since we changed  3.  Hypertension  -On multiple antihypertensives  -Follows with cardiology  -will need to watch this closely with course of time due to risk of Neurogenic Orthostatic Hypotension with Parkinsons Disease   4.  Right foot dystonia  -Was referred to Dr. Wynn Banker for possible Botox.  He recommended she try physical therapy first, but she did not follow back up with them.  Encouraged her to do so if dystonia sx's increase and she agreed, but seems to be stable for now.  5.  Left knee pain  -She is scheduled for total knee on June 05, 2023 but not sure that cardiology is going to clear her given recent MI.   Subjective:   Julie Carey was seen today in follow up for Parkinsons disease.  My previous records were reviewed prior to todays visit as well as outside records available to me.  She is doing well on levodopa and pramipexole.  She has had no sleep attacks.  No compulsive behaviors.  No hallucinations.  She was in the hospital just for 1 night in January with NSTEMI..  She had a stent placed.  It appears that she was scheduled to have a total knee done in August, but I am not sure that cardiology is going to clear her per notes in the system.  Pt states that as far as she knows sx is still on for 06/05/23.    Current prescribed movement disorder medications: Carbidopa/levodopa 25/100 CR, 2/2/1 Pramipexole 0.5 mg 3 times per day.   PREVIOUS MEDICATIONS: carbidopa/levodopa 25/100 IR (changed because of dyskinesia)  ALLERGIES:   Allergies  Allergen Reactions   Rosuvastatin     Joint pain on 40mg  daily    CURRENT MEDICATIONS:  Outpatient Encounter Medications as of 04/16/2023  Medication Sig   aspirin 81 MG tablet  Take 81 mg by mouth daily.    atorvastatin (LIPITOR) 40 MG tablet Take 1 tablet (40 mg total) by mouth daily.   B Complex-C (B-COMPLEX WITH VITAMIN C) tablet Take 1 tablet by mouth daily.   calcium carbonate (OSCAL) 1500 (600 Ca) MG TABS tablet Take 1 tablet by mouth daily with breakfast.   Carbidopa-Levodopa ER (SINEMET CR) 25-100 MG tablet controlled release TAKE 2 TABLETS BY MOUTH AT 7AM, 2 TABLETS AT 11AM, AND 1 TABLET BY MOUTH AT 4PM   citalopram (CELEXA) 10 MG tablet Take 10 mg by mouth at bedtime.   clopidogrel (PLAVIX) 75 MG tablet Take 1 tablet (75 mg total) by mouth daily.   levothyroxine (SYNTHROID, LEVOTHROID) 75 MCG tablet TAKE 1 TABLET BY MOUTH DAILY BEFORE BREAKFAST   lisinopril (PRINIVIL,ZESTRIL) 10 MG tablet Take 1 tablet (10 mg total) by mouth daily.   LORazepam (ATIVAN) 0.5 MG tablet Take 0.5 mg by mouth daily as needed for anxiety.   metoprolol tartrate (LOPRESSOR) 25 MG tablet Take 25 mg by mouth 2 (two) times daily.   Multiple Vitamin (MULTIVITAMIN) tablet Take 1 tablet by mouth daily.   nitroGLYCERIN (NITROSTAT) 0.4 MG SL tablet Place 1 tablet (0.4 mg total) under the tongue every 5 (five) minutes as needed for chest pain (up to 3 doses. If taking 3rd dose, call 911).   pramipexole (MIRAPEX)  0.5 MG tablet TAKE 1 TABLET(0.5 MG) BY MOUTH THREE TIMES DAILY   Zinc 100 MG TABS Take 100 mg by mouth daily.   No facility-administered encounter medications on file as of 04/16/2023.    Objective:   PHYSICAL EXAMINATION:    VITALS:   Vitals:   04/16/23 1431  BP: 122/66  Pulse: 62  SpO2: 96%  Weight: 202 lb 12.8 oz (92 kg)  Height: 5' 2.5" (1.588 m)    GEN:  The patient appears stated age and is in NAD. HEENT:  Normocephalic, atraumatic.  The mucous membranes are moist. The superficial temporal arteries are without ropiness or tenderness. CV:  RRR Lungs:  CTAB Neck/HEME:  There are no carotid bruits bilaterally.  Neurological examination:  Orientation: The patient  is alert and oriented x3. Cranial nerves: There is good facial symmetry with min facial hypomimia. The speech is fluent and clear. Soft palate rises symmetrically and there is no tongue deviation. Hearing is intact to conversational tone. Sensation: Sensation is intact to light touch throughout Motor: Strength is at least antigravity x4.  Movement examination: Tone: There is normal tone in the UE/LE Abnormal movements: there is RUE rest tremor, mod, intermittent (similar to last visit) Coordination:  There is no decremation, with any form of RAMS, including alternating supination and pronation of the forearm, hand opening and closing, finger taps, heel taps and toe taps Gait and Station: The patient has no difficulty arising out of a deep-seated chair without the use of the hands. The patient's stride length is just slightly short stepped.  She has decreased arm swing on the right.  This is similar to last visit.    I have reviewed and interpreted the following labs independently    Chemistry      Component Value Date/Time   NA 136 11/14/2022 0547   K 3.9 11/14/2022 0547   CL 102 11/14/2022 0547   CO2 24 11/14/2022 0547   BUN 20 11/14/2022 0547   CREATININE 0.88 11/14/2022 0547      Component Value Date/Time   CALCIUM 8.9 11/14/2022 0547   ALKPHOS 98 03/29/2023 1115   AST 20 03/29/2023 1115   ALT 18 03/29/2023 1115   BILITOT 0.8 03/29/2023 1115       Lab Results  Component Value Date   WBC 7.3 11/14/2022   HGB 12.6 11/14/2022   HCT 37.4 11/14/2022   MCV 90.3 11/14/2022   PLT 299 11/14/2022    Lab Results  Component Value Date   TSH 2.765 10/23/2010    Cc:  Merri Brunette, MD

## 2023-04-16 ENCOUNTER — Encounter: Payer: Self-pay | Admitting: Neurology

## 2023-04-16 ENCOUNTER — Ambulatory Visit: Payer: PPO | Admitting: Neurology

## 2023-04-16 VITALS — BP 122/66 | HR 62 | Ht 62.5 in | Wt 202.8 lb

## 2023-04-16 DIAGNOSIS — G20A1 Parkinson's disease without dyskinesia, without mention of fluctuations: Secondary | ICD-10-CM | POA: Diagnosis not present

## 2023-04-16 NOTE — Patient Instructions (Signed)
Local and Online Resources for Power over Parkinson's Group  June 2024   LOCAL [place] PARKINSON'S GROUPS   Power over Parkinson's Group:    Power Over Parkinson's Patient Education Group will be Wednesday, JULY 10th-*PLEASE NOTE:  We will not be having a June meeting.  Our next meeting will be in July.  We apologize for the inconvenience.   Power over Parkinson's and Care Partner Groups will meet together, with plans for separate break out session for caregivers, depending on topic/speaker Upcoming Power over Parkinson's Meetings/Care Partner Support:  2nd Wednesdays of the month at 2 pm:   No regular June meeting, July 10th  Contact Amy Marriott at [email or web address] or Sarah Chambers at [email or web address] if interested in participating in this group    LOCAL EVENTS AND NEW OFFERINGS  PLEASE NOTE:  There will be no June Power over Parkinson's meeting (NO MEETING June 12th) Picnic Party for Parkinson's.  [place] [facility] Movement Disorders Program Celebration.  June 19th 1-3 pm.  Contact Sarah Chambers at [email or web address] Parkinson's Social Game Night.  First Thursday of each month, 2:00-4:00 pm.  *Next date is June 6th*.  [facility], [place].  Contact [email or web address] if interested. Parkinson's CarePartner Group for Men is in the works, if interested email [email or web address] ACT [employer] Chair Yoga classes "Train and Gain", Fridays 10 am, [employer].  Contact Gina at [phone].  Community Fitness Instructor-Led Parkinson's Exercises Classes offering at [employer]!  TUESDAYS (Chair Yoga)  and Wednesdays (PWR! Moves)  1:00 pm.   Contact Christy Weaver at  [email or web address]  or [phone]  [place] PWR! Moves classes.  Thursdays at 11:45, [employer].  Free to participate through Parkinson Foundation grant.  Contact Sarah Chambers at [email or web address] or  [phone] to register Drumming for Parkinson's will be held on 2nd and 4th Mondays at 11:00 am.   Located at the [employer] ([address] [place].)  Contact Jane Maydian at [email or web address] or [phone]  [employer] Parkinson's Tai Chi Class, Mondays at 11 am.  Call [phone] for details   ONLINE EDUCATION AND SUPPORT  Parkinson Foundation:  www.parkinson.org  PD Health at Home continues:  Mindfulness Mondays, Wellness Wednesdays, Fitness Fridays  (PWR! Moves as part of Fitness Fridays March 22nd, 1-1:45 pm) Upcoming Education:   Current and Emerging Methods to Aid Parkinson's Diagnosis.  Wednesday, May 29th, 1-2 pm Recognize and Respond to Parkinson's Psychosis.  Wednesday, June 5th, 1-2 pm Parkinsonisms.  Wednesday, June 12th, 1-2 pm Expert Briefing:  Addressing the Challenge of Apathy in Parkinson's.  Wednesday, September 11th, 1-2 pm. Register for virtual education and expert briefings (webinars) at www.parkinson.org/resources-support/online-education Please check out their website to sign up for emails and see their full online offerings     [employer] Foundation:  www.michaeljfox.org   Third Thursday Webinars:  On the third Thursday of every month at 12 p.m. ET, join our free live webinars to learn about various aspects of living with Parkinson's disease and our work to speed medical breakthroughs.  Upcoming Webinar:  You Want to Volunteer for Parkinson's Research: Now What?  Thursday, June 20th at 12 noon. Check out additional information on their website to see their full online offerings    [employer] Phinney Foundation:  www.davisphinneyfoundation.org  Upcoming Webinar:  Living Safely with Parkinson's Inside and Outside of your Home.  Thursday, June 13th , 3 pm Series:  Living with Parkinson's Meetup.   Third Thursdays each month, 3 pm    Care Partner Monthly Meetup.  With Connie Carpenter Phinney.  First Tuesday of each month, 2  pm  Check out additional information to Live Well Today on their website    Parkinson and Movement Disorders (PMD) Alliance:  www.pmdalliance.org  [employer]:  Online Education Events  Sign up for emails, which are sent weekly to give you updates on programming and online offerings    Parkinson's Association of [place]:  www.parkinsonassociation.org  Information on online support groups, education events, and online exercises including Yoga, Parkinson's exercises and more-LOTS of information on links to PD resources and online events  Virtual Support Group through Parkinson's Association of [place]; next one is scheduled for Wednesday, June 5th   MOVEMENT AND EXERCISE OPPORTUNITIES  Parkinson's Exercise Class offerings at [employer]. *TUESDAYS* (Chair yoga) and Wednesdays (PWR! Moves)  1:00 pm.   *Please note that PWR! Moves [place] class has ended.  Please consider trying PWR! Moves on Wednesdays at 1 pm at [employer].  Contact Christy Weaver at [email or web address]    Parkinson's Wellness Recovery (PWR! Moves)  www.pwr4life.org  Info on the PWR! Virtual Experience:  You will have access to our expertise?through self-assessment, guided plans that start with the PD-specific fundamentals, educational content, tips, Q&A with an expert, and a growing library of PD-specific pre-recorded and live exercise classes of varying types and intensity - both physical and cognitive! If that is not enough, we offer 1:1 wellness consultations (in-person or virtual) to personalize your PWR! Virtual Experience.   Parkinson Foundation Fitness Fridays:   As part of the PD Health @ Home program, this free video series focuses each week on one aspect of fitness designed to support people living with Parkinson's.? These weekly videos highlight the Parkinson Foundation fitness guidelines for people with Parkinson's disease.   www.parkinson.org/resources-support/online-education/pdhealth#ff  Dance for PD website is offering free, live-stream classes throughout the week, as well as links to digital library of classes:  https://danceforparkinsons.org/  Virtual dance and Pilates for Parkinson's classes: Click on the Community Tab> Parkinson's Movement Initiative Tab.  To register for classes and for more information, visit www.americandancefestival.org and click the "community" tab.   [employer] Parkinson's Cycling Classes   [employer]:  Thursdays @ Noon-Live classes at [employer] (Contact Margaret Hazen at [email or web address]?or [phone])  [facility]: Classes Tuesday, Wednesday and Thursday (contact Marlee at [email or web address] ?or [phone])  [place] Steady Boxing  Varied levels of classes are offered Tuesdays and Thursdays at [employer] [facility] [facility].   Stretching with Maria weekly class is also offered for people with Parkinson's  To observe a class or for more information, call [phone] or email Hillary Savage at [email or web address]   ADDITIONAL SUPPORT AND RESOURCES  [employer]:  Online Caregiver Education Opportunities:  www.well-springsolutions.org/caregiver-education/caregiver-support-group.  You may also contact Jodi Kolada at [email or web address] or [phone].     [employer] Navigator:  Just1Navigator program, a?free service to help individuals and families through the journey of determining care for older adults.  The "Navigator" is a social worker, Nicole Reynolds, who will speak with a prospective client and/or loved ones to provide an assessment of the situation and a set of recommendations for a personalized care plan -- all free of charge, and whether?[employer] offers the needed service or not. If the need is not a service we provide, we are well-connected with reputable programs in town that we can refer you to.   www.well-springsolutions.org or to speak with the Navigator, call [phone].     

## 2023-04-17 ENCOUNTER — Ambulatory Visit: Payer: Self-pay | Admitting: Orthopedic Surgery

## 2023-05-16 ENCOUNTER — Ambulatory Visit: Payer: Self-pay | Admitting: Orthopedic Surgery

## 2023-05-16 NOTE — H&P (Signed)
MAGDALENA SKILTON is an 72 y.o. female.   Chief Complaint: left knee pain HPI: Patient is here for her H&P. Patient is scheduled for a left total knee replacement by Dr. Shelle Iron on 06/05/23 at [facility].  Dr. Shelle Iron and the patient mutually agreed to proceed with a total knee replacement. Risks and benefits of the procedure were discussed including stiffness, suboptimal range of motion, persistent pain, infection requiring removal of prosthesis and reinsertion, need for prophylactic antibiotics in the future, for example, dental procedures, possible need for manipulation, revision in the future and also anesthetic complications including DVT, PE, etc. We discussed the perioperative course, time in the hospital, postoperative recovery and the need for elevation to control swelling. We also discussed the predicted range of motion and the probability that squatting and kneeling would be unobtainable in the future. In addition, postoperative anticoagulation was discussed. We have obtained preoperative medical clearance as necessary. Provided illustrated handout and discussed it in detail. They will enroll in the total joint replacement educational forum at the hospital.  Past Medical History:  Diagnosis Date   Coronary artery disease    Family history of heart disease    Hyperlipidemia    Hypertension    Hypothyroidism    Parkinson disease     Past Surgical History:  Procedure Laterality Date   CATARACT EXTRACTION, BILATERAL     CORONARY ANGIOPLASTY WITH STENT PLACEMENT  10/24/2010   mid circ.AV groove 80%narrowing, 205X33mm Promus Element stent , entire region reduced to 0%   CORONARY STENT INTERVENTION N/A 11/13/2022   Procedure: CORONARY STENT INTERVENTION;  Surgeon: Swaziland, Peter M, MD;  Location: [facility] INVASIVE CV LAB;  Service: Cardiovascular;  Laterality: N/A;   LEFT HEART CATH AND CORONARY ANGIOGRAPHY N/A 11/13/2022   Procedure: LEFT HEART CATH AND CORONARY ANGIOGRAPHY;  Surgeon: Swaziland,  Peter M, MD;  Location: [facility] INVASIVE CV LAB;  Service: Cardiovascular;  Laterality: N/A;   NM MYOCAR PERF WALL MOTION  12/27/2010   protocol:Bruce, EF 74%,no evidence of ischemia, exercise cap. 7 METS   TRANSTHORACIC ECHOCARDIOGRAM  10/23/2010   Ef 50-55%,all cavity sizes normal, wall thickness normal, no regurg. n    Family History  Problem Relation Age of Onset   Heart failure Father    Hypertension Mother    Stroke Mother    Hydrocephalus Brother    Social History:  reports that she has never smoked. She has never used smokeless tobacco. She reports that she does not drink alcohol and does not use drugs.  Allergies:  Allergies  Allergen Reactions   Rosuvastatin     Joint pain on 40mg  daily   Current meds: aspirin 81 mg tablet,delayed release atorvastatin 80 mg tablet carbidopa 25 mg-levodopa 100 mg tablet citalopram 10 mg tablet clopidogreL 75 mg tablet levothyroxine 75 mcg tablet lisinopriL 10 mg tablet LORazepam 0.5 mg tablet metoprolol tartrate 25 mg tablet nitroglycerin 0.4 mg sublingual tablet pramipexole 0.5 mg tablet  Review of Systems  Constitutional: Negative.   HENT: Negative.    Eyes: Negative.   Respiratory: Negative.    Cardiovascular: Negative.   Gastrointestinal: Negative.   Endocrine: Negative.   Genitourinary: Negative.   Musculoskeletal:  Positive for arthralgias, gait problem and joint swelling.  Skin: Negative.   Psychiatric/Behavioral: Negative.      There were no vitals taken for this visit. Physical Exam Constitutional:      Appearance: Normal appearance.  HENT:     Head: Normocephalic and atraumatic.     Right Ear: External  ear normal.     Left Ear: External ear normal.     Nose: Nose normal.     Mouth/Throat:     Pharynx: Oropharynx is clear.  Eyes:     Conjunctiva/sclera: Conjunctivae normal.  Cardiovascular:     Rate and Rhythm: Normal rate and regular rhythm.     Pulses: Normal pulses.     Heart sounds: Normal heart  sounds.  Pulmonary:     Effort: Pulmonary effort is normal.     Breath sounds: Normal breath sounds.  Abdominal:     General: Bowel sounds are normal.  Musculoskeletal:     Cervical back: Normal range of motion.     Comments: Left knee tender medial joint line patellofemoral pain compression ranges 0-90. No DVT ipsilateral hip and ankle exam is unremarkable.  Skin:    General: Skin is warm and dry.  Neurological:     Mental Status: She is alert.      Assessment/Plan Impression: End-stage left knee osteoarthritis  Plan: Pt with end-stage left knee DJD, bone-on-bone, refractory to conservative tx, scheduled for left total knee replacement by Dr. Shelle Iron on August 14. We again discussed the procedure itself as well as risks, complications and alternatives, including but not limited to DVT, PE, infx, bleeding, failure of procedure, need for secondary procedure including manipulation, nerve injury, ongoing pain/symptoms, anesthesia risk, even stroke or death. Also discussed typical post-op protocols, activity restrictions, need for PT, flexion/extension exercises, time out of work. Discussed need for DVT ppx post-op per protocol. Discussed dental ppx and infx prevention. Also discussed limitations post-operatively such as kneeling and squatting. All questions were answered. Patient desires to proceed with surgery as scheduled.  Will hold supplements, ASA and NSAIDs accordingly. Will remain NPO after midnight the night before surgery. Will present to [facility] for pre-op testing. Anticipate hospital stay to include at least 2 midnights given medical history and to ensure proper pain control. Plan ASA for DVT ppx post-op. Plan oxycodone, tizanidine, Colace, Miralax. Plan home with HHPT post-op with family members at home for assistance. Will follow up 10-14 days post-op for suture removal and xrays.  Plan left total knee replacement  Dorothy Spark, PA-C for Dr Shelle Iron 05/16/2023, 3:03 PM

## 2023-05-21 ENCOUNTER — Other Ambulatory Visit: Payer: Self-pay | Admitting: Neurology

## 2023-05-21 DIAGNOSIS — G20A1 Parkinson's disease without dyskinesia, without mention of fluctuations: Secondary | ICD-10-CM

## 2023-05-21 MED ORDER — CARBIDOPA-LEVODOPA ER 25-100 MG PO TBCR
EXTENDED_RELEASE_TABLET | ORAL | 0 refills | Status: DC
Start: 2023-05-21 — End: 2023-11-18

## 2023-05-22 NOTE — Patient Instructions (Signed)
SURGICAL WAITING ROOM VISITATION Patients having surgery or a procedure may have no more than 2 support people in the waiting area - these visitors may rotate in the visitor waiting room.   Due to an increase in RSV and influenza rates and associated hospitalizations, children ages 60 and under may not visit patients in [facility] hospitals. If the patient needs to stay at the hospital during part of their recovery, the visitor guidelines for inpatient rooms apply.  PRE-OP VISITATION  Pre-op nurse will coordinate an appropriate time for 1 support person to accompany the patient in pre-op.  This support person may not rotate.  This visitor will be contacted when the time is appropriate for the visitor to come back in the pre-op area.  Please refer to the [facility] website for the visitor guidelines for Inpatients (after your surgery is over and you are in a regular room).  You are not required to quarantine at this time prior to your surgery. However, you must do this: Hand Hygiene often Do NOT share personal items Notify your provider if you are in close contact with someone who has COVID or you develop fever 100.4 or greater, new onset of sneezing, cough, sore throat, shortness of breath or body aches.  If you test positive for Covid or have been in contact with anyone that has tested positive in the last 10 days please notify you surgeon.    Your procedure is scheduled on:  Wednesday  June 05, 2023    Report to [facility] Main Entrance: [facility] entrance where the [employer] is available.   Report to admitting at: 06:00    AM  Call this number if you have any questions or problems the morning of surgery [phone]  Do not eat food after Midnight the night prior to your surgery/procedure.  After Midnight you may have the following liquids until    05:30 AM DAY OF SURGERY  Clear Liquid Diet Water Black Coffee (sugar ok, NO MILK/CREAM OR CREAMERS)  Tea (sugar ok, NO  MILK/CREAM OR CREAMERS) regular and decaf                             Plain Jell-O  with no fruit (NO RED)                                           Fruit ices (not with fruit pulp, NO RED)                                     Popsicles (NO RED)                                                                  Juice: NO CITRUS JUICES: only apple, WHITE grape, WHITE cranberry Sports drinks like Gatorade or Powerade (NO RED)                   The day of surgery:  Drink ONE (1) Pre-Surgery Clear Ensure at   0530  AM the morning of surgery. Drink in one sitting. Do not sip.  This drink was given to you during your hospital pre-op appointment visit. Nothing else to drink after completing the Pre-Surgery Clear Ensure  : No candy, chewing gum or throat lozenges.    FOLLOW  ANY ADDITIONAL PRE OP INSTRUCTIONS YOU RECEIVED FROM YOUR SURGEON'S OFFICE!!!   Oral Hygiene is also important to reduce your risk of infection.        Remember - BRUSH YOUR TEETH THE MORNING OF SURGERY WITH YOUR REGULAR TOOTHPASTE  Do NOT smoke after Midnight the night before surgery.  STOP TAKING all Vitamins, Herbs and supplements 1 week before your surgery.   PLAVIX:  Stop ??? ASPIRIN:  ???  Take ONLY these medicines the morning of surgery with A SIP OF WATER:  levothyroxine (Synthroid), metoprolol, carbidopa-Levadopa (Sinemet), Pramipexole (Mirapex).  You may take lorazepam (Ativan) if needed for anxiety.                     You may not have any metal on your body including hair pins, jewelry, and body piercing  Do not wear make-up, lotions, powders, perfumes or deodorant  Do not wear nail polish including gel and S&S, artificial / acrylic nails, or any other type of covering on natural nails including finger and toenails. If you have artificial nails, gel coating, etc., that needs to be removed by a nail salon, Please have this removed prior to surgery. Not doing so may mean that your surgery could be cancelled or  delayed if the Surgeon or anesthesia staff feels like they are unable to monitor you safely.   Do not shave 48 hours prior to surgery to avoid nicks in your skin which may contribute to postoperative infections.    Contacts, Hearing Aids, dentures or bridgework may not be worn into surgery. DENTURES WILL BE REMOVED PRIOR TO SURGERY PLEASE DO NOT APPLY "Poly grip" OR ADHESIVES!!!  You may bring a small overnight bag with you on the day of surgery, only pack items that are not valuable. [facility] IS NOT RESPONSIBLE   FOR VALUABLES THAT ARE LOST OR STOLEN.   Do not bring your home medications to the hospital. The Pharmacy will dispense medications listed on your medication list to you during your admission in the Hospital.  Special Instructions: Bring a copy of your healthcare power of attorney and living will documents the day of surgery, if you wish to have them scanned into your [facility] Medical Records- EPIC  Please read over the following fact sheets you were given: IF YOU HAVE QUESTIONS ABOUT YOUR PRE-OP INSTRUCTIONS, PLEASE CALL [phone].          Pre-operative 5 CHG Bath Instructions   You can play a key role in reducing the risk of infection after surgery. Your skin needs to be as free of germs as possible. You can reduce the number of germs on your skin by washing with CHG (chlorhexidine gluconate) soap before surgery. CHG is an antiseptic soap that kills germs and continues to kill germs even after washing.   DO NOT use if you have an allergy to chlorhexidine/CHG or antibacterial soaps. If your skin becomes reddened or irritated, stop using the CHG and notify one of our RNs at [phone]  Please shower with the CHG soap starting 4 days before surgery using the following schedule: START SHOWERS ON  SATURDAY June 01, 2023                                                                                                                                                                                       Please keep in mind the following:  DO NOT shave, including legs and underarms, starting the day of your first shower.   You may shave your face at any point before/day of surgery.   Place clean sheets on your bed the day you start using CHG soap. Use a clean washcloth (not used since being washed) for each shower. DO NOT sleep with pets once you start using the CHG.   CHG Shower Instructions:  If you choose to wash your hair and private area, wash first with your normal shampoo/soap.  After you use shampoo/soap, rinse your hair and body thoroughly to remove shampoo/soap residue.  Turn the water OFF and apply about 3 tablespoons (45 ml) of CHG soap to a CLEAN washcloth.  Apply CHG soap ONLY FROM YOUR NECK DOWN TO YOUR TOES (washing for 3-5 minutes)  DO NOT use CHG soap on face, private areas, open wounds, or sores.  Pay special attention to the area where your surgery is being performed.  If you are having back surgery, having someone wash your back for you may be helpful.  Wait 2 minutes after CHG soap is applied, then you may rinse off the CHG soap.  Pat dry with a clean towel  Put on clean clothes/pajamas   If you choose to wear lotion, please use ONLY the CHG-compatible lotions on the back of this paper.     Additional instructions for the day of surgery: DO NOT APPLY any lotions, deodorants, cologne, or perfumes.   Put on clean/comfortable clothes.  Brush your teeth.  Ask your nurse before applying any prescription medications to the skin.      CHG Compatible Lotions   Aveeno Moisturizing lotion  Cetaphil Moisturizing Cream  Cetaphil Moisturizing Lotion  Clairol Herbal Essence Moisturizing Lotion, Dry Skin  Clairol Herbal Essence Moisturizing Lotion, Extra Dry Skin  Clairol Herbal Essence Moisturizing Lotion, Normal Skin  Curel Age Defying Therapeutic Moisturizing Lotion with Alpha Hydroxy  Curel Extreme  Care Body Lotion  Curel Soothing Hands Moisturizing Hand Lotion  Curel Therapeutic Moisturizing Cream, Fragrance-Free  Curel Therapeutic Moisturizing Lotion, Fragrance-Free  Curel Therapeutic Moisturizing Lotion, Original Formula  Eucerin Daily Replenishing Lotion  Eucerin Dry Skin Therapy Plus Alpha Hydroxy Crme  Eucerin Dry Skin Therapy Plus Alpha Hydroxy Lotion  Eucerin Original Crme  Eucerin Original Lotion  Eucerin Plus Crme Eucerin Plus Lotion  Eucerin TriLipid Replenishing Lotion  Keri Anti-Bacterial  Hand Lotion  Keri Deep Conditioning Original Lotion Dry Skin Formula Softly Scented  Keri Deep Conditioning Original Lotion, Fragrance Free Sensitive Skin Formula  Keri Lotion Fast Absorbing Fragrance Free Sensitive Skin Formula  Keri Lotion Fast Absorbing Softly Scented Dry Skin Formula  Keri Original Lotion  Keri Skin Renewal Lotion Keri Silky Smooth Lotion  Keri Silky Smooth Sensitive Skin Lotion  Nivea Body Creamy Conditioning Oil  Nivea Body Extra Enriched Lotion  Nivea Body Original Lotion  Nivea Body Sheer Moisturizing Lotion Nivea Crme  Nivea Skin Firming Lotion  NutraDerm 30 Skin Lotion  NutraDerm Skin Lotion  NutraDerm Therapeutic Skin Cream  NutraDerm Therapeutic Skin Lotion  ProShield Protective Hand Cream  Provon moisturizing lotion   FAILURE TO FOLLOW THESE INSTRUCTIONS MAY RESULT IN THE CANCELLATION OF YOUR SURGERY  PATIENT SIGNATURE_________________________________  NURSE SIGNATURE__________________________________  ________________________________________________________________________      Rogelia Mire    An incentive spirometer is a tool that can help keep your lungs clear and active. This tool measures how well you are filling your lungs with each breath. Taking long deep breaths may help reverse or decrease the chance of developing breathing (pulmonary) problems (especially infection) following: A long period of time when you are  unable to move or be active. BEFORE THE PROCEDURE  If the spirometer includes an indicator to show your best effort, your nurse or respiratory therapist will set it to a desired goal. If possible, sit up straight or lean slightly forward. Try not to slouch. Hold the incentive spirometer in an upright position. INSTRUCTIONS FOR USE  Sit on the edge of your bed if possible, or sit up as far as you can in bed or on a chair. Hold the incentive spirometer in an upright position. Breathe out normally. Place the mouthpiece in your mouth and seal your lips tightly around it. Breathe in slowly and as deeply as possible, raising the piston or the ball toward the top of the column. Hold your breath for 3-5 seconds or for as long as possible. Allow the piston or ball to fall to the bottom of the column. Remove the mouthpiece from your mouth and breathe out normally. Rest for a few seconds and repeat Steps 1 through 7 at least 10 times every 1-2 hours when you are awake. Take your time and take a few normal breaths between deep breaths. The spirometer may include an indicator to show your best effort. Use the indicator as a goal to work toward during each repetition. After each set of 10 deep breaths, practice coughing to be sure your lungs are clear. If you have an incision (the cut made at the time of surgery), support your incision when coughing by placing a pillow or rolled up towels firmly against it. Once you are able to get out of bed, walk around indoors and cough well. You may stop using the incentive spirometer when instructed by your caregiver.  RISKS AND COMPLICATIONS Take your time so you do not get dizzy or light-headed. If you are in pain, you may need to take or ask for pain medication before doing incentive spirometry. It is harder to take a deep breath if you are having pain. AFTER USE Rest and breathe slowly and easily. It can be helpful to keep track of a log of your progress. Your  caregiver can provide you with a simple table to help with this. If you are using the spirometer at home, follow these instructions: SEEK MEDICAL CARE IF:  You are  having difficultly using the spirometer. You have trouble using the spirometer as often as instructed. Your pain medication is not giving enough relief while using the spirometer. You develop fever of 100.5 F (38.1 C) or higher.                                                                                                    SEEK IMMEDIATE MEDICAL CARE IF:  You cough up bloody sputum that had not been present before. You develop fever of 102 F (38.9 C) or greater. You develop worsening pain at or near the incision site. MAKE SURE YOU:  Understand these instructions. Will watch your condition. Will get help right away if you are not doing well or get worse. Document Released: 02/18/2007 Document Revised: 12/31/2011 Document Reviewed: 04/21/2007 [facility] Patient Information 2014 [place], [place].

## 2023-05-22 NOTE — Progress Notes (Signed)
COVID Vaccine received:  [x]  No []  Yes Date of any COVID positive Test in last 90 days:  None  PCP - Merri Brunette, MD Cardiologist - Nanetta Batty, MD   Edd Fabian, NP   Chest x-ray - 02-08-2023  2v  EPIC EKG -  11-21-2022  Epic Stress Test - 2012  Epic ECHO - 11-14-2022  Epic Cardiac Cath 11-13-2022  LHC-  DES x 2   by Dr. Swaziland   PCR screen: [x]  Ordered & Completed           []   No Order but Needs PROFEND           []   N/A for this surgery  Surgery Plan:  []  Ambulatory                            [x]  Outpatient in bed                            []  Admit  Anesthesia:    [x]  General  []  Spinal                           []   Choice []   MAC  Pacemaker / ICD device [x]  No []  Yes   Spinal Cord Stimulator:[x]  No []  Yes       History of Sleep Apnea? [x]  No []  Yes   CPAP used?- [x]  No []  Yes    Does the patient monitor blood sugar?          []  No []  Yes  [x]  N/A  Patient has: [x]  NO Hx DM   []  Pre-DM                 []  DM1  []   DM2  Blood Thinner / Instructions:  Plavix Aspirin Instructions:  ASA 81 mg      Edd Fabian, [place] note 04-12-2023: Preoperative team, patient underwent cardiac catheterization and stenting on 11/13/2022. She will not be eligible to hold dual antiplatelet therapy for 12 months from the time of her catheterization. Early cessation/hold of these medications could compromise her stenting. She will not be eligible to hold aspirin until after 11/14/2023.    LMTCB w/ Rosalva Ferron  05-22-2023  Megan (another RN at Dr's office) will contact Andrez Grime re: ASA hold or not  I didn't get a call from Dr. Ermelinda Das office staff during this PST interview. I told Mrs. Phillip that either I or the office staff would be calling her to clarify whether she is able to continue taking her ASA. She voiced understanding of the reasoning behind not holding her ASA.    ERAS Protocol Ordered: []  No  [x]  Yes PRE-SURGERY [x]  ENSURE  []  G2  Patient is to be NPO after: 05:30  am  Comments: Patient was given the 5 CHG shower / bath instructions for TKA surgery along with 2 bottles of the CHG soap. Patient will start this on:  Saturday  June 01, 2023   All questions were asked and answered, Patient voiced understanding of this process.   Activity level: Patient is able climb a flight of stairs without difficulty; []  No CP  but would have some SOB and Leg pain.   Patient can perform ADLs without assistance.   Anesthesia review: CAD- DES x2, Recent NSTEMI 11-13-2022, CHF, HTN,  Parkinson's   Patient denies shortness  of breath, fever, cough and chest pain at PAT appointment.  Patient verbalized understanding and agreement to the Pre-Surgical Instructions that were given to them at this PAT appointment. Patient was also educated of the need to review these PAT instructions again prior to his/her surgery.I reviewed the appropriate phone numbers to call if they have any and questions or concerns.

## 2023-05-23 ENCOUNTER — Encounter (HOSPITAL_COMMUNITY)
Admission: RE | Admit: 2023-05-23 | Discharge: 2023-05-23 | Disposition: A | Discharge status: Home / Self Care | Payer: PPO | Source: Ambulatory Visit | Attending: Specialist | Admitting: Specialist

## 2023-05-23 ENCOUNTER — Other Ambulatory Visit: Payer: Self-pay

## 2023-05-23 ENCOUNTER — Encounter (HOSPITAL_COMMUNITY): Payer: Self-pay | Admitting: *Deleted

## 2023-05-23 VITALS — BP 110/66 | HR 78 | Temp 98.9°F | Resp 20 | Ht 62.25 in | Wt 198.2 lb

## 2023-05-23 DIAGNOSIS — I2510 Atherosclerotic heart disease of native coronary artery without angina pectoris: Secondary | ICD-10-CM | POA: Diagnosis not present

## 2023-05-23 DIAGNOSIS — Z01818 Encounter for other preprocedural examination: Secondary | ICD-10-CM

## 2023-05-23 DIAGNOSIS — Z01812 Encounter for preprocedural laboratory examination: Secondary | ICD-10-CM | POA: Insufficient documentation

## 2023-05-23 HISTORY — DX: Unspecified osteoarthritis, unspecified site: M19.90

## 2023-05-23 HISTORY — DX: Malignant (primary) neoplasm, unspecified: C80.1

## 2023-05-23 HISTORY — DX: Myoneural disorder, unspecified: G70.9

## 2023-05-23 HISTORY — DX: Heart failure, unspecified: I50.9

## 2023-05-23 HISTORY — DX: Acute myocardial infarction, unspecified: I21.9

## 2023-05-23 HISTORY — DX: Anxiety disorder, unspecified: F41.9

## 2023-05-23 LAB — CBC
HCT: 39.3 % (ref 36.0–46.0)
Hemoglobin: 12.9 g/dL (ref 12.0–15.0)
MCH: 30.6 pg (ref 26.0–34.0)
MCHC: 32.8 g/dL (ref 30.0–36.0)
MCV: 93.1 fL (ref 80.0–100.0)
Platelets: 306 10*3/uL (ref 150–400)
RBC: 4.22 MIL/uL (ref 3.87–5.11)
RDW: 13.2 % (ref 11.5–15.5)
WBC: 6.3 10*3/uL (ref 4.0–10.5)
nRBC: 0 % (ref 0.0–0.2)

## 2023-05-23 LAB — SURGICAL PCR SCREEN
MRSA, PCR: NEGATIVE
Staphylococcus aureus: NEGATIVE

## 2023-05-23 LAB — BASIC METABOLIC PANEL
Anion gap: 9 (ref 5–15)
BUN: 20 mg/dL (ref 8–23)
CO2: 24 mmol/L (ref 22–32)
Calcium: 9.3 mg/dL (ref 8.9–10.3)
Chloride: 105 mmol/L (ref 98–111)
Creatinine, Ser: 0.75 mg/dL (ref 0.44–1.00)
GFR, Estimated: >60 mL/min (ref >60)
Glucose, Bld: 116 mg/dL — ABNORMAL HIGH (ref 70–99)
Potassium: 4.1 mmol/L (ref 3.5–5.1)
Sodium: 138 mmol/L (ref 135–145)

## 2023-05-25 ENCOUNTER — Other Ambulatory Visit: Payer: Self-pay | Admitting: Neurology

## 2023-05-25 DIAGNOSIS — G20A1 Parkinson's disease without dyskinesia, without mention of fluctuations: Secondary | ICD-10-CM

## 2023-05-27 ENCOUNTER — Encounter: Payer: Self-pay | Admitting: Cardiovascular Disease

## 2023-06-05 ENCOUNTER — Ambulatory Visit (HOSPITAL_COMMUNITY): Admission: RE | Admit: 2023-06-05 | Payer: PPO | Source: Home / Self Care | Admitting: Specialist

## 2023-06-05 ENCOUNTER — Encounter (HOSPITAL_COMMUNITY): Admission: RE | Payer: Self-pay | Source: Home / Self Care

## 2023-06-05 SURGERY — ARTHROPLASTY, KNEE, TOTAL
Anesthesia: General | Site: Knee | Laterality: Left

## 2023-07-01 ENCOUNTER — Other Ambulatory Visit: Payer: Self-pay | Admitting: Physician Assistant

## 2023-07-17 NOTE — Telephone Encounter (Signed)
Spoke to patient Dr.Berry's advice given.Stated knee pain is unbearable.She will call Dr.Beane.

## 2023-07-22 ENCOUNTER — Telehealth: Payer: Self-pay | Admitting: *Deleted

## 2023-07-22 NOTE — Telephone Encounter (Signed)
Name: Julie Carey  DOB: 10/14/1951  MRN: [id number]  Primary Cardiologist: Nanetta Batty, MD   Preoperative team, please contact this patient and set up a phone call appointment for further preoperative risk assessment. Please obtain consent and complete medication review. Thank you for your help.  I confirm that guidance regarding antiplatelet and oral anticoagulation therapy has been completed and, if necessary, noted below.  Per protocol patient can hold Plavix for 5 days prior to procedure and aspirin 7 days prior to procedure.  Please resume postprocedure when surgically safe and hemostasis is achieved.  I also confirmed the patient resides in the state of [place]. As per [facility] Medical Board telemedicine laws, the patient must reside in the state in which the provider is licensed.   Napoleon Form, Leodis Rains, NP 07/22/2023, 10:56 AM [facility]

## 2023-07-22 NOTE — Telephone Encounter (Signed)
Pt has been scheduled for tele pre op appt 08/02/23 @ 10 am. Med rec and consent are done.

## 2023-07-22 NOTE — Telephone Encounter (Signed)
Pt has been scheduled for tele pre op appt 08/02/23 @ 10 am. Med rec and consent are done.     Patient Consent for Virtual Visit        Julie Carey has provided verbal consent on 07/22/2023 for a virtual visit (video or telephone).   CONSENT FOR VIRTUAL VISIT FOR:  Julie Carey  By participating in this virtual visit I agree to the following:  I hereby voluntarily request, consent and authorize [facility] and its employed or contracted physicians, physician assistants, nurse practitioners or other licensed health care professionals (the Practitioner), to provide me with telemedicine health care services (the "Services") as deemed necessary by the treating Practitioner. I acknowledge and consent to receive the Services by the Practitioner via telemedicine. I understand that the telemedicine visit will involve communicating with the Practitioner through live audiovisual communication technology and the disclosure of certain medical information by electronic transmission. I acknowledge that I have been given the opportunity to request an in-person assessment or other available alternative prior to the telemedicine visit and am voluntarily participating in the telemedicine visit.  I understand that I have the right to withhold or withdraw my consent to the use of telemedicine in the course of my care at any time, without affecting my right to future care or treatment, and that the Practitioner or I may terminate the telemedicine visit at any time. I understand that I have the right to inspect all information obtained and/or recorded in the course of the telemedicine visit and may receive copies of available information for a reasonable fee.  I understand that some of the potential risks of receiving the Services via telemedicine include:  Delay or interruption in medical evaluation due to technological equipment failure or disruption; Information transmitted may not be sufficient (e.g.  poor resolution of images) to allow for appropriate medical decision making by the Practitioner; and/or  In rare instances, security protocols could fail, causing a breach of personal health information.  Furthermore, I acknowledge that it is my responsibility to provide information about my medical history, conditions and care that is complete and accurate to the best of my ability. I acknowledge that Practitioner's advice, recommendations, and/or decision may be based on factors not within their control, such as incomplete or inaccurate data provided by me or distortions of diagnostic images or specimens that may result from electronic transmissions. I understand that the practice of medicine is not an exact science and that Practitioner makes no warranties or guarantees regarding treatment outcomes. I acknowledge that a copy of this consent can be made available to me via my patient portal [facility] MyChart), or I can request a printed copy by calling the office of [facility].    I understand that my insurance will be billed for this visit.   I have read or had this consent read to me. I understand the contents of this consent, which adequately explains the benefits and risks of the Services being provided via telemedicine.  I have been provided ample opportunity to ask questions regarding this consent and the Services and have had my questions answered to my satisfaction. I give my informed consent for the services to be provided through the use of telemedicine in my medical care

## 2023-07-22 NOTE — Telephone Encounter (Signed)
Pre-operative Risk Assessment    Patient Name: Julie Carey  DOB: 1951/03/06 MRN: [id number]      Request for Surgical Clearance    Procedure:   TOTAL KNEE REPLACEMENT  Date of Surgery:  Clearance TBD                                 Surgeon:  DR. Jene Every Surgeon's Group or Practice Name:  [facility] Phone number:  [phone] Fax number:  [phone] ATTN: Rosalva Ferron   Type of Clearance Requested:   - Medical ; ASA AND PLAVIX   Type of Anesthesia:  General    Additional requests/questions:    [name]   07/22/2023, 10:32 AM

## 2023-08-02 ENCOUNTER — Ambulatory Visit: Payer: PPO | Attending: Cardiology | Admitting: Nurse Practitioner

## 2023-08-02 DIAGNOSIS — Z01810 Encounter for preprocedural cardiovascular examination: Secondary | ICD-10-CM

## 2023-08-02 NOTE — Progress Notes (Signed)
Virtual Visit via Telephone Note   Because of Julie Carey's co-morbid illnesses, she is at least at moderate risk for complications without adequate follow up.  This format is felt to be most appropriate for this patient at this time.  The patient did not have access to video technology/had technical difficulties with video requiring transitioning to audio format only (telephone).  All issues noted in this document were discussed and addressed.  No physical exam could be performed with this format.  Please refer to the patient's chart for her consent to telehealth for [facility].  Evaluation Performed:  Preoperative cardiovascular risk assessment _____________   Date:  08/02/2023   Patient ID:  Julie Carey, DOB 01-Oct-1951, MRN [id number] Patient Location:  Home Provider location:   Office  Primary Care Provider:  Merri Brunette, MD Primary Cardiologist:  Nanetta Batty, MD  Chief Complaint / Patient Profile   72 y.o. y/o female with a h/o CAD s/p stenting-LCx in 2012, DES-RCA in 10/2022, hypertension, and hyperlipidemia who is pending total knee replacement with Dr. Jene Every of [facility] and presents today for telephonic preoperative cardiovascular risk assessment.  History of Present Illness    Julie Carey is a 72 y.o. female who presents via audio/video conferencing for a telehealth visit today.  Pt was last seen in cardiology clinic on 03/20/2023 by Dr. Allyson Sabal.  At that time Julie Carey was doing well.  The patient is now pending procedure as outlined above. Since her last visit, she has done well from a cardiac standpoint.   She denies chest pain, palpitations, dyspnea, pnd, orthopnea, n, v, dizziness, syncope, edema, weight gain, or early satiety. All other systems reviewed and are otherwise negative except as noted above.   Past Medical History    Past Medical History:  Diagnosis Date   Anxiety    Arthritis    Cancer (HCC)    Melanoma on back of  left leg   CHF (congestive heart failure) (HCC)    Coronary artery disease    Family history of heart disease    Hyperlipidemia    Hypertension    Hypothyroidism    Myocardial infarction (HCC)    Neuromuscular disorder (HCC)    Parkinson's tremors etc   Parkinson disease    Past Surgical History:  Procedure Laterality Date   ABDOMINAL HYSTERECTOMY     CATARACT EXTRACTION, BILATERAL     CORONARY ANGIOPLASTY WITH STENT PLACEMENT  10/24/2010   mid circ.AV groove 80%narrowing, 205X53mm Promus Element stent , entire region reduced to 0%   CORONARY STENT INTERVENTION N/A 11/13/2022   Procedure: CORONARY STENT INTERVENTION;  Surgeon: Swaziland, Peter M, MD;  Location: [facility] INVASIVE CV LAB;  Service: Cardiovascular;  Laterality: N/A;   KNEE ARTHROSCOPY W/ DEBRIDEMENT Right    LEFT HEART CATH AND CORONARY ANGIOGRAPHY N/A 11/13/2022   Procedure: LEFT HEART CATH AND CORONARY ANGIOGRAPHY;  Surgeon: Swaziland, Peter M, MD;  Location: [facility] INVASIVE CV LAB;  Service: Cardiovascular;  Laterality: N/A;   NM MYOCAR PERF WALL MOTION  12/27/2010   protocol:Bruce, EF 74%,no evidence of ischemia, exercise cap. 7 METS   TRANSTHORACIC ECHOCARDIOGRAM  10/23/2010   Ef 50-55%,all cavity sizes normal, wall thickness normal, no regurg. n    Allergies  Allergies  Allergen Reactions   Rosuvastatin     Joint pain on 40mg  daily    Home Medications    Prior to Admission medications   Medication Sig Start Date End Date Taking? Authorizing Provider  aspirin 81 MG tablet Take 81 mg by mouth daily.     [provider]  atorvastatin (LIPITOR) 80 MG tablet TAKE 1 TABLET(80 MG) BY MOUTH DAILY 07/02/23   Runell Gess, MD  B Complex-C (B-COMPLEX WITH VITAMIN C) tablet Take 1 tablet by mouth daily.    [provider]  calcium carbonate (OSCAL) 1500 (600 Ca) MG TABS tablet Take 1 tablet by mouth daily with breakfast.    [provider]  Carbidopa-Levodopa ER (SINEMET CR) 25-100 MG tablet  controlled release TAKE 2 TABLETS BY MOUTH AT 7AM, 2 TABLETS AT 11AM, AND 1 TABLET BY MOUTH AT 4PM 05/21/23   Tat, Rebecca S, DO  citalopram (CELEXA) 10 MG tablet Take 10 mg by mouth at bedtime.    [provider]  clopidogrel (PLAVIX) 75 MG tablet Take 1 tablet (75 mg total) by mouth daily. 11/14/22   Dunn, Tacey Ruiz, PA-C  levothyroxine (SYNTHROID, LEVOTHROID) 75 MCG tablet TAKE 1 TABLET BY MOUTH DAILY BEFORE BREAKFAST 08/08/17   Runell Gess, MD  lisinopril (PRINIVIL,ZESTRIL) 10 MG tablet Take 1 tablet (10 mg total) by mouth daily. 07/26/16   Runell Gess, MD  LORazepam (ATIVAN) 0.5 MG tablet Take 0.5 mg by mouth daily as needed for anxiety. 06/28/20   [provider]  metoprolol tartrate (LOPRESSOR) 25 MG tablet Take 25 mg by mouth 2 (two) times daily. 02/22/23   [provider]  Multiple Vitamin (MULTIVITAMIN) tablet Take 1 tablet by mouth daily.    [provider]  nitroGLYCERIN (NITROSTAT) 0.4 MG SL tablet Place 1 tablet (0.4 mg total) under the tongue every 5 (five) minutes as needed for chest pain (up to 3 doses. If taking 3rd dose, call 911). Patient not taking: Reported on 07/22/2023 11/14/22   Laurann Montana, PA-C  pramipexole (MIRAPEX) 0.5 MG tablet TAKE 1 TABLET(0.5 MG) BY MOUTH THREE TIMES DAILY 05/27/23   Tat, Octaviano Batty, DO  Zinc 100 MG TABS Take 100 mg by mouth daily. Patient not taking: Reported on 07/22/2023    [provider]    Physical Exam    Vital Signs:  Julie Carey does not have vital signs available for review today.  Given telephonic nature of communication, physical exam is limited. AAOx3. NAD. Normal affect.  Speech and respirations are unlabored.  Accessory Clinical Findings    None  Assessment & Plan    1.  Preoperative Cardiovascular Risk Assessment:  According to the Revised Cardiac Risk Index (RCRI), her Perioperative Risk of Major Cardiac Event is (%): 0.9. Her Functional Capacity in METs is: 5.07 according  to the Duke Activity Status Index (DASI). Therefore, based on ACC/AHA guidelines, patient would be at acceptable risk for the planned procedure without further cardiovascular testing.   The patient was advised that if she develops new symptoms prior to surgery to contact our office to arrange for a follow-up visit, and she verbalized understanding.  Pt is 10 months post PCI. Per Dr. Allyson Sabal, she may hold Plavix for 5 days prior to surgery. Please resume Plavix as soon as possible postprocedure, at the discretion of the surgeon. Aspirin should be continued throughout there perioperative period given risk of in-stent restenosis.    A copy of this note will be routed to requesting surgeon.  Time:   Today, I have spent 6 minutes with the patient with telehealth technology discussing medical history, symptoms, and management plan.     Joylene Grapes, NP  08/02/2023, 10:09 AM

## 2023-08-08 ENCOUNTER — Encounter: Payer: Self-pay | Admitting: Cardiovascular Disease

## 2023-08-15 NOTE — Telephone Encounter (Signed)
I s/w the surgery scheduler Cordelia Pen and she confirms clearance notes received. I will let the pt know surgeon has the notes.   Pt aware surgeon office has notes.

## 2023-08-15 NOTE — Telephone Encounter (Signed)
Called [facility] in regards to pts clearance. They stated that clearance has not yet been received. I assured them that we have faxed clearance over multiple times and verified fax number listed on clearance. I was given a different fax number [phone] to use. Clearance has been refaxed and I have asked requesting office to give [place] a call once clearance is received so that I can reach back out to patient.

## 2023-08-15 NOTE — Telephone Encounter (Signed)
Patient calling in for clearance to be sent over to the dr office. Please advise

## 2023-08-15 NOTE — Telephone Encounter (Signed)
I also sent a secure chat to the surgery scheduler Rosalva Ferron for Dr. Shelle Iron informing that our office has re-faxed clearance notes again from Bernadene Person, NP.

## 2023-08-16 ENCOUNTER — Other Ambulatory Visit: Payer: Self-pay | Admitting: Neurology

## 2023-08-16 DIAGNOSIS — G20A1 Parkinson's disease without dyskinesia, without mention of fluctuations: Secondary | ICD-10-CM

## 2023-08-19 ENCOUNTER — Ambulatory Visit: Payer: Self-pay | Admitting: Orthopedic Surgery

## 2023-09-17 NOTE — Patient Instructions (Signed)
SURGICAL WAITING ROOM VISITATION  Patients having surgery or a procedure may have no more than 2 support people in the waiting area - these visitors may rotate.    Children under the age of 75 must have an adult with them who is not the patient.  Due to an increase in RSV and influenza rates and associated hospitalizations, children ages 18 and under may not visit patients in [facility] hospitals.  If the patient needs to stay at the hospital during part of their recovery, the visitor guidelines for inpatient rooms apply. Pre-op nurse will coordinate an appropriate time for 1 support person to accompany patient in pre-op.  This support person may not rotate.    Please refer to the [facility] website for the visitor guidelines for Inpatients (after your surgery is over and you are in a regular room).       Your procedure is scheduled on:  10/02/2023    Report to [facility] Main Entrance    Report to admitting at  0600 AM   Call this number if you have problems the morning of surgery [phone]   Do not eat food :After Midnight.   After Midnight you may have the following liquids until __ 0530____ AM DAY OF SURGERY  Water Non-Citrus Juices (without pulp, NO RED-Apple, White grape, White cranberry) Black Coffee (NO MILK/CREAM OR CREAMERS, sugar ok)  Clear Tea (NO MILK/CREAM OR CREAMERS, sugar ok) regular and decaf                             Plain Jell-O (NO RED)                                           Fruit ices (not with fruit pulp, NO RED)                                     Popsicles (NO RED)                                                               Sports drinks like Gatorade (NO RED)            The day of surgery:  Drink ONE (1) Pre-Surgery Clear Ensure or G2 at  0530AM ( have completed by )  the morning of surgery. Drink in one sitting. Do not sip.  This drink was given to you during your hospital  pre-op appointment visit. Nothing else to drink after  completing the  Pre-Surgery Clear Ensure or G2.          If you have questions, please contact your surgeon's office.      Oral Hygiene is also important to reduce your risk of infection.                                    Remember - BRUSH YOUR TEETH THE MORNING OF SURGERY WITH YOUR REGULAR TOOTHPASTE  DENTURES WILL BE REMOVED PRIOR TO  SURGERY PLEASE DO NOT APPLY "Poly grip" OR ADHESIVES!!!   Do NOT smoke after Midnight   Stop all vitamins and herbal supplements 7 days before surgery.   Take these medicines the morning of surgery with A SIP OF WATER:  sinemet, synthroid, metoprolol   DO NOT TAKE ANY ORAL DIABETIC MEDICATIONS DAY OF YOUR SURGERY  Bring CPAP mask and tubing day of surgery.                              You may not have any metal on your body including hair pins, jewelry, and body piercing             Do not wear make-up, lotions, powders, perfumes/cologne, or deodorant  Do not wear nail polish including gel and S&S, artificial/acrylic nails, or any other type of covering on natural nails including finger and toenails. If you have artificial nails, gel coating, etc. that needs to be removed by a nail salon please have this removed prior to surgery or surgery may need to be canceled/ delayed if the surgeon/ anesthesia feels like they are unable to be safely monitored.   Do not shave  48 hours prior to surgery.               Men may shave face and neck.   Do not bring valuables to the hospital. [facility] IS NOT             RESPONSIBLE   FOR VALUABLES.   Contacts, glasses, dentures or bridgework may not be worn into surgery.   Bring small overnight bag day of surgery.   DO NOT BRING YOUR HOME MEDICATIONS TO THE HOSPITAL. PHARMACY WILL DISPENSE MEDICATIONS LISTED ON YOUR MEDICATION LIST TO YOU DURING YOUR ADMISSION IN THE HOSPITAL!    Patients discharged on the day of surgery will not be allowed to drive home.  Someone NEEDS to stay with you for the first 24 hours  after anesthesia.   Special Instructions: Bring a copy of your healthcare power of attorney and living will documents the day of surgery if you haven't scanned them before.              Please read over the following fact sheets you were given: IF YOU HAVE QUESTIONS ABOUT YOUR PRE-OP INSTRUCTIONS PLEASE CALL [phone]   If you received a COVID test during your pre-op visit  it is requested that you wear a mask when out in public, stay away from anyone that may not be feeling well and notify your surgeon if you develop symptoms. If you test positive for Covid or have been in contact with anyone that has tested positive in the last 10 days please notify you surgeon.      Pre-operative 5 CHG Bath Instructions   You can play a key role in reducing the risk of infection after surgery. Your skin needs to be as free of germs as possible. You can reduce the number of germs on your skin by washing with CHG (chlorhexidine gluconate) soap before surgery. CHG is an antiseptic soap that kills germs and continues to kill germs even after washing.   DO NOT use if you have an allergy to chlorhexidine/CHG or antibacterial soaps. If your skin becomes reddened or irritated, stop using the CHG and notify one of our RNs at [phone].   Please shower with the CHG soap starting 4 days before surgery using the following schedule:  Please keep in mind the following:  DO NOT shave, including legs and underarms, starting the day of your first shower.   You may shave your face at any point before/day of surgery.  Place clean sheets on your bed the day you start using CHG soap. Use a clean washcloth (not used since being washed) for each shower. DO NOT sleep with pets once you start using the CHG.   CHG Shower Instructions:  If you choose to wash your hair and private area, wash first with your normal shampoo/soap.  After you use shampoo/soap, rinse your hair and body thoroughly to remove shampoo/soap  residue.  Turn the water OFF and apply about 3 tablespoons (45 ml) of CHG soap to a CLEAN washcloth.  Apply CHG soap ONLY FROM YOUR NECK DOWN TO YOUR TOES (washing for 3-5 minutes)  DO NOT use CHG soap on face, private areas, open wounds, or sores.  Pay special attention to the area where your surgery is being performed.  If you are having back surgery, having someone wash your back for you may be helpful. Wait 2 minutes after CHG soap is applied, then you may rinse off the CHG soap.  Pat dry with a clean towel  Put on clean clothes/pajamas   If you choose to wear lotion, please use ONLY the CHG-compatible lotions on the back of this paper.     Additional instructions for the day of surgery: DO NOT APPLY any lotions, deodorants, cologne, or perfumes.   Put on clean/comfortable clothes.  Brush your teeth.  Ask your nurse before applying any prescription medications to the skin.      CHG Compatible Lotions   Aveeno Moisturizing lotion  Cetaphil Moisturizing Cream  Cetaphil Moisturizing Lotion  Clairol Herbal Essence Moisturizing Lotion, Dry Skin  Clairol Herbal Essence Moisturizing Lotion, Extra Dry Skin  Clairol Herbal Essence Moisturizing Lotion, Normal Skin  Curel Age Defying Therapeutic Moisturizing Lotion with Alpha Hydroxy  Curel Extreme Care Body Lotion  Curel Soothing Hands Moisturizing Hand Lotion  Curel Therapeutic Moisturizing Cream, Fragrance-Free  Curel Therapeutic Moisturizing Lotion, Fragrance-Free  Curel Therapeutic Moisturizing Lotion, Original Formula  Eucerin Daily Replenishing Lotion  Eucerin Dry Skin Therapy Plus Alpha Hydroxy Crme  Eucerin Dry Skin Therapy Plus Alpha Hydroxy Lotion  Eucerin Original Crme  Eucerin Original Lotion  Eucerin Plus Crme Eucerin Plus Lotion  Eucerin TriLipid Replenishing Lotion  Keri Anti-Bacterial Hand Lotion  Keri Deep Conditioning Original Lotion Dry Skin Formula Softly Scented  Keri Deep Conditioning Original Lotion,  Fragrance Free Sensitive Skin Formula  Keri Lotion Fast Absorbing Fragrance Free Sensitive Skin Formula  Keri Lotion Fast Absorbing Softly Scented Dry Skin Formula  Keri Original Lotion  Keri Skin Renewal Lotion Keri Silky Smooth Lotion  Keri Silky Smooth Sensitive Skin Lotion  Nivea Body Creamy Conditioning Oil  Nivea Body Extra Enriched Teacher, adult education Moisturizing Lotion Nivea Crme  Nivea Skin Firming Lotion  NutraDerm 30 Skin Lotion  NutraDerm Skin Lotion  NutraDerm Therapeutic Skin Cream  NutraDerm Therapeutic Skin Lotion  ProShield Protective Hand Cream  Provon moisturizing lotion

## 2023-09-17 NOTE — Progress Notes (Addendum)
Anesthesia Review:  PCP: Merri Brunette - LOV 09/12/22  Cardiologist : Ledell Peoples MOnge,NP preop eval on 08/02/23  DR Jeri Cos- LOV 03/20/23  Neuro- Lurena Joiner Tat- LOV 04/16/23  Chest x-ray : 02/08/23- 2 v  EKG : 11/21/22  Echo : 11/14/22  Stress test: 2017  Cardiac Cath :  11/13/22  Activity level: can do a flight of stairs without difficulty  Sleep Study/ CPAP : none  Fasting Blood Sugar :      / Checks Blood Sugar -- times a day:   Blood Thinner/ Instructions /Last Dose: ASA / Instructions/ Last Dose :    81 mg aspirin- per pt cardiology states one thing and DR Shelle Iron states another - instructed pt at preop to call and to confirm what she is to do with asa prior to surgery .  PT voiced understanding.   Plavix - stop 5 days prior per pt    Parkinson's disease  8/24- surgery cancelled - - per pt cardiology wanted her to wait since recent MI 10/2022.

## 2023-09-24 ENCOUNTER — Encounter (HOSPITAL_COMMUNITY): Payer: Self-pay

## 2023-09-24 ENCOUNTER — Other Ambulatory Visit: Payer: Self-pay

## 2023-09-24 ENCOUNTER — Encounter (HOSPITAL_COMMUNITY)
Admission: RE | Admit: 2023-09-24 | Discharge: 2023-09-24 | Disposition: A | Discharge status: Home / Self Care | Payer: PPO | Source: Ambulatory Visit | Attending: Specialist | Admitting: Specialist

## 2023-09-24 VITALS — BP 123/64 | HR 63 | Temp 98.7°F | Resp 16 | Ht 61.75 in | Wt 201.0 lb

## 2023-09-24 DIAGNOSIS — Z955 Presence of coronary angioplasty implant and graft: Secondary | ICD-10-CM | POA: Insufficient documentation

## 2023-09-24 DIAGNOSIS — I2510 Atherosclerotic heart disease of native coronary artery without angina pectoris: Secondary | ICD-10-CM | POA: Insufficient documentation

## 2023-09-24 DIAGNOSIS — G20A1 Parkinson's disease without dyskinesia, without mention of fluctuations: Secondary | ICD-10-CM | POA: Diagnosis not present

## 2023-09-24 DIAGNOSIS — Z7902 Long term (current) use of antithrombotics/antiplatelets: Secondary | ICD-10-CM | POA: Insufficient documentation

## 2023-09-24 DIAGNOSIS — I10 Essential (primary) hypertension: Secondary | ICD-10-CM | POA: Insufficient documentation

## 2023-09-24 DIAGNOSIS — Z7982 Long term (current) use of aspirin: Secondary | ICD-10-CM | POA: Insufficient documentation

## 2023-09-24 DIAGNOSIS — Z01818 Encounter for other preprocedural examination: Secondary | ICD-10-CM

## 2023-09-24 DIAGNOSIS — Z01812 Encounter for preprocedural laboratory examination: Secondary | ICD-10-CM | POA: Diagnosis not present

## 2023-09-24 DIAGNOSIS — M1712 Unilateral primary osteoarthritis, left knee: Secondary | ICD-10-CM | POA: Diagnosis not present

## 2023-09-24 HISTORY — DX: Other specified postprocedural states: R11.2

## 2023-09-24 HISTORY — DX: Nausea with vomiting, unspecified: Z98.890

## 2023-09-24 LAB — CBC
HCT: 39 % (ref 36.0–46.0)
Hemoglobin: 13.5 g/dL (ref 12.0–15.0)
MCH: 32.1 pg (ref 26.0–34.0)
MCHC: 34.6 g/dL (ref 30.0–36.0)
MCV: 92.6 fL (ref 80.0–100.0)
Platelets: 316 10*3/uL (ref 150–400)
RBC: 4.21 MIL/uL (ref 3.87–5.11)
RDW: 12.9 % (ref 11.5–15.5)
WBC: 6.4 10*3/uL (ref 4.0–10.5)
nRBC: 0 % (ref 0.0–0.2)

## 2023-09-24 LAB — BASIC METABOLIC PANEL
Anion gap: 4 — ABNORMAL LOW (ref 5–15)
BUN: 24 mg/dL — ABNORMAL HIGH (ref 8–23)
CO2: 28 mmol/L (ref 22–32)
Calcium: 9.5 mg/dL (ref 8.9–10.3)
Chloride: 103 mmol/L (ref 98–111)
Creatinine, Ser: 0.88 mg/dL (ref 0.44–1.00)
GFR, Estimated: >60 mL/min (ref >60)
Glucose, Bld: 85 mg/dL (ref 70–99)
Potassium: 4.6 mmol/L (ref 3.5–5.1)
Sodium: 135 mmol/L (ref 135–145)

## 2023-09-24 LAB — SURGICAL PCR SCREEN
MRSA, PCR: NEGATIVE
Staphylococcus aureus: NEGATIVE

## 2023-09-25 NOTE — Progress Notes (Signed)
Anesthesia Chart Review   Case: [id number] Date/Time: 10/02/23 0815   Procedure: TOTAL KNEE ARTHROPLASTY (Left: Knee)   Anesthesia type: General   Pre-op diagnosis: left knee osteoarthritis   Location: [facility] ROOM 10 / [facility]   Surgeons: Jene Every, MD       DISCUSSION:72 y.o. never smoker with h/o PONV, HTN, CAD s/p stenting-LCx in 2012, DES-RCA in 10/2022, Parkinson's, left knee OA scheduled for above procedure 10/02/23 with Dr. Jene Every.   Per cardiology preoperative evaluation 08/02/2023, "According to the Revised Cardiac Risk Index (RCRI), her Perioperative Risk of Major Cardiac Event is (%): 0.9. Her Functional Capacity in METs is: 5.07 according to the Duke Activity Status Index (DASI). Therefore, based on ACC/AHA guidelines, patient would be at acceptable risk for the planned procedure without further cardiovascular testing.    The patient was advised that if she develops new symptoms prior to surgery to contact our office to arrange for a follow-up visit, and she verbalized understanding.   Pt is 10 months post PCI. Per Dr. Allyson Sabal, she may hold Plavix for 5 days prior to surgery. Please resume Plavix as soon as possible postprocedure, at the discretion of the surgeon. Aspirin should be continued throughout there perioperative period given risk of in-stent restenosis."  VS: BP 123/64   Pulse 63   Temp 37.1 C (Oral)   Resp 16   Ht 5' 1.75" (1.568 m)   Wt 91.2 kg   SpO2 96%   BMI 37.06 kg/m   PROVIDERS: Merri Brunette, MD is PCP   Nanetta Batty, MD is Cardiologist  LABS: Labs reviewed: Acceptable for surgery. (all labs ordered are listed, but only abnormal results are displayed)  Labs Reviewed  BASIC METABOLIC PANEL - Abnormal; Notable for the following components:      Result Value   BUN 24 (*)    Anion gap 4 (*)    All other components within normal limits  SURGICAL PCR SCREEN  CBC     IMAGES:   EKG:   CV: Echo 11/14/22 1. Left ventricular ejection  fraction, by estimation, is 60 to 65%. The  left ventricle has normal function. The left ventricle has no regional  wall motion abnormalities. Left ventricular diastolic parameters were  normal.   2. Right ventricular systolic function is normal. The right ventricular  size is normal.   3. The mitral valve is normal in structure. No evidence of mitral valve  regurgitation. No evidence of mitral stenosis.   4. The aortic valve is tricuspid. There is moderate calcification of the  aortic valve. There is moderate thickening of the aortic valve. Aortic  valve regurgitation is not visualized. Aortic valve  sclerosis/calcification is present, without any evidence  of aortic stenosis.   5. The inferior vena cava is normal in size with greater than 50%  respiratory variability, suggesting right atrial pressure of 3 mmHg.   Cardiac Cath 11/13/22   Prox LAD to Mid LAD lesion is 50% stenosed.   Mid Cx to Dist Cx lesion is 10% stenosed.   Prox RCA to Mid RCA lesion is 99% stenosed.   Dist RCA lesion is 90% stenosed.   A drug-eluting stent was successfully placed using a STENT ONYX FRONTIER 2.5X12.   A drug-eluting stent was successfully placed using a SYNERGY XD 3.0X38.   Post intervention, there is a 0% residual stenosis.   Post intervention, there is a 0% residual stenosis.   The left ventricular systolic function is normal.   LV end  diastolic pressure is moderately elevated.   The left ventricular ejection fraction is 55-65% by visual estimate.   Single vessel obstructive CAD involving the mid and distal RCA. Prior stent in the LCx is widely patent Normal LV function Moderately elevated LVEDP 26 mm Hg Successful PCI of the mid and distal RCA with DES x 2   Plan: DAPT for at least 12 months. Anticipate DC tomorrow.   Past Medical History:  Diagnosis Date   Anxiety    Arthritis    Cancer (HCC)    Melanoma on back of left leg   Coronary artery disease    Family history of heart disease     Hyperlipidemia    Hypertension    Hypothyroidism    Myocardial infarction (HCC)    Neuromuscular disorder (HCC)    Parkinson's tremors etc   Parkinson disease (HCC)    PONV (postoperative nausea and vomiting)     Past Surgical History:  Procedure Laterality Date   ABDOMINAL HYSTERECTOMY     CATARACT EXTRACTION, BILATERAL     CORONARY ANGIOPLASTY WITH STENT PLACEMENT  10/24/2010   mid circ.AV groove 80%narrowing, 205X68mm Promus Element stent , entire region reduced to 0%   CORONARY STENT INTERVENTION N/A 11/13/2022   Procedure: CORONARY STENT INTERVENTION;  Surgeon: Swaziland, Peter M, MD;  Location: [facility] INVASIVE CV LAB;  Service: Cardiovascular;  Laterality: N/A;   KNEE ARTHROSCOPY W/ DEBRIDEMENT Right    LEFT HEART CATH AND CORONARY ANGIOGRAPHY N/A 11/13/2022   Procedure: LEFT HEART CATH AND CORONARY ANGIOGRAPHY;  Surgeon: Swaziland, Peter M, MD;  Location: [facility] INVASIVE CV LAB;  Service: Cardiovascular;  Laterality: N/A;   NM MYOCAR PERF WALL MOTION  12/27/2010   protocol:Bruce, EF 74%,no evidence of ischemia, exercise cap. 7 METS   right fracture elbow surgery      TRANSTHORACIC ECHOCARDIOGRAM  10/23/2010   Ef 50-55%,all cavity sizes normal, wall thickness normal, no regurg. n    MEDICATIONS:  aspirin 81 MG tablet   atorvastatin (LIPITOR) 80 MG tablet   B Complex-C (B-COMPLEX WITH VITAMIN C) tablet   calcium carbonate (OSCAL) 1500 (600 Ca) MG TABS tablet   Carbidopa-Levodopa ER (SINEMET CR) 25-100 MG tablet controlled release   citalopram (CELEXA) 10 MG tablet   clopidogrel (PLAVIX) 75 MG tablet   HYDROcodone-acetaminophen (NORCO/VICODIN) 5-325 MG tablet   levothyroxine (SYNTHROID, LEVOTHROID) 75 MCG tablet   lisinopril (PRINIVIL,ZESTRIL) 10 MG tablet   LORazepam (ATIVAN) 0.5 MG tablet   metoprolol tartrate (LOPRESSOR) 25 MG tablet   Multiple Vitamin (MULTIVITAMIN) tablet   nitroGLYCERIN (NITROSTAT) 0.4 MG SL tablet   pramipexole (MIRAPEX) 0.5 MG tablet   No current  facility-administered medications for this encounter.     Jodell Cipro Ward, PA-C [facility]-S[facility] [phone]

## 2023-09-25 NOTE — Anesthesia Preprocedure Evaluation (Addendum)
Anesthesia Evaluation  Patient identified by MRN, date of birth, ID band Patient awake    Reviewed: Allergy & Precautions, NPO status , Patient's Chart, lab work & pertinent test results  History of Anesthesia Complications (+) PONV  Airway Mallampati: II  TM Distance: >3 FB Neck ROM: Full    Dental no notable dental hx. (+) Teeth Intact, Dental Advisory Given   Pulmonary    Pulmonary exam normal breath sounds clear to auscultation       Cardiovascular hypertension, + CAD, + Cardiac Stents (Lcx 2012  DES RCA 10/2022 on plavix check last dose 12/4) and + Peripheral Vascular Disease  Normal cardiovascular exam Rhythm:Regular Rate:Normal  10/2022 TTE 1. Left ventricular ejection fraction, by estimation, is 60 to 65%. The  left ventricle has normal function. The left ventricle has no regional  wall motion abnormalities. Left ventricular diastolic parameters were  normal.   2. Right ventricular systolic function is normal. The right ventricular  size is normal.   3. The mitral valve is normal in structure. No evidence of mitral valve  regurgitation. No evidence of mitral stenosis.   4. The aortic valve is tricuspid. There is moderate calcification of the  aortic valve. There is moderate thickening of the aortic valve. Aortic  valve regurgitation is not visualized. Aortic valve  sclerosis/calcification is present, without any evidence  of aortic stenosis.   5. The inferior vena cava is normal in size with greater than 50%  respiratory variability, suggesting right atrial pressure of 3 mmHg.     Neuro/Psych   Anxiety      Neuromuscular disease (Parkinsons)    GI/Hepatic   Endo/Other  Hypothyroidism    Renal/GU Lab Results      Component                Value               Date                          K                        4.6                 09/24/2023                    CREATININE               0.88                 09/24/2023                     Musculoskeletal   Abdominal  (+) + obese (BMI 37.08)  Peds  Hematology Lab Results      Component                Value               Date                      WBC                      6.4                 09/24/2023                HGB  13.5                09/24/2023                HCT                      39.0                09/24/2023                MCV                      92.6                09/24/2023                PLT                      316                 09/24/2023              Anesthesia Other Findings   Reproductive/Obstetrics                             Anesthesia Physical Anesthesia Plan  ASA: 3  Anesthesia Plan: Spinal and Regional   Post-op Pain Management: Regional block* and Minimal or no pain anticipated   Induction:   PONV Risk Score and Plan: Propofol infusion, Treatment may vary due to age or medical condition and Ondansetron  Airway Management Planned: Mask and Natural Airway  Additional Equipment: None  Intra-op Plan:   Post-operative Plan:   Informed Consent: I have reviewed the patients History and Physical, chart, labs and discussed the procedure including the risks, benefits and alternatives for the proposed anesthesia with the patient or authorized representative who has indicated his/her understanding and acceptance.     Dental advisory given  Plan Discussed with: CRNA and Anesthesiologist  Anesthesia Plan Comments: (See PAT note 09/24/2023  Sp w L Adductor canal)       Anesthesia Quick Evaluation

## 2023-09-30 DIAGNOSIS — G20B1 Parkinson's disease with dyskinesia, without mention of fluctuations: Secondary | ICD-10-CM | POA: Diagnosis not present

## 2023-09-30 DIAGNOSIS — Z7902 Long term (current) use of antithrombotics/antiplatelets: Secondary | ICD-10-CM | POA: Diagnosis not present

## 2023-09-30 DIAGNOSIS — K219 Gastro-esophageal reflux disease without esophagitis: Secondary | ICD-10-CM | POA: Diagnosis not present

## 2023-09-30 DIAGNOSIS — E785 Hyperlipidemia, unspecified: Secondary | ICD-10-CM | POA: Diagnosis not present

## 2023-09-30 DIAGNOSIS — I10 Essential (primary) hypertension: Secondary | ICD-10-CM | POA: Diagnosis not present

## 2023-09-30 DIAGNOSIS — E669 Obesity, unspecified: Secondary | ICD-10-CM | POA: Diagnosis not present

## 2023-09-30 DIAGNOSIS — Z955 Presence of coronary angioplasty implant and graft: Secondary | ICD-10-CM | POA: Diagnosis not present

## 2023-09-30 DIAGNOSIS — E039 Hypothyroidism, unspecified: Secondary | ICD-10-CM | POA: Diagnosis not present

## 2023-09-30 DIAGNOSIS — Z1331 Encounter for screening for depression: Secondary | ICD-10-CM | POA: Diagnosis not present

## 2023-09-30 DIAGNOSIS — Z0000 Encounter for general adult medical examination without abnormal findings: Secondary | ICD-10-CM | POA: Diagnosis not present

## 2023-09-30 DIAGNOSIS — I25118 Atherosclerotic heart disease of native coronary artery with other forms of angina pectoris: Secondary | ICD-10-CM | POA: Diagnosis not present

## 2023-09-30 DIAGNOSIS — F419 Anxiety disorder, unspecified: Secondary | ICD-10-CM | POA: Diagnosis not present

## 2023-10-02 ENCOUNTER — Ambulatory Visit (HOSPITAL_COMMUNITY): Payer: Self-pay | Admitting: Anesthesiology

## 2023-10-02 ENCOUNTER — Ambulatory Visit (HOSPITAL_COMMUNITY): Payer: PPO | Admitting: Physician Assistant

## 2023-10-02 ENCOUNTER — Other Ambulatory Visit: Payer: Self-pay

## 2023-10-02 ENCOUNTER — Encounter (HOSPITAL_COMMUNITY): Payer: Self-pay | Admitting: Specialist

## 2023-10-02 ENCOUNTER — Encounter (HOSPITAL_COMMUNITY)
Admission: RE | Disposition: A | Discharge status: Home / Self Care | Payer: Self-pay | Source: Home / Self Care | Attending: Specialist

## 2023-10-02 ENCOUNTER — Ambulatory Visit (HOSPITAL_COMMUNITY)
Admission: RE | Admit: 2023-10-02 | Discharge: 2023-10-03 | Disposition: A | Discharge status: Home / Self Care | Payer: PPO | Attending: Specialist | Admitting: Specialist

## 2023-10-02 ENCOUNTER — Ambulatory Visit (HOSPITAL_COMMUNITY): Payer: PPO

## 2023-10-02 ENCOUNTER — Ambulatory Visit: Payer: Self-pay | Admitting: Orthopedic Surgery

## 2023-10-02 DIAGNOSIS — M170 Bilateral primary osteoarthritis of knee: Secondary | ICD-10-CM | POA: Insufficient documentation

## 2023-10-02 DIAGNOSIS — M21162 Varus deformity, not elsewhere classified, left knee: Secondary | ICD-10-CM | POA: Insufficient documentation

## 2023-10-02 DIAGNOSIS — Z955 Presence of coronary angioplasty implant and graft: Secondary | ICD-10-CM | POA: Diagnosis not present

## 2023-10-02 DIAGNOSIS — I10 Essential (primary) hypertension: Secondary | ICD-10-CM | POA: Diagnosis not present

## 2023-10-02 DIAGNOSIS — I700 Atherosclerosis of aorta: Secondary | ICD-10-CM | POA: Diagnosis not present

## 2023-10-02 DIAGNOSIS — I2510 Atherosclerotic heart disease of native coronary artery without angina pectoris: Secondary | ICD-10-CM | POA: Diagnosis not present

## 2023-10-02 DIAGNOSIS — Z96659 Presence of unspecified artificial knee joint: Secondary | ICD-10-CM

## 2023-10-02 DIAGNOSIS — G20A1 Parkinson's disease without dyskinesia, without mention of fluctuations: Secondary | ICD-10-CM | POA: Insufficient documentation

## 2023-10-02 DIAGNOSIS — Z96652 Presence of left artificial knee joint: Secondary | ICD-10-CM | POA: Diagnosis not present

## 2023-10-02 DIAGNOSIS — M1712 Unilateral primary osteoarthritis, left knee: Secondary | ICD-10-CM

## 2023-10-02 DIAGNOSIS — Z01818 Encounter for other preprocedural examination: Secondary | ICD-10-CM

## 2023-10-02 DIAGNOSIS — T797XXA Traumatic subcutaneous emphysema, initial encounter: Secondary | ICD-10-CM | POA: Diagnosis not present

## 2023-10-02 DIAGNOSIS — G8918 Other acute postprocedural pain: Secondary | ICD-10-CM | POA: Diagnosis not present

## 2023-10-02 DIAGNOSIS — Z471 Aftercare following joint replacement surgery: Secondary | ICD-10-CM | POA: Diagnosis not present

## 2023-10-02 HISTORY — PX: TOTAL KNEE ARTHROPLASTY: SHX125

## 2023-10-02 SURGERY — ARTHROPLASTY, KNEE, TOTAL
Anesthesia: Regional | Site: Knee | Laterality: Left

## 2023-10-02 MED ORDER — ALUM & MAG HYDROXIDE-SIMETH 200-200-20 MG/5ML PO SUSP: 30.0000 mL | ORAL | Status: DC | PRN

## 2023-10-02 MED ORDER — CARBIDOPA-LEVODOPA ER 25-100 MG PO TBCR
2.0000 | EXTENDED_RELEASE_TABLET | Freq: Every day | ORAL | Status: DC
  Administered 2023-10-03: 2 via ORAL
  Filled 2023-10-02: qty 2

## 2023-10-02 MED ORDER — DEXAMETHASONE SODIUM PHOSPHATE 10 MG/ML IJ SOLN
INTRAMUSCULAR | Status: AC
  Filled 2023-10-02: qty 1

## 2023-10-02 MED ORDER — 0.9 % SODIUM CHLORIDE (POUR BTL) OPTIME
TOPICAL | Status: DC | PRN
  Administered 2023-10-02: 1000 mL

## 2023-10-02 MED ORDER — ONDANSETRON HCL 4 MG/2ML IJ SOLN
INTRAMUSCULAR | Status: DC | PRN
  Administered 2023-10-02: 4 mg via INTRAVENOUS

## 2023-10-02 MED ORDER — BUPIVACAINE IN DEXTROSE 0.75-8.25 % IT SOLN
INTRATHECAL | Status: DC | PRN
  Administered 2023-10-02: 13.5 mg via INTRATHECAL

## 2023-10-02 MED ORDER — PRAMIPEXOLE DIHYDROCHLORIDE 0.25 MG PO TABS
0.5000 mg | ORAL_TABLET | Freq: Three times a day (TID) | ORAL | Status: DC
  Administered 2023-10-02 – 2023-10-03 (×3): 0.5 mg via ORAL
  Filled 2023-10-02 (×3): qty 2

## 2023-10-02 MED ORDER — PROPOFOL 500 MG/50ML IV EMUL
INTRAVENOUS | Status: DC | PRN
  Administered 2023-10-02: 60 ug/kg/min via INTRAVENOUS

## 2023-10-02 MED ORDER — FENTANYL CITRATE PF 50 MCG/ML IJ SOSY: 25.0000 ug | PREFILLED_SYRINGE | INTRAMUSCULAR | Status: DC | PRN

## 2023-10-02 MED ORDER — CALCIUM CARBONATE 1250 (500 CA) MG PO TABS
1.0000 | ORAL_TABLET | Freq: Every day | ORAL | Status: DC
  Administered 2023-10-03: 1250 mg via ORAL
  Filled 2023-10-02: qty 1

## 2023-10-02 MED ORDER — CEFAZOLIN SODIUM-DEXTROSE 2-4 GM/100ML-% IV SOLN
2.0000 g | INTRAVENOUS | Status: AC
  Administered 2023-10-02: 2 g via INTRAVENOUS
  Filled 2023-10-02: qty 100

## 2023-10-02 MED ORDER — CEFAZOLIN SODIUM-DEXTROSE 2-4 GM/100ML-% IV SOLN
2.0000 g | Freq: Four times a day (QID) | INTRAVENOUS | Status: AC
Start: 2023-10-02 — End: 2023-10-02
  Administered 2023-10-02 (×2): 2 g via INTRAVENOUS
  Filled 2023-10-02 (×2): qty 100

## 2023-10-02 MED ORDER — ADULT MULTIVITAMIN W/MINERALS CH
1.0000 | ORAL_TABLET | Freq: Every day | ORAL | Status: DC
  Administered 2023-10-03: 1 via ORAL
  Filled 2023-10-02: qty 1

## 2023-10-02 MED ORDER — OXYCODONE HCL 5 MG PO TABS: 5.0000 mg | ORAL_TABLET | Freq: Once | ORAL | Status: DC | PRN

## 2023-10-02 MED ORDER — EPHEDRINE SULFATE (PRESSORS) 50 MG/ML IJ SOLN
INTRAMUSCULAR | Status: DC | PRN
  Administered 2023-10-02: 10 mg via INTRAVENOUS

## 2023-10-02 MED ORDER — CLONIDINE HCL (ANALGESIA) 100 MCG/ML EP SOLN
EPIDURAL | Status: DC | PRN
  Administered 2023-10-02: 100 ug

## 2023-10-02 MED ORDER — CHLORHEXIDINE GLUCONATE 0.12 % MT SOLN
15.0000 mL | Freq: Once | OROMUCOSAL | Status: AC
  Administered 2023-10-02: 15 mL via OROMUCOSAL

## 2023-10-02 MED ORDER — ORAL CARE MOUTH RINSE
15.0000 mL | Freq: Once | OROMUCOSAL | Status: AC
Start: 2023-10-02 — End: 2023-10-02

## 2023-10-02 MED ORDER — SODIUM CHLORIDE 0.9 % IR SOLN
Status: DC | PRN
  Administered 2023-10-02: 1000 mL
  Administered 2023-10-02: 250 mL

## 2023-10-02 MED ORDER — LEVOTHYROXINE SODIUM 75 MCG PO TABS
75.0000 ug | ORAL_TABLET | Freq: Every day | ORAL | Status: DC
  Administered 2023-10-03: 75 ug via ORAL
  Filled 2023-10-02: qty 1

## 2023-10-02 MED ORDER — ONDANSETRON HCL 4 MG/2ML IJ SOLN: 4.0000 mg | Freq: Once | INTRAMUSCULAR | Status: DC | PRN

## 2023-10-02 MED ORDER — CITALOPRAM HYDROBROMIDE 20 MG PO TABS
10.0000 mg | ORAL_TABLET | Freq: Every day | ORAL | Status: DC
Start: 2023-10-02 — End: 2023-10-03
  Administered 2023-10-02: 10 mg via ORAL
  Filled 2023-10-02: qty 1

## 2023-10-02 MED ORDER — OXYCODONE HCL 5 MG/5ML PO SOLN
5.0000 mg | Freq: Once | ORAL | Status: DC | PRN
Start: 2023-10-02 — End: 2023-10-02

## 2023-10-02 MED ORDER — CARBIDOPA-LEVODOPA ER 25-100 MG PO TBCR
1.0000 | EXTENDED_RELEASE_TABLET | Freq: Every day | ORAL | Status: DC
  Administered 2023-10-02: 1 via ORAL
  Filled 2023-10-02 (×2): qty 1

## 2023-10-02 MED ORDER — LACTATED RINGERS IV SOLN: INTRAVENOUS | Status: DC | PRN

## 2023-10-02 MED ORDER — STERILE WATER FOR IRRIGATION IR SOLN
Status: DC | PRN
  Administered 2023-10-02: 1000 mL

## 2023-10-02 MED ORDER — CLOPIDOGREL BISULFATE 75 MG PO TABS
75.0000 mg | ORAL_TABLET | Freq: Every day | ORAL | Status: DC
  Administered 2023-10-03: 75 mg via ORAL
  Filled 2023-10-02: qty 1

## 2023-10-02 MED ORDER — MIDAZOLAM HCL 5 MG/5ML IJ SOLN
INTRAMUSCULAR | Status: DC | PRN
  Administered 2023-10-02: 1 mg via INTRAVENOUS

## 2023-10-02 MED ORDER — ACETAMINOPHEN 10 MG/ML IV SOLN
1000.0000 mg | INTRAVENOUS | Status: AC
  Administered 2023-10-02: 1000 mg via INTRAVENOUS
  Filled 2023-10-02: qty 100

## 2023-10-02 MED ORDER — B COMPLEX-C PO TABS
1.0000 | ORAL_TABLET | Freq: Every day | ORAL | Status: DC
  Administered 2023-10-02 – 2023-10-03 (×2): 1 via ORAL
  Filled 2023-10-02 (×2): qty 1

## 2023-10-02 MED ORDER — SODIUM CHLORIDE (PF) 0.9 % IJ SOLN
INTRAMUSCULAR | Status: DC | PRN
  Administered 2023-10-02: 60 mL

## 2023-10-02 MED ORDER — HYDROMORPHONE HCL 1 MG/ML IJ SOLN: 0.5000 mg | INTRAMUSCULAR | Status: DC | PRN

## 2023-10-02 MED ORDER — LORAZEPAM 0.5 MG PO TABS: 0.5000 mg | ORAL_TABLET | Freq: Every day | ORAL | Status: DC | PRN

## 2023-10-02 MED ORDER — LISINOPRIL 10 MG PO TABS
10.0000 mg | ORAL_TABLET | Freq: Every day | ORAL | Status: DC
  Administered 2023-10-03: 10 mg via ORAL
  Filled 2023-10-02: qty 1

## 2023-10-02 MED ORDER — PRAMIPEXOLE DIHYDROCHLORIDE 0.25 MG PO TABS
0.5000 mg | ORAL_TABLET | Freq: Three times a day (TID) | ORAL | Status: DC
Start: 2023-10-02 — End: 2023-10-02

## 2023-10-02 MED ORDER — ATORVASTATIN CALCIUM 40 MG PO TABS
80.0000 mg | ORAL_TABLET | Freq: Every day | ORAL | Status: DC
  Administered 2023-10-02 – 2023-10-03 (×2): 80 mg via ORAL
  Filled 2023-10-02 (×2): qty 2

## 2023-10-02 MED ORDER — PROPOFOL 1000 MG/100ML IV EMUL
INTRAVENOUS | Status: AC
  Filled 2023-10-02: qty 100

## 2023-10-02 MED ORDER — FENTANYL CITRATE (PF) 100 MCG/2ML IJ SOLN
INTRAMUSCULAR | Status: DC | PRN
  Administered 2023-10-02: 50 ug via INTRAVENOUS

## 2023-10-02 MED ORDER — ACETAMINOPHEN 325 MG PO TABS: 325.0000 mg | ORAL_TABLET | ORAL | Status: DC | PRN

## 2023-10-02 MED ORDER — MEPERIDINE HCL 50 MG/ML IJ SOLN: 6.2500 mg | INTRAMUSCULAR | Status: DC | PRN

## 2023-10-02 MED ORDER — FENTANYL CITRATE (PF) 100 MCG/2ML IJ SOLN
INTRAMUSCULAR | Status: AC
  Filled 2023-10-02: qty 2

## 2023-10-02 MED ORDER — MENTHOL 3 MG MT LOZG: 1.0000 | LOZENGE | OROMUCOSAL | Status: DC | PRN

## 2023-10-02 MED ORDER — LACTATED RINGERS IV SOLN: INTRAVENOUS | Status: DC

## 2023-10-02 MED ORDER — PROPOFOL 10 MG/ML IV BOLUS
INTRAVENOUS | Status: DC | PRN
  Administered 2023-10-02: 10 mg via INTRAVENOUS
  Administered 2023-10-02: 30 mg via INTRAVENOUS

## 2023-10-02 MED ORDER — ONE-DAILY MULTI VITAMINS PO TABS: 1.0000 | ORAL_TABLET | Freq: Every day | ORAL | Status: DC

## 2023-10-02 MED ORDER — ACETAMINOPHEN 500 MG PO TABS
1000.0000 mg | ORAL_TABLET | Freq: Four times a day (QID) | ORAL | Status: AC
  Administered 2023-10-02 – 2023-10-03 (×4): 1000 mg via ORAL
  Filled 2023-10-02 (×4): qty 2

## 2023-10-02 MED ORDER — PHENYLEPHRINE HCL-NACL 20-0.9 MG/250ML-% IV SOLN
INTRAVENOUS | Status: DC | PRN
  Administered 2023-10-02: 40 ug/min via INTRAVENOUS

## 2023-10-02 MED ORDER — MAGNESIUM CITRATE PO SOLN
1.0000 | Freq: Once | ORAL | Status: DC | PRN
Start: 2023-10-02 — End: 2023-10-03

## 2023-10-02 MED ORDER — METOPROLOL TARTRATE 25 MG PO TABS
25.0000 mg | ORAL_TABLET | Freq: Two times a day (BID) | ORAL | Status: DC
  Administered 2023-10-02 – 2023-10-03 (×2): 25 mg via ORAL
  Filled 2023-10-02 (×2): qty 1

## 2023-10-02 MED ORDER — CARBIDOPA-LEVODOPA ER 25-100 MG PO TBCR: 2.0000 | EXTENDED_RELEASE_TABLET | Freq: Two times a day (BID) | ORAL | Status: DC

## 2023-10-02 MED ORDER — DOCUSATE SODIUM 100 MG PO CAPS
100.0000 mg | ORAL_CAPSULE | Freq: Two times a day (BID) | ORAL | Status: DC
  Administered 2023-10-02 – 2023-10-03 (×2): 100 mg via ORAL
  Filled 2023-10-02 (×2): qty 1

## 2023-10-02 MED ORDER — BISACODYL 5 MG PO TBEC: 5.0000 mg | DELAYED_RELEASE_TABLET | Freq: Every day | ORAL | Status: DC | PRN

## 2023-10-02 MED ORDER — PHENOL 1.4 % MT LIQD: 1.0000 | OROMUCOSAL | Status: DC | PRN

## 2023-10-02 MED ORDER — BUPIVACAINE LIPOSOME 1.3 % IJ SUSP
INTRAMUSCULAR | Status: AC
  Filled 2023-10-02: qty 20

## 2023-10-02 MED ORDER — DEXAMETHASONE SODIUM PHOSPHATE 10 MG/ML IJ SOLN
INTRAMUSCULAR | Status: DC | PRN
  Administered 2023-10-02: 8 mg via INTRAVENOUS

## 2023-10-02 MED ORDER — POLYETHYLENE GLYCOL 3350 17 G PO PACK
17.0000 g | PACK | Freq: Every day | ORAL | Status: DC | PRN
Start: 2023-10-02 — End: 2023-10-03

## 2023-10-02 MED ORDER — CALCIUM CARBONATE 1500 (600 CA) MG PO TABS: 1.0000 | ORAL_TABLET | Freq: Every day | ORAL | Status: DC

## 2023-10-02 MED ORDER — ACETAMINOPHEN 160 MG/5ML PO SOLN: 325.0000 mg | ORAL | Status: DC | PRN

## 2023-10-02 MED ORDER — TRANEXAMIC ACID-NACL 1000-0.7 MG/100ML-% IV SOLN
1000.0000 mg | INTRAVENOUS | Status: AC
  Administered 2023-10-02: 1000 mg via INTRAVENOUS
  Filled 2023-10-02: qty 100

## 2023-10-02 MED ORDER — NITROGLYCERIN 0.4 MG SL SUBL: 0.4000 mg | SUBLINGUAL_TABLET | SUBLINGUAL | Status: DC | PRN

## 2023-10-02 MED ORDER — KCL IN DEXTROSE-NACL 20-5-0.45 MEQ/L-%-% IV SOLN
INTRAVENOUS | Status: DC
  Filled 2023-10-02 (×2): qty 1000

## 2023-10-02 MED ORDER — ASPIRIN 81 MG PO CHEW
81.0000 mg | CHEWABLE_TABLET | Freq: Two times a day (BID) | ORAL | Status: DC
  Administered 2023-10-03: 81 mg via ORAL
  Filled 2023-10-02: qty 1

## 2023-10-02 MED ORDER — PHENYLEPHRINE HCL-NACL 20-0.9 MG/250ML-% IV SOLN
INTRAVENOUS | Status: AC
  Filled 2023-10-02: qty 250

## 2023-10-02 MED ORDER — ONDANSETRON HCL 4 MG/2ML IJ SOLN
INTRAMUSCULAR | Status: AC
  Filled 2023-10-02: qty 2

## 2023-10-02 MED ORDER — RISAQUAD PO CAPS
1.0000 | ORAL_CAPSULE | Freq: Every day | ORAL | Status: DC
  Administered 2023-10-02 – 2023-10-03 (×2): 1 via ORAL
  Filled 2023-10-02 (×2): qty 1

## 2023-10-02 MED ORDER — ONDANSETRON HCL 4 MG PO TABS: 4.0000 mg | ORAL_TABLET | Freq: Four times a day (QID) | ORAL | Status: DC | PRN

## 2023-10-02 MED ORDER — PROPOFOL 500 MG/50ML IV EMUL
INTRAVENOUS | Status: AC
  Filled 2023-10-02: qty 50

## 2023-10-02 MED ORDER — DIPHENHYDRAMINE HCL 12.5 MG/5ML PO ELIX: 12.5000 mg | ORAL_SOLUTION | ORAL | Status: DC | PRN

## 2023-10-02 MED ORDER — OXYCODONE HCL 5 MG PO TABS
5.0000 mg | ORAL_TABLET | ORAL | Status: DC | PRN
  Administered 2023-10-02: 5 mg via ORAL
  Administered 2023-10-02 – 2023-10-03 (×3): 10 mg via ORAL
  Filled 2023-10-02: qty 1
  Filled 2023-10-02 (×3): qty 2

## 2023-10-02 MED ORDER — BUPIVACAINE-EPINEPHRINE 0.25% -1:200000 IJ SOLN
INTRAMUSCULAR | Status: DC | PRN
  Administered 2023-10-02: 20 mL

## 2023-10-02 MED ORDER — MIDAZOLAM HCL 2 MG/2ML IJ SOLN
INTRAMUSCULAR | Status: AC
  Filled 2023-10-02: qty 2

## 2023-10-02 MED ORDER — SODIUM CHLORIDE (PF) 0.9 % IJ SOLN
INTRAMUSCULAR | Status: AC
  Filled 2023-10-02: qty 50

## 2023-10-02 MED ORDER — ONDANSETRON HCL 4 MG/2ML IJ SOLN
4.0000 mg | Freq: Four times a day (QID) | INTRAMUSCULAR | Status: DC | PRN
Start: 2023-10-02 — End: 2023-10-03

## 2023-10-02 MED ORDER — ROPIVACAINE HCL 5 MG/ML IJ SOLN
INTRAMUSCULAR | Status: DC | PRN
  Administered 2023-10-02: 30 mL via PERINEURAL

## 2023-10-02 MED ORDER — BUPIVACAINE-EPINEPHRINE 0.25% -1:200000 IJ SOLN
INTRAMUSCULAR | Status: AC
Start: 2023-10-02 — End: ?
  Filled 2023-10-02: qty 1

## 2023-10-02 SURGICAL SUPPLY — 62 items
ATTUNE MED DOME PAT 32 KNEE (Knees) IMPLANT
ATTUNE PS FEM LT SZ 4 CEM KNEE (Femur) IMPLANT
ATTUNE PSRP INSR SZ4 10 KNEE (Insert) IMPLANT
BAG COUNTER SPONGE SURGICOUNT (BAG) IMPLANT
BAG ZIPLOCK 12X15 (MISCELLANEOUS) IMPLANT
BASEPLATE TIBIAL ROTATING SZ 4 (Knees) IMPLANT
BLADE SAW SGTL 11.0X1.19X90.0M (BLADE) ×1 IMPLANT
BLADE SAW SGTL 13.0X1.19X90.0M (BLADE) ×1 IMPLANT
BLADE SURG SZ10 CARB STEEL (BLADE) ×2 IMPLANT
BNDG ELASTIC 4INX 5YD STR LF (GAUZE/BANDAGES/DRESSINGS) ×1 IMPLANT
BNDG ELASTIC 6INX 5YD STR LF (GAUZE/BANDAGES/DRESSINGS) ×1 IMPLANT
BOWL SMART MIX CTS (DISPOSABLE) ×1 IMPLANT
CEMENT HV SMART SET (Cement) ×2 IMPLANT
COVER SURGICAL LIGHT HANDLE (MISCELLANEOUS) ×1 IMPLANT
CUFF TRNQT CYL 34X4.125X (TOURNIQUET CUFF) ×1 IMPLANT
DRAPE INCISE IOBAN 66X45 STRL (DRAPES) IMPLANT
DRAPE SHEET LG 3/4 BI-LAMINATE (DRAPES) ×1 IMPLANT
DRAPE SURG ORHT 6 SPLT 77X108 (DRAPES) ×2 IMPLANT
DRAPE TOP 10253 STERILE (DRAPES) ×1 IMPLANT
DRAPE U-SHAPE 47X51 STRL (DRAPES) ×1 IMPLANT
DRSG AQUACEL AG ADV 3.5X10 (GAUZE/BANDAGES/DRESSINGS) ×1 IMPLANT
DRSG TEGADERM 4X4.75 (GAUZE/BANDAGES/DRESSINGS) IMPLANT
DURAPREP 26ML APPLICATOR (WOUND CARE) ×1 IMPLANT
ELECT BLADE TIP CTD 4 INCH (ELECTRODE) ×1 IMPLANT
ELECT REM PT RETURN 15FT ADLT (MISCELLANEOUS) ×1 IMPLANT
EVACUATOR 1/8 PVC DRAIN (DRAIN) IMPLANT
GAUZE SPONGE 2X2 8PLY STRL LF (GAUZE/BANDAGES/DRESSINGS) IMPLANT
GLOVE BIO SURGEON STRL SZ7 (GLOVE) ×1 IMPLANT
GLOVE BIOGEL PI IND STRL 7.0 (GLOVE) ×1 IMPLANT
GLOVE BIOGEL PI IND STRL 8 (GLOVE) ×1 IMPLANT
GLOVE SURG SS PI 8.0 STRL IVOR (GLOVE) ×1 IMPLANT
GOWN STRL REUS W/ TWL XL LVL3 (GOWN DISPOSABLE) ×2 IMPLANT
HEMOSTAT SPONGE AVITENE ULTRA (HEMOSTASIS) IMPLANT
HOLDER FOLEY CATH W/STRAP (MISCELLANEOUS) IMPLANT
IMMOBILIZER KNEE 20 (SOFTGOODS) ×1
IMMOBILIZER KNEE 20 THIGH 36 (SOFTGOODS) ×1 IMPLANT
KIT TURNOVER KIT A (KITS) IMPLANT
MANIFOLD NEPTUNE II (INSTRUMENTS) ×1 IMPLANT
NS IRRIG 1000ML POUR BTL (IV SOLUTION) IMPLANT
PACK TOTAL KNEE CUSTOM (KITS) ×1 IMPLANT
PIN STEINMAN FIXATION KNEE (PIN) IMPLANT
PROTECTOR NERVE ULNAR (MISCELLANEOUS) ×1 IMPLANT
SAW OSC TIP CART 19.5X105X1.3 (SAW) IMPLANT
SEALER BIPOLAR AQUA 6.0 (INSTRUMENTS) IMPLANT
SET HNDPC FAN SPRY TIP SCT (DISPOSABLE) ×1 IMPLANT
SOLUTION PRONTOSAN WOUND 350ML (IRRIGATION / IRRIGATOR) ×1 IMPLANT
SPIKE FLUID TRANSFER (MISCELLANEOUS) ×1 IMPLANT
STAPLER VISISTAT (STAPLE) IMPLANT
STRIP CLOSURE SKIN 1/2X4 (GAUZE/BANDAGES/DRESSINGS) IMPLANT
SUT BONE WAX W31G (SUTURE) ×1 IMPLANT
SUT MNCRL AB 4-0 PS2 18 (SUTURE) IMPLANT
SUT STRATAFIX 0 PDS 27 VIOLET (SUTURE) ×1
SUT VIC AB 1 CT1 27XBRD ANTBC (SUTURE) ×3 IMPLANT
SUT VIC AB 2-0 CT1 TAPERPNT 27 (SUTURE) ×3 IMPLANT
SUTURE STRATFX 0 PDS 27 VIOLET (SUTURE) ×1 IMPLANT
SYR 3ML LL SCALE MARK (SYRINGE) IMPLANT
TRAY FOLEY MTR SLVR 14FR STAT (SET/KITS/TRAYS/PACK) IMPLANT
TRAY FOLEY MTR SLVR 16FR STAT (SET/KITS/TRAYS/PACK) ×1 IMPLANT
TUBE SUCTION HIGH CAP CLEAR NV (SUCTIONS) ×1 IMPLANT
WATER STERILE IRR 1000ML POUR (IV SOLUTION) ×1 IMPLANT
WIPE CHG 2% PREP (PERSONAL CARE ITEMS) ×1 IMPLANT
WRAP KNEE MAXI GEL POST OP (GAUZE/BANDAGES/DRESSINGS) ×1 IMPLANT

## 2023-10-02 NOTE — H&P (View-Only) (Signed)
Julie Carey is an 72 y.o. female.   Chief Complaint: left knee pain HPI: Patient is here for her H&P. Patient is scheduled for a LTKR by Dr. Shelle Iron at [facility] on 10/02/23.  Dr. Shelle Iron and the patient mutually agreed to proceed with a total knee replacement. Risks and benefits of the procedure were discussed including stiffness, suboptimal range of motion, persistent pain, infection requiring removal of prosthesis and reinsertion, need for prophylactic antibiotics in the future, for example, dental procedures, possible need for manipulation, revision in the future and also anesthetic complications including DVT, PE, etc. We discussed the perioperative course, time in the hospital, postoperative recovery and the need for elevation to control swelling. We also discussed the predicted range of motion and the probability that squatting and kneeling would be unobtainable in the future. In addition, postoperative anticoagulation was discussed. We have obtained preoperative medical clearance as necessary. Provided illustrated handout and discussed it in detail. They will enroll in the total joint replacement educational forum at the hospital.  She has been cleared and is to stop her Plavix 5 days preop. She does already have a walker, commode chair, shower chair.  Past Medical History:  Diagnosis Date   Anxiety    Arthritis    Cancer (HCC)    Melanoma on back of left leg   Coronary artery disease    Family history of heart disease    Hyperlipidemia    Hypertension    Hypothyroidism    Myocardial infarction (HCC)    Neuromuscular disorder (HCC)    Parkinson's tremors etc   Parkinson disease (HCC)    PONV (postoperative nausea and vomiting)     Past Surgical History:  Procedure Laterality Date   ABDOMINAL HYSTERECTOMY     CATARACT EXTRACTION, BILATERAL     CORONARY ANGIOPLASTY WITH STENT PLACEMENT  10/24/2010   mid circ.AV groove 80%narrowing, 205X29mm Promus Element stent , entire  region reduced to 0%   CORONARY STENT INTERVENTION N/A 11/13/2022   Procedure: CORONARY STENT INTERVENTION;  Surgeon: Swaziland, Peter M, MD;  Location: [facility] INVASIVE CV LAB;  Service: Cardiovascular;  Laterality: N/A;   KNEE ARTHROSCOPY W/ DEBRIDEMENT Right    LEFT HEART CATH AND CORONARY ANGIOGRAPHY N/A 11/13/2022   Procedure: LEFT HEART CATH AND CORONARY ANGIOGRAPHY;  Surgeon: Swaziland, Peter M, MD;  Location: [facility] INVASIVE CV LAB;  Service: Cardiovascular;  Laterality: N/A;   NM MYOCAR PERF WALL MOTION  12/27/2010   protocol:Bruce, EF 74%,no evidence of ischemia, exercise cap. 7 METS   right fracture elbow surgery      TRANSTHORACIC ECHOCARDIOGRAM  10/23/2010   Ef 50-55%,all cavity sizes normal, wall thickness normal, no regurg. n    Family History  Problem Relation Age of Onset   Heart failure Father    Hypertension Mother    Stroke Mother    Hydrocephalus Brother    Social History:  reports that she has never smoked. She has never used smokeless tobacco. She reports that she does not drink alcohol and does not use drugs.  Allergies:  Allergies  Allergen Reactions   Rosuvastatin     Joint pain on 40mg  daily    Current meds: aspirin 81 mg tablet,delayed release atorvastatin 80 mg table carbidopa 25 mg-levodopa 100 mg tablet carbidopa ER 25 mg-levodopa 100 mg tablet,extended release citalopram 10 mg tablet clopidogreL 75 mg tablet HYDROcodone 5 mg-acetaminophen 325 mg tablet levothyroxine 75 mcg tablet lisinopriL 10 mg tablet LORazepam 0.5 mg tablet metoprolol tartrate 25 mg tablet  nitroglycerin 0.4 mg sublingual tablet pramipexole 0.5 mg tablet  Review of Systems  Constitutional: Negative.   HENT: Negative.    Eyes: Negative.   Respiratory: Negative.    Cardiovascular: Negative.   Gastrointestinal: Negative.   Endocrine: Negative.   Genitourinary: Negative.   Musculoskeletal:  Positive for arthralgias, gait problem and joint swelling.  Skin: Negative.    Hematological: Negative.   Psychiatric/Behavioral: Negative.      There were no vitals taken for this visit. Physical Exam Constitutional:      Appearance: Normal appearance.  HENT:     Head: Normocephalic and atraumatic.     Right Ear: External ear normal.     Left Ear: External ear normal.     Nose: Nose normal.     Mouth/Throat:     Pharynx: Oropharynx is clear.  Eyes:     Conjunctiva/sclera: Conjunctivae normal.  Cardiovascular:     Rate and Rhythm: Normal rate.     Pulses: Normal pulses.  Pulmonary:     Effort: Pulmonary effort is normal.  Abdominal:     General: Bowel sounds are normal.  Musculoskeletal:     Cervical back: Normal range of motion.     Comments: Left knee tender medial joint line patellofemoral pain compression ranges 0-90. No DVT ipsilateral hip and ankle exam is unremarkable. Antalgic gait.  Skin:    General: Skin is warm and dry.  Neurological:     Mental Status: She is alert.    Prior x-rays reviewed with end-stage medial joint space narrowing bilateral knees with a varus deformity.  Assessment/Plan Impression: End-stage left knee osteoarthritis  Plan: Pt with end-stage left knee DJD, bone-on-bone, refractory to conservative tx, scheduled for left total knee replacement by Dr. Shelle Iron on December 11. We again discussed the procedure itself as well as risks, complications and alternatives, including but not limited to DVT, PE, infx, bleeding, failure of procedure, need for secondary procedure including manipulation, nerve injury, ongoing pain/symptoms, anesthesia risk, even stroke or death. Also discussed typical post-op protocols, activity restrictions, need for PT, flexion/extension exercises, time out of work. Discussed need for DVT ppx post-op per protocol. Discussed dental ppx and infx prevention. Also discussed limitations post-operatively such as kneeling and squatting. All questions were answered. Patient desires to proceed with surgery as  scheduled.  Will hold supplements, ASA and NSAIDs accordingly. Will remain NPO after midnight the night before surgery. Will present to [facility] for pre-op testing. Anticipate hospital stay to include at least 2 midnights given medical history and to ensure proper pain control. Plan aspirin for DVT ppx post-op, resume Plavix. Plan oxycodone, tizanidine, Colace, Miralax. Plan home with HHPT post-op with family members at home for assistance. Will follow up 10-14 days post-op for suture removal and xrays.  Plan Left total knee replacement  Dorothy Spark, PA-C for Dr Shelle Iron 10/02/2023, 8:09 AM

## 2023-10-02 NOTE — Anesthesia Postprocedure Evaluation (Signed)
Anesthesia Post Note  Patient: Julie Carey  Procedure(s) Performed: TOTAL KNEE ARTHROPLASTY (Left: Knee)     Patient location during evaluation: Nursing Unit Anesthesia Type: Regional and Spinal Level of consciousness: oriented and awake and alert Pain management: pain level controlled Vital Signs Assessment: post-procedure vital signs reviewed and stable Respiratory status: spontaneous breathing and respiratory function stable Cardiovascular status: blood pressure returned to baseline and stable Postop Assessment: no headache, no backache, no apparent nausea or vomiting and patient able to bend at knees Anesthetic complications: no   No notable events documented.  Last Vitals:  Vitals:   10/02/23 1252 10/02/23 1453  BP: 114/63 129/78  Pulse: 65 70  Resp: 16 18  Temp: (!) 36.4 C 36.6 C  SpO2: 95% 95%    Last Pain:  Vitals:   10/02/23 1252  TempSrc: Oral  PainSc: 2                  [name]

## 2023-10-02 NOTE — Interval H&P Note (Signed)
History and Physical Interval Note:  10/02/2023 8:20 AM  Julie Carey  has presented today for surgery, with the diagnosis of left knee osteoarthritis.  The various methods of treatment have been discussed with the patient and family. After consideration of risks, benefits and other options for treatment, the patient has consented to  Procedure(s): TOTAL KNEE ARTHROPLASTY (Left) as a surgical intervention.  The patient's history has been reviewed, patient examined, no change in status, stable for surgery.  I have reviewed the patient's chart and labs.  Questions were answered to the patient's satisfaction.     [name]

## 2023-10-02 NOTE — Brief Op Note (Addendum)
10/02/2023  8:20 AM  PATIENT:  Julie Carey  72 y.o. female  PRE-OPERATIVE DIAGNOSIS:  left knee osteoarthritis  POST-OPERATIVE DIAGNOSIS:  * No post-op diagnosis entered *  PROCEDURE:  Procedure(s): TOTAL KNEE ARTHROPLASTY (Left)  The aquamantis was utilized for this case to help facilitate better hemostasis as patient was felt to be at increased risk of bleeding because of complex case requiring increased OR time and/or exposure.   TT: 74 min  SURGEON:  Surgeons and Role:    Jene Every, MD - Primary  PHYSICIAN ASSISTANT:   ASSISTANTS: Bissell   ANESTHESIA:   spinal  EBL:  150   BLOOD ADMINISTERED:none  DRAINS: none   LOCAL MEDICATIONS USED:  MARCAINE     SPECIMEN:  No Specimen  DISPOSITION OF SPECIMEN:  N/A  COUNTS:  YES  TOURNIQUET:  * Missing tourniquet times found for documented tourniquets in log: [id number] *  DICTATION: .Other Dictation: Dictation Number [id number]   PLAN OF CARE: Admit for overnight observation  PATIENT DISPOSITION:  PACU - hemodynamically stable.   Delay start of Pharmacological VTE agent (>24hrs) due to surgical blood loss or risk of bleeding: no

## 2023-10-02 NOTE — Evaluation (Signed)
Physical Therapy Evaluation Patient Details Name: Julie Carey MRN: [id number] DOB: [birth year] Today's Date: 10/02/2023  History of Present Illness  72 yo female presents to therapy s/p L TKA on 10/02/2023 due to failure of conservative measures. [name], [name], [name], HTN, MI, and PD.  Clinical Impression   Julie Carey is a 72 y.o. female POD 0 s/p L TKA. Patient reports mod I with use of rollator for mobility at baseline. Patient is now limited by functional impairments (see PT problem list below) and requires CGA for bed mobility and CGA and cues for transfers. Patient was able to ambulate 45 feet with RW and CGA level of assist. Patient instructed in exercise to facilitate ROM and circulation to manage edema.  Patient will benefit from continued skilled PT interventions to address impairments and progress towards PLOF. Acute PT will follow to progress mobility and stair training in preparation for safe discharge home with family support and [facility] services.       If plan is discharge home, recommend the following: A little help with walking and/or transfers;A little help with bathing/dressing/bathroom;Assistance with cooking/housework;Assist for transportation;Help with stairs or ramp for entrance   Can travel by private vehicle        Equipment Recommendations Rolling walker (2 wheels) (Youth)  Recommendations for Other Services       Functional Status Assessment Patient has had a recent decline in their functional status and demonstrates the ability to make significant improvements in function in a reasonable and predictable amount of time.     Precautions / Restrictions Precautions Precautions: Knee;Fall Restrictions Weight Bearing Restrictions: No      Mobility  Bed Mobility Overal bed mobility: Needs Assistance Bed Mobility: Supine to Sit     Supine to sit: Supervision, HOB elevated, Used rails     General  bed mobility comments: min cues    Transfers Overall transfer level: Needs assistance Equipment used: Rolling walker (2 wheels) Transfers: Sit to/from Stand Sit to Stand: Contact guard assist           General transfer comment: min cues and push to stand    Ambulation/Gait Ambulation/Gait assistance: Contact guard assist Gait Distance (Feet): 45 Feet Assistive device: Rolling walker (2 wheels) Gait Pattern/deviations: Step-to pattern, Antalgic, Trunk flexed Gait velocity: decreased     General Gait Details: B UE support at RW to offload L LE in stance phase, pt indicates able to put more weight on L LE currently then prior to sx, pt reports feeling leg length discrepancy L> R. min cues for step to pattern  Stairs            Wheelchair Mobility     Tilt Bed    Modified Rankin (Stroke Patients Only)       Balance Overall balance assessment: Needs assistance Sitting-balance support: Feet supported Sitting balance-Leahy Scale: Good     Standing balance support: Bilateral upper extremity supported, During functional activity, Reliant on assistive device for balance Standing balance-Leahy Scale: Poor                               Pertinent Vitals/Pain Pain Assessment Pain Assessment: 0-10 Pain Score: 3  Pain Location: L knee and LE Pain Descriptors / Indicators: Aching, Dull, Grimacing, Operative site guarding, Constant Pain Intervention(s): Limited activity within patient's tolerance, Monitored during session, Premedicated before session, Repositioned, Ice applied  Home Living Family/patient expects to be discharged to:: Private residence Living Arrangements: Spouse/significant other Available Help at Discharge: Family (daughter) Type of Home: House Home Access: Stairs to enter;Ramped entrance Entrance Stairs-Rails: Left Entrance Stairs-Number of Steps: 6   Home Layout: One level Home Equipment: Cane - single point;Rollator (4  wheels);Shower seat      Prior Function Prior Level of Function : Independent/Modified Independent;Driving             Mobility Comments: mod I with use of rollator in home and community ADLs Comments: mod I for all ADLs, self care tasks and IADLs     Extremity/Trunk Assessment        Lower Extremity Assessment Lower Extremity Assessment: LLE deficits/detail LLE Deficits / Details: ankle DF/PF 5/5;SLR < 10 degree lag LLE Sensation: WNL    Cervical / Trunk Assessment Cervical / Trunk Assessment:  (slight head forward)  Communication   Communication Communication: No apparent difficulties  Cognition Arousal: Alert Behavior During Therapy: WFL for tasks assessed/performed Overall Cognitive Status: Within Functional Limits for tasks assessed                                          General Comments General comments (skin integrity, edema, etc.): B UE tremors R > L    Exercises Total Joint Exercises Ankle Circles/Pumps: AROM, Both, 10 reps   Assessment/Plan    PT Assessment Patient needs continued PT services  PT Problem List Decreased strength;Decreased range of motion;Decreased activity tolerance;Decreased balance;Decreased mobility;Decreased coordination;Pain       PT Treatment Interventions DME instruction;Gait training;Functional mobility training;Therapeutic activities;Therapeutic exercise;Balance training;Neuromuscular re-education;Patient/family education;Modalities    PT Goals (Current goals can be found in the Care Plan section)  Acute Rehab PT Goals Patient Stated Goal: to be able to walk PT Goal Formulation: With patient Time For Goal Achievement: 10/16/23 Potential to Achieve Goals: Good    Frequency 7X/week     Co-evaluation               AM-PAC PT "6 Clicks" Mobility  Outcome Measure Help needed turning from your back to your side while in a flat bed without using bedrails?: A Little Help needed moving from lying on  your back to sitting on the side of a flat bed without using bedrails?: A Little Help needed moving to and from a bed to a chair (including a wheelchair)?: A Little Help needed standing up from a chair using your arms (e.g., wheelchair or bedside chair)?: A Little Help needed to walk in hospital room?: A Little Help needed climbing 3-5 steps with a railing? : A Lot 6 Click Score: 17    End of Session Equipment Utilized During Treatment: Gait belt Activity Tolerance: Patient tolerated treatment well;No increased pain Patient left: in chair;with call bell/phone within reach;with chair alarm set;with nursing/sitter in room Nurse Communication: Mobility status PT Visit Diagnosis: Unsteadiness on feet (R26.81);Other abnormalities of gait and mobility (R26.89);Muscle weakness (generalized) (M62.81);Difficulty in walking, not elsewhere classified (R26.2);Pain Pain - Right/Left: Left Pain - part of body: Knee;Leg    Time: 5784-6962 PT Time Calculation (min) (ACUTE ONLY): 24 min   Charges:   PT Evaluation $PT Eval Low Complexity: 1 Low PT Treatments $Gait Training: 8-22 mins PT General Charges $$ ACUTE PT VISIT: 1 Visit         Johnny Bridge, PT Acute Rehab   Jacqualyn Posey 10/02/2023,  6:18 PM

## 2023-10-02 NOTE — Anesthesia Procedure Notes (Addendum)
Anesthesia Regional Block: Adductor canal block   Pre-Anesthetic Checklist: , timeout performed,  Correct Patient, Correct Site, Correct Laterality,  Correct Procedure, Correct Position, site marked,  Risks and benefits discussed,  Surgical consent,  Pre-op evaluation,  At surgeon's request and post-op pain management  Laterality: Lower and Left  Prep: chloraprep       Needles:  Injection technique: Single-shot  Needle Type: Echogenic Needle     Needle Length: 9cm  Needle Gauge: 22     Additional Needles:   Procedures:,,,, ultrasound used (permanent image in chart),,    Narrative:  Start time: 10/02/2023 7:34 AM End time: 10/02/2023 7:41 AM Injection made incrementally with aspirations every 5 mL.  Events: blood aspirated  Performed by: With CRNAs  Anesthesiologist: [name], MD  Additional Notes: Block assessed prior to surgery. Pt tolerated procedure well.

## 2023-10-02 NOTE — H&P (Signed)
Julie Carey is an 72 y.o. female.   Chief Complaint: left knee pain HPI: Patient is here for her H&P. Patient is scheduled for a LTKR by Dr. Shelle Iron at [facility] on 10/02/23.  Dr. Shelle Iron and the patient mutually agreed to proceed with a total knee replacement. Risks and benefits of the procedure were discussed including stiffness, suboptimal range of motion, persistent pain, infection requiring removal of prosthesis and reinsertion, need for prophylactic antibiotics in the future, for example, dental procedures, possible need for manipulation, revision in the future and also anesthetic complications including DVT, PE, etc. We discussed the perioperative course, time in the hospital, postoperative recovery and the need for elevation to control swelling. We also discussed the predicted range of motion and the probability that squatting and kneeling would be unobtainable in the future. In addition, postoperative anticoagulation was discussed. We have obtained preoperative medical clearance as necessary. Provided illustrated handout and discussed it in detail. They will enroll in the total joint replacement educational forum at the hospital.  She has been cleared and is to stop her Plavix 5 days preop. She does already have a walker, commode chair, shower chair.  Past Medical History:  Diagnosis Date   Anxiety    Arthritis    Cancer (HCC)    Melanoma on back of left leg   Coronary artery disease    Family history of heart disease    Hyperlipidemia    Hypertension    Hypothyroidism    Myocardial infarction (HCC)    Neuromuscular disorder (HCC)    Parkinson's tremors etc   Parkinson disease (HCC)    PONV (postoperative nausea and vomiting)     Past Surgical History:  Procedure Laterality Date   ABDOMINAL HYSTERECTOMY     CATARACT EXTRACTION, BILATERAL     CORONARY ANGIOPLASTY WITH STENT PLACEMENT  10/24/2010   mid circ.AV groove 80%narrowing, 205X29mm Promus Element stent , entire  region reduced to 0%   CORONARY STENT INTERVENTION N/A 11/13/2022   Procedure: CORONARY STENT INTERVENTION;  Surgeon: Swaziland, Peter M, MD;  Location: [facility] INVASIVE CV LAB;  Service: Cardiovascular;  Laterality: N/A;   KNEE ARTHROSCOPY W/ DEBRIDEMENT Right    LEFT HEART CATH AND CORONARY ANGIOGRAPHY N/A 11/13/2022   Procedure: LEFT HEART CATH AND CORONARY ANGIOGRAPHY;  Surgeon: Swaziland, Peter M, MD;  Location: [facility] INVASIVE CV LAB;  Service: Cardiovascular;  Laterality: N/A;   NM MYOCAR PERF WALL MOTION  12/27/2010   protocol:Bruce, EF 74%,no evidence of ischemia, exercise cap. 7 METS   right fracture elbow surgery      TRANSTHORACIC ECHOCARDIOGRAM  10/23/2010   Ef 50-55%,all cavity sizes normal, wall thickness normal, no regurg. n    Family History  Problem Relation Age of Onset   Heart failure Father    Hypertension Mother    Stroke Mother    Hydrocephalus Brother    Social History:  reports that she has never smoked. She has never used smokeless tobacco. She reports that she does not drink alcohol and does not use drugs.  Allergies:  Allergies  Allergen Reactions   Rosuvastatin     Joint pain on 40mg  daily    Current meds: aspirin 81 mg tablet,delayed release atorvastatin 80 mg table carbidopa 25 mg-levodopa 100 mg tablet carbidopa ER 25 mg-levodopa 100 mg tablet,extended release citalopram 10 mg tablet clopidogreL 75 mg tablet HYDROcodone 5 mg-acetaminophen 325 mg tablet levothyroxine 75 mcg tablet lisinopriL 10 mg tablet LORazepam 0.5 mg tablet metoprolol tartrate 25 mg tablet  nitroglycerin 0.4 mg sublingual tablet pramipexole 0.5 mg tablet  Review of Systems  Constitutional: Negative.   HENT: Negative.    Eyes: Negative.   Respiratory: Negative.    Cardiovascular: Negative.   Gastrointestinal: Negative.   Endocrine: Negative.   Genitourinary: Negative.   Musculoskeletal:  Positive for arthralgias, gait problem and joint swelling.  Skin: Negative.    Hematological: Negative.   Psychiatric/Behavioral: Negative.      There were no vitals taken for this visit. Physical Exam Constitutional:      Appearance: Normal appearance.  HENT:     Head: Normocephalic and atraumatic.     Right Ear: External ear normal.     Left Ear: External ear normal.     Nose: Nose normal.     Mouth/Throat:     Pharynx: Oropharynx is clear.  Eyes:     Conjunctiva/sclera: Conjunctivae normal.  Cardiovascular:     Rate and Rhythm: Normal rate.     Pulses: Normal pulses.  Pulmonary:     Effort: Pulmonary effort is normal.  Abdominal:     General: Bowel sounds are normal.  Musculoskeletal:     Cervical back: Normal range of motion.     Comments: Left knee tender medial joint line patellofemoral pain compression ranges 0-90. No DVT ipsilateral hip and ankle exam is unremarkable. Antalgic gait.  Skin:    General: Skin is warm and dry.  Neurological:     Mental Status: She is alert.    Prior x-rays reviewed with end-stage medial joint space narrowing bilateral knees with a varus deformity.  Assessment/Plan Impression: End-stage left knee osteoarthritis  Plan: Pt with end-stage left knee DJD, bone-on-bone, refractory to conservative tx, scheduled for left total knee replacement by Dr. Shelle Iron on December 11. We again discussed the procedure itself as well as risks, complications and alternatives, including but not limited to DVT, PE, infx, bleeding, failure of procedure, need for secondary procedure including manipulation, nerve injury, ongoing pain/symptoms, anesthesia risk, even stroke or death. Also discussed typical post-op protocols, activity restrictions, need for PT, flexion/extension exercises, time out of work. Discussed need for DVT ppx post-op per protocol. Discussed dental ppx and infx prevention. Also discussed limitations post-operatively such as kneeling and squatting. All questions were answered. Patient desires to proceed with surgery as  scheduled.  Will hold supplements, ASA and NSAIDs accordingly. Will remain NPO after midnight the night before surgery. Will present to [facility] for pre-op testing. Anticipate hospital stay to include at least 2 midnights given medical history and to ensure proper pain control. Plan aspirin for DVT ppx post-op, resume Plavix. Plan oxycodone, tizanidine, Colace, Miralax. Plan home with HHPT post-op with family members at home for assistance. Will follow up 10-14 days post-op for suture removal and xrays.  Plan Left total knee replacement  Dorothy Spark, PA-C for Dr Shelle Iron 10/02/2023, 8:09 AM

## 2023-10-02 NOTE — Op Note (Signed)
NAME: Julie Carey, Julie Carey MEDICAL RECORD NO: [id number] ACCOUNT NO: [id number] DATE OF BIRTH: 1951/02/14 FACILITY: [facility] LOCATION: [facility] PHYSICIAN: [name], MD  Operative Report   DATE OF PROCEDURE: 10/02/2023  PREOPERATIVE DIAGNOSIS:  End-stage osteoarthrosis, varus deformity of the left knee.  POSTOPERATIVE DIAGNOSIS:  End-stage osteoarthrosis, varus deformity of the left knee.  PROCEDURE PERFORMED:  Left total knee arthroplasty utilizing Attune DePuy rotating platform, 4 femur, 4 tibia, 10 mm insert, 32 patella.  ANESTHESIA:  Spinal.  ASSISTANT:  Orland Penman, PA.  HISTORY:  A 72 year old with end-stage osteoarthrosis, medial compartment, varus deformity, refractory to conservative treatment, severe pain and instability with ambulation indicated for replacement of the degenerated joint.  Risks and benefits  discussed including bleeding, infection, damage to neurovascular structures, no change in symptoms, worsening symptoms, DVT, PE, anesthetic complications, etc.  DESCRIPTION OF PROCEDURE:  The patient was placed in the supine position after induction of adequate spinal anesthesia, 2 g of Kefzol the left lower extremity was prepped, draped and exsanguinated in the usual sterile fashion.  Thigh tourniquet was  inflated to 225 mmHg.  The patient had significant varus deformity.  He made a midline incision centered over the knee.  Full-thickness flaps developed, elevated soft tissues medially, preserving the MCL.  Fat pad was debrided.  The patella was gently  everted.  Knee was flexed.  Tricompartmental osteoarthrosis was noted, bone-on-bone of medial and lateral compartments in the patellofemoral joint.  Leksell rongeur was utilized to remove multiple osteophytes.  I also did a synovectomy.  Next, Leksell  rongeur was utilized to remove the remnants of the medial and lateral meniscus and ACL.  A notch was placed above the femoral notch, a starting hole for the femoral drill,  which was drilled in line with the femur, entering the femoral canal, irrigated,  used a T-handle confirming the intramedullary canal and then irrigation.  Intramedullary guide 5-degree left with 9 off the distal femur.  This was pinned.  I performed a distal femoral cut.  I then subluxed the tibia.  Sclerosis was noted  posteromedially.  Using an external alignment guide with 9 off the high side, which was lateral bisecting the tibiotalar joint, 3-degree slope parallel to the shaft we performed our proximal cut.  Just capturing the sclerosis of the posteromedial tibial  plateau.  Following this, we tried our extension gap.  It was then an 8 slight recurvatum.  I then flexed the knee again and then sized the femur off the anterior cortex.  3 degrees of external rotation.  This was then pinned.  Reassessment, this was  felt to be slightly internally rotated, removed the pins and then repositioned slightly hypoplastic lateral femoral condyle.  I dilated to a 5 to increase the external rotation.  This was then pinned.  I felt this was coplanar with the transepicondylar  axis and Whiteside's line perpendicular to that in the anterior cortex of the distal femur.  This was pinned and sized off the anterior cortex to a 4.  I then placed a distal femoral block.  I performed an anterior, posterior and chamfer cuts.  With a  typical baby grand piano cut off the anterior cortex of the distal femur more laterally than medially.  Following this, I subluxed the tibia and measured the proximal tibia to a 4 just to the medial aspect of the tibial tubercle.  This was then pinned,  harvested bone centrally, and impacted into the distal femur.  Drilled centrally and then put our  punch guide without difficulty.  I then reduced the knee and felt we had better with an 8 insert.  Minimal recurvatum.  I everted the patella and measured  it to a 22 planned it to a 15.  Utilizing the external patellar jig.  I used a paddle, with the  paddle parallel to the joint surface.  I drilled her peg holes, placed a 32 patella trial, reduced it.  Had good patellofemoral tracking slightly tight  laterally.  I performed extensive lateral retinacular release.  The patellofemoral tracking then was satisfactory.  This was then removed.  All instrumentation was removed.  Checked posteriorly any remnants of menisci were excised, cauterized the  geniculates.  Capsule in the popliteus was intact.  Pulsatile lavage was used to clean all bony surfaces.  Exparel was placed in the medial and lateral menisci and through the posterior capsule aspirating first without a breaking the vacuum and injecting  10 mL.  It was then flexed after copious irrigation with pulsatile lavage.  Cement was mixed on the back table under vacuum.  Knee was flexed.  All surfaces thoroughly dried.  I used cement and digitally pressurizing in the proximal tibia.  I did not  drill bone holes in the posterior medial sclerotic bone to avoid fracture of the proximal tibia.  Cement was placed on the insert.  It was impacted into place, redundant cement removed.  Cement was placed on the femoral component and the femur, impacted  into place, fit flush, redundant cement removed, placed a 10-mm insert, reduced it to full extension, had an axial load throughout the curing of the cement.  Cement clamped the patella.  Redundant cement removed.  I placed Marcaine with epinephrine in  the wound and Prontosan and covered the wound throughout the tourniquet of the cement.  The tourniquet was deflated at 74 minutes.  Any minor bleeding was cauterized.  Next, I removed the insert after assessment of stability.  Had excellent  patellofemoral tracking.  Good stability to varus and valgus stress at 0 and 30 degrees.  Negative anterior drawer.  I then copiously irrigated with pulsatile lavage with Prontosan.  A permanent 10-mm polyethylene insert was placed and reduced.   Following this in mid flexion, I  reapproximated the patellar arthrotomy with #1 Vicryl interrupted figure-of-eight sutures followed by reinforcement of the running Stratafix.  Following this had excellent patellofemoral tracking, full flexion and full  extension.  Good stability to varus and valgus stress at 0 and 30 degrees.  Copious irrigation, pulsatile lavage with Prontosan, subcutaneous with 2-0 and skin with subcuticular Prolene.  Sterile dressing applied, placed in an immobilizer and transported  to the recovery room in satisfactory condition.  The patient tolerated the procedure well.  No complications.    Assistant, Orland Penman, PA was used throughout the case for patient positioning and gentle traction and closure.  BLOOD LOSS: 150 mL.   PUS D: 10/02/2023 11:03:55 am T: 10/02/2023 11:35:00 am  JOB: [id number]/ [id number]

## 2023-10-02 NOTE — Discharge Instructions (Signed)
Elevate leg above heart 6x a day for each Use knee immobilizer while walking until can SLR x 10 Use knee immobilizer in bed to keep knee in extension Aquacel dressing may remain in place for one week. May shower with aquacel dressing in place. If the dressing becomes saturated or peels off before a week, you may remove aquacel dressing, otherwise remove at one week. Do not remove steri-strips if they are present. Place new dressing with gauze and tape or ACE bandage which should be kept clean and dry and changed daily.  INSTRUCTIONS AFTER JOINT REPLACEMENT   Remove items at home which could result in a fall. This includes throw rugs or furniture in walking pathways ICE to the affected joint every three hours while awake for 30 minutes at a time, for at least the first 3-5 days, and then as needed for pain and swelling.  Continue to use ice for pain and swelling. You may notice swelling that will progress down to the foot and ankle.  This is normal after surgery.  Elevate your leg when you are not up walking on it.   Continue to use the breathing machine you got in the hospital (incentive spirometer) which will help keep your temperature down.  It is common for your temperature to cycle up and down following surgery, especially at night when you are not up moving around and exerting yourself.  The breathing machine keeps your lungs expanded and your temperature down.   DIET:  As you were doing prior to hospitalization, we recommend a well-balanced diet.  DRESSING / WOUND CARE / SHOWERING  You may remove your dressing 1 week after surgery.  Then change the dressing every day with sterile gauze.  Please use good hand washing techniques before changing the dressing.  Do not use any lotions or creams on the incision until instructed by your surgeon.  ACTIVITY  Increase activity slowly as tolerated, but follow the weight bearing instructions below.   No driving for 6 weeks or until further  direction given by your physician.  You cannot drive while taking narcotics.  No lifting or carrying greater than 10 lbs. until further directed by your surgeon. Avoid periods of inactivity such as sitting longer than an hour when not asleep. This helps prevent blood clots.  You may return to work once you are authorized by your doctor.     WEIGHT BEARING   Weight bearing as tolerated with assist device (walker, cane, etc) as directed, use it as long as suggested by your surgeon or therapist, typically at least 4-6 weeks.   EXERCISES  Results after joint replacement surgery are often greatly improved when you follow the exercise, range of motion and muscle strengthening exercises prescribed by your doctor. Safety measures are also important to protect the joint from further injury. Any time any of these exercises cause you to have increased pain or swelling, decrease what you are doing until you are comfortable again and then slowly increase them. If you have problems or questions, call your caregiver or physical therapist for advice.   Rehabilitation is important following a joint replacement. After just a few days of immobilization, the muscles of the leg can become weakened and shrink (atrophy).  These exercises are designed to build up the tone and strength of the thigh and leg muscles and to improve motion. Often times heat used for twenty to thirty minutes before working out will loosen up your tissues and help with improving the range  of motion but do not use heat for the first two weeks following surgery (sometimes heat can increase post-operative swelling).   These exercises can be done on a training (exercise) mat, on the floor, on a table or on a bed. Use whatever works the best and is most comfortable for you.    Use music or television while you are exercising so that the exercises are a pleasant break in your day. This will make your life better with the exercises acting as a break in  your routine that you can look forward to.   Perform all exercises about fifteen times, three times per day or as directed.  You should exercise both the operative leg and the other leg as well.  Exercises include:   Quad Sets - Tighten up the muscle on the front of the thigh (Quad) and hold for 5-10 seconds.   Straight Leg Raises - With your knee straight (if you were given a brace, keep it on), lift the leg to 60 degrees, hold for 3 seconds, and slowly lower the leg.  Perform this exercise against resistance later as your leg gets stronger.  Leg Slides: Lying on your back, slowly slide your foot toward your buttocks, bending your knee up off the floor (only go as far as is comfortable). Then slowly slide your foot back down until your leg is flat on the floor again.  Angel Wings: Lying on your back spread your legs to the side as far apart as you can without causing discomfort.  Hamstring Strength:  Lying on your back, push your heel against the floor with your leg straight by tightening up the muscles of your buttocks.  Repeat, but this time bend your knee to a comfortable angle, and push your heel against the floor.  You may put a pillow under the heel to make it more comfortable if necessary.   A rehabilitation program following joint replacement surgery can speed recovery and prevent re-injury in the future due to weakened muscles. Contact your doctor or a physical therapist for more information on knee rehabilitation.    CONSTIPATION  Constipation is defined medically as fewer than three stools per week and severe constipation as less than one stool per week.  Even if you have a regular bowel pattern at home, your normal regimen is likely to be disrupted due to multiple reasons following surgery.  Combination of anesthesia, postoperative narcotics, change in appetite and fluid intake all can affect your bowels.   YOU MUST use at least one of the following options; they are listed in order of  increasing strength to get the job done.  They are all available over the counter, and you may need to use some, POSSIBLY even all of these options:    Drink plenty of fluids (prune juice may be helpful) and high fiber foods Colace 100 mg by mouth twice a day  Senokot for constipation as directed and as needed Dulcolax (bisacodyl), take with full glass of water  Miralax (polyethylene glycol) once or twice a day as needed.  If you have tried all these things and are unable to have a bowel movement in the first 3-4 days after surgery call either your surgeon or your primary doctor.    If you experience loose stools or diarrhea, hold the medications until you stool forms back up.  If your symptoms do not get better within 1 week or if they get worse, check with your doctor.  If you experience "  the worst abdominal pain ever" or develop nausea or vomiting, please contact the office immediately for further recommendations for treatment.   ITCHING:  If you experience itching with your medications, try taking only a single pain pill, or even half a pain pill at a time.  You can also use Benadryl over the counter for itching or also to help with sleep.   TED HOSE STOCKINGS:  Use stockings on both legs until for at least 2 weeks or as directed by physician office. They may be removed at night for sleeping.  MEDICATIONS:  See your medication summary on the "After Visit Summary" that nursing will review with you.  You may have some home medications which will be placed on hold until you complete the course of blood thinner medication.  It is important for you to complete the blood thinner medication as prescribed.  PRECAUTIONS:  If you experience chest pain or shortness of breath - call 911 immediately for transfer to the hospital emergency department.   If you develop a fever greater that 101 F, purulent drainage from wound, increased redness or drainage from wound, foul odor from the wound/dressing, or  calf pain - CONTACT YOUR SURGEON.                                                   FOLLOW-UP APPOINTMENTS:  If you do not already have a post-op appointment, please call the office for an appointment to be seen by your surgeon.  Guidelines for how soon to be seen are listed in your "After Visit Summary", but are typically between 1-4 weeks after surgery.  OTHER INSTRUCTIONS:   Knee Replacement:  Do not place pillow under knee, focus on keeping the knee straight while resting. CPM instructions: 0-90 degrees, 2 hours in the morning, 2 hours in the afternoon, and 2 hours in the evening. Place foam block, curve side up under heel at all times except when in CPM or when walking.  DO NOT modify, tear, cut, or change the foam block in any way.  POST-OPERATIVE OPIOID TAPER INSTRUCTIONS: It is important to wean off of your opioid medication as soon as possible. If you do not need pain medication after your surgery it is ok to stop day one. Opioids include: Codeine, Hydrocodone(Norco, Vicodin), Oxycodone(Percocet, oxycontin) and hydromorphone amongst others.  Long term and even short term use of opiods can cause: Increased pain response Dependence Constipation Depression Respiratory depression And more.  Withdrawal symptoms can include Flu like symptoms Nausea, vomiting And more Techniques to manage these symptoms Hydrate well Eat regular healthy meals Stay active Use relaxation techniques(deep breathing, meditating, yoga) Do Not substitute Alcohol to help with tapering If you have been on opioids for less than two weeks and do not have pain than it is ok to stop all together.  Plan to wean off of opioids This plan should start within one week post op of your joint replacement. Maintain the same interval or time between taking each dose and first decrease the dose.  Cut the total daily intake of opioids by one tablet each day Next start to increase the time between doses. The last dose that  should be eliminated is the evening dose.   MAKE SURE YOU:  Understand these instructions.  Get help right away if you are not doing well or get  worse.    Thank you for letting us be a part of your medical care team.  It is a privilege we respect greatly.  We hope these instructions will help you stay on track for a fast and full recovery!

## 2023-10-02 NOTE — Transfer of Care (Signed)
Immediate Anesthesia Transfer of Care Note  Patient: Julie Carey  Procedure(s) Performed: TOTAL KNEE ARTHROPLASTY (Left: Knee)  Patient Location: PACU  Anesthesia Type:MAC and Spinal  Level of Consciousness: awake and alert   Airway & Oxygen Therapy: Patient Spontanous Breathing and Patient connected to face mask oxygen  Post-op Assessment: Report given to RN and Post -op Vital signs reviewed and stable  Post vital signs: Reviewed and stable  Last Vitals:  Vitals Value Taken Time  BP 110/55 10/02/23 1120  Temp    Pulse 69 10/02/23 1122  Resp 18 10/02/23 1122  SpO2 100 % 10/02/23 1122  Vitals shown include unfiled device data.  Last Pain:  Vitals:   10/02/23 0728  TempSrc:   PainSc: 0-No pain         Complications: No notable events documented.

## 2023-10-03 ENCOUNTER — Encounter (HOSPITAL_COMMUNITY): Payer: Self-pay | Admitting: Specialist

## 2023-10-03 DIAGNOSIS — M170 Bilateral primary osteoarthritis of knee: Secondary | ICD-10-CM | POA: Diagnosis not present

## 2023-10-03 DIAGNOSIS — M1712 Unilateral primary osteoarthritis, left knee: Secondary | ICD-10-CM | POA: Diagnosis not present

## 2023-10-03 LAB — CBC
HCT: 32.6 % — ABNORMAL LOW (ref 36.0–46.0)
Hemoglobin: 11.2 g/dL — ABNORMAL LOW (ref 12.0–15.0)
MCH: 32 pg (ref 26.0–34.0)
MCHC: 34.4 g/dL (ref 30.0–36.0)
MCV: 93.1 fL (ref 80.0–100.0)
Platelets: 273 10*3/uL (ref 150–400)
RBC: 3.5 MIL/uL — ABNORMAL LOW (ref 3.87–5.11)
RDW: 12.7 % (ref 11.5–15.5)
WBC: 10.3 10*3/uL (ref 4.0–10.5)
nRBC: 0 % (ref 0.0–0.2)

## 2023-10-03 LAB — BASIC METABOLIC PANEL
Anion gap: 6 (ref 5–15)
BUN: 18 mg/dL (ref 8–23)
CO2: 24 mmol/L (ref 22–32)
Calcium: 8.6 mg/dL — ABNORMAL LOW (ref 8.9–10.3)
Chloride: 106 mmol/L (ref 98–111)
Creatinine, Ser: 0.59 mg/dL (ref 0.44–1.00)
GFR, Estimated: >60 mL/min (ref >60)
Glucose, Bld: 154 mg/dL — ABNORMAL HIGH (ref 70–99)
Potassium: 4.5 mmol/L (ref 3.5–5.1)
Sodium: 136 mmol/L (ref 135–145)

## 2023-10-03 MED ORDER — DOCUSATE SODIUM 100 MG PO CAPS: 100.0000 mg | ORAL_CAPSULE | Freq: Two times a day (BID) | ORAL | 2 refills | Status: DC

## 2023-10-03 MED ORDER — POLYETHYLENE GLYCOL 3350 17 G PO PACK: 17.0000 g | PACK | Freq: Every day | ORAL | 0 refills | Status: DC

## 2023-10-03 MED ORDER — ASPIRIN 81 MG PO TABS: 81.0000 mg | ORAL_TABLET | Freq: Two times a day (BID) | ORAL | 1 refills | Status: AC

## 2023-10-03 MED ORDER — OXYCODONE HCL 5 MG PO TABS: 5.0000 mg | ORAL_TABLET | ORAL | 0 refills | Status: DC | PRN

## 2023-10-03 NOTE — Progress Notes (Signed)
Physical Therapy Treatment Patient Details Name: Julie Carey MRN: [id number] DOB: August 22, 1951 Today's Date: 10/03/2023   History of Present Illness 72 yo female presents to therapy s/p L TKA on 10/02/2023 due to failure of conservative measures. [name], [name], [name], HTN, MI, [name]  Pt motivated, progressing well with mobility and hopeful for dc home later this date.  HEP initiated and pt up to ambulate increased distance in hall.    If plan is discharge home, recommend the following: A little help with walking and/or transfers;A little help with bathing/dressing/bathroom;Assistance with cooking/housework;Assist for transportation;Help with stairs or ramp for entrance   Can travel by private vehicle        Equipment Recommendations  Rolling walker (2 wheels)    Recommendations for Other Services       Precautions / Restrictions Precautions Precautions: Knee;Fall Restrictions Weight Bearing Restrictions Per Provider Order: No LLE Weight Bearing Per Provider Order: Weight bearing as tolerated     Mobility  Bed Mobility Overal bed mobility: Needs Assistance Bed Mobility: Supine to Sit     Supine to sit: Supervision, HOB elevated, Used rails     General bed mobility comments: Increased time with use of bed rail    Transfers Overall transfer level: Needs assistance Equipment used: Rolling walker (2 wheels) Transfers: Sit to/from Stand Sit to Stand: Contact guard assist           General transfer comment: min cues and push to stand    Ambulation/Gait Ambulation/Gait assistance: Contact guard assist Gait Distance (Feet): 100 Feet Assistive device: Rolling walker (2 wheels) Gait Pattern/deviations: Antalgic, Trunk flexed, Step-to pattern, Step-through pattern Gait velocity: decreased     General Gait Details: cues for posture, position from RW and initial sequence   Stairs              Wheelchair Mobility     Tilt Bed    Modified Rankin (Stroke Patients Only)       Balance Overall balance assessment: Needs assistance Sitting-balance support: Feet supported Sitting balance-Leahy Scale: Good     Standing balance support: Single extremity supported Standing balance-Leahy Scale: Fair                              Cognition Arousal: Alert Behavior During Therapy: WFL for tasks assessed/performed Overall Cognitive Status: Within Functional Limits for tasks assessed                                          Exercises Total Joint Exercises Ankle Circles/Pumps: AROM, Both, 15 reps, Supine Quad Sets: AROM, Both, 10 reps, Supine Heel Slides: AAROM, Left, 15 reps, Supine Straight Leg Raises: AAROM, AROM, Left, 15 reps, Supine    General Comments General comments (skin integrity, edema, etc.): Bil UE tremors      Pertinent Vitals/Pain Pain Assessment Pain Assessment: 0-10 Pain Score: 5  Pain Location: L knee and LE Pain Descriptors / Indicators: Aching, Dull, Grimacing, Operative site guarding, Constant Pain Intervention(s): Limited activity within patient's tolerance, Monitored during session, Premedicated before session, Ice applied    Home Living                          Prior Function  PT Goals (current goals can now be found in the care plan section) Acute Rehab PT Goals Patient Stated Goal: to be able to walk PT Goal Formulation: With patient Time For Goal Achievement: 10/16/23 Potential to Achieve Goals: Good Progress towards PT goals: Progressing toward goals    Frequency    7X/week      PT Plan      Co-evaluation              AM-PAC PT "6 Clicks" Mobility   Outcome Measure  Help needed turning from your back to your side while in a flat bed without using bedrails?: A Little Help needed moving from lying on your back to sitting on the side of a flat bed without  using bedrails?: A Little Help needed moving to and from a bed to a chair (including a wheelchair)?: A Little Help needed standing up from a chair using your arms (e.g., wheelchair or bedside chair)?: A Little Help needed to walk in hospital room?: A Little Help needed climbing 3-5 steps with a railing? : A Lot 6 Click Score: 17    End of Session Equipment Utilized During Treatment: Gait belt Activity Tolerance: Patient tolerated treatment well Patient left: in chair;with call bell/phone within reach;with chair alarm set Nurse Communication: Mobility status PT Visit Diagnosis: Unsteadiness on feet (R26.81);Other abnormalities of gait and mobility (R26.89);Muscle weakness (generalized) (M62.81);Difficulty in walking, not elsewhere classified (R26.2);Pain Pain - Right/Left: Left Pain - part of body: Knee;Leg     Time: 0825-0850 PT Time Calculation (min) (ACUTE ONLY): 25 min  Charges:    $Gait Training: 8-22 mins $Therapeutic Exercise: 8-22 mins PT General Charges $$ ACUTE PT VISIT: 1 Visit                     Julie Carey PT Acute Rehabilitation Services Pager [phone] Office [phone]    Julie Carey 10/03/2023, 8:56 AM

## 2023-10-03 NOTE — Progress Notes (Signed)
Subjective: 1 Day Post-Op Procedure(s) (LRB): TOTAL KNEE ARTHROPLASTY (Left) Patient reports pain as 4 on 0-10 scale.   Denies CP or SOB.  Voiding without difficulty. Positive flatus. Objective: Vital signs in last 24 hours: Temp:  [97.5 F (36.4 C)-98.8 F (37.1 C)] 97.7 F (36.5 C) (12/12 0547) Pulse Rate:  [58-73] 58 (12/12 0547) Resp:  [14-18] 17 (12/12 0547) BP: (105-129)/(55-78) 110/64 (12/12 0547) SpO2:  [93 %-100 %] 100 % (12/12 0547)  Intake/Output from previous day: 12/11 0701 - 12/12 0700 In: 2572.4 [P.O.:720; I.V.:1352.4; IV Piggyback:500] Out: 2600 [Urine:2400; Blood:200] Intake/Output this shift: No intake/output data recorded.  Recent Labs    10/03/23 0330  HGB 11.2*   Recent Labs    10/03/23 0330  WBC 10.3  RBC 3.50*  HCT 32.6*  PLT 273   Recent Labs    10/03/23 0330  NA 136  K 4.5  CL 106  CO2 24  BUN 18  CREATININE 0.59  GLUCOSE 154*  CALCIUM 8.6*   No results for input(s): "LABPT", "INR" in the last 72 hours.  Neurologically intact Neurovascular intact Sensation intact distally Intact pulses distally Dorsiflexion/Plantar flexion intact Incision: dressing C/D/I and no drainage Compartment soft  Assessment/Plan:  1 Day Post-Op Procedure(s) (LRB): TOTAL KNEE ARTHROPLASTY (Left) Advance diet Up with therapy Discharge home with home health   Principal Problem:   S/P TKR (total knee replacement) using cement      [name] 10/03/2023, @[facility] 

## 2023-10-03 NOTE — TOC Transition Note (Signed)
Transition of Care [facility]) - Discharge Note   Patient Details  Name: Julie Carey MRN: [id number] Date of Birth: 01/31/1951  Transition of Care [facility] CM/SW Contact:  Amada Jupiter, LCSW Phone Number: 10/03/2023, 10:01 AM   Clinical Narrative:     Met with pt and confirming need for RW (youth).  Pt has no DME agency preference - order placed with [employer] and item delivered to room.  HHPT already prearranged with [facility] via ortho office prior to surgery.  No further TOC needs.   Final next level of care: Home w Home Health Services Barriers to Discharge: No Barriers Identified   Patient Goals and CMS Choice Patient states their goals for this hospitalization and ongoing recovery are:: return home          Discharge Placement                       Discharge Plan and Services Additional resources added to the After Visit Summary for                  DME Arranged: Walker youth DME Agency: [employer] Date DME Agency Contacted: 10/03/23 Time DME Agency Contacted: 0930 Representative spoke with at DME Agency: Yvonna Alanis HH Arranged: PT HH Agency: [facility]        Social Drivers of Health (SDOH) Interventions SDOH Screenings   Food Insecurity: No Food Insecurity (10/02/2023)  Housing: Low Risk  (10/02/2023)  Transportation Needs: No Transportation Needs (10/02/2023)  Utilities: Not At Risk (10/02/2023)  Depression (PHQ2-9): Low Risk  (03/17/2020)  Tobacco Use: Low Risk  (10/02/2023)     Readmission Risk Interventions     No data to display

## 2023-10-03 NOTE — Progress Notes (Signed)
Physical Therapy Treatment Patient Details Name: Julie Carey MRN: [id number] DOB: Feb 12, 1951 Today's Date: 10/03/2023   History of Present Illness 72 yo female presents to therapy s/p L TKA on 10/02/2023 due to failure of conservative measures. [name], [name], [name], HTN, MI, [name]  Pt continues motivated and progressing well with mobility.  Pt performed HEP with written instruction provided and reviewed; ambulated in hall with improvement in stability and negotiated stairs.  Pt eager for dc home this date.    If plan is discharge home, recommend the following: A little help with walking and/or transfers;A little help with bathing/dressing/bathroom;Assistance with cooking/housework;Assist for transportation;Help with stairs or ramp for entrance   Can travel by private vehicle        Equipment Recommendations  Rolling walker (2 wheels)    Recommendations for Other Services       Precautions / Restrictions Precautions Precautions: Knee;Fall Restrictions Weight Bearing Restrictions Per Provider Order: No LLE Weight Bearing Per Provider Order: Weight bearing as tolerated     Mobility  Bed Mobility Overal bed mobility: Needs Assistance Bed Mobility: Supine to Sit     Supine to sit: Supervision, HOB elevated, Used rails     General bed mobility comments: UP in chair and returned to same    Transfers Overall transfer level: Needs assistance Equipment used: Rolling walker (2 wheels) Transfers: Sit to/from Stand Sit to Stand: Contact guard assist, Supervision           General transfer comment: min cues for LE management and use of UEs to self assist    Ambulation/Gait Ambulation/Gait assistance: Contact guard assist, Supervision Gait Distance (Feet): 100 Feet Assistive device: Rolling walker (2 wheels) Gait Pattern/deviations: Antalgic, Trunk flexed, Step-to pattern, Step-through  pattern Gait velocity: decreased     General Gait Details: cues for posture, position from RW and initial sequence   Stairs Stairs: Yes Stairs assistance: Contact guard assist Stair Management: Two rails, Step to pattern, Forwards Number of Stairs: 3 General stair comments: cues for sequence   Wheelchair Mobility     Tilt Bed    Modified Rankin (Stroke Patients Only)       Balance Overall balance assessment: Needs assistance Sitting-balance support: Feet supported Sitting balance-Leahy Scale: Good     Standing balance support: Single extremity supported Standing balance-Leahy Scale: Fair                              Cognition Arousal: Alert Behavior During Therapy: WFL for tasks assessed/performed Overall Cognitive Status: Within Functional Limits for tasks assessed                                          Exercises Total Joint Exercises Ankle Circles/Pumps: AROM, Both, 15 reps, Supine Quad Sets: AROM, Both, 10 reps, Supine Heel Slides: AAROM, Left, Supine, 10 reps Straight Leg Raises: AAROM, AROM, Left, Supine, 10 reps Long Arc Quad: AAROM, Left, 10 reps, Seated    General Comments General comments (skin integrity, edema, etc.): Bil UE tremors      Pertinent Vitals/Pain Pain Assessment Pain Assessment: 0-10 Pain Score: 5  Pain Location: L knee and LE Pain Descriptors / Indicators: Aching, Dull, Grimacing, Operative site guarding, Constant Pain Intervention(s): Limited activity within patient's tolerance,  Monitored during session, Premedicated before session, Ice applied    Home Living                          Prior Function            PT Goals (current goals can now be found in the care plan section) Acute Rehab PT Goals Patient Stated Goal: to be able to walk PT Goal Formulation: With patient Time For Goal Achievement: 10/16/23 Potential to Achieve Goals: Good Progress towards PT goals: Progressing  toward goals    Frequency    7X/week      PT Plan      Co-evaluation              AM-PAC PT "6 Clicks" Mobility   Outcome Measure  Help needed turning from your back to your side while in a flat bed without using bedrails?: A Little Help needed moving from lying on your back to sitting on the side of a flat bed without using bedrails?: A Little Help needed moving to and from a bed to a chair (including a wheelchair)?: A Little Help needed standing up from a chair using your arms (e.g., wheelchair or bedside chair)?: A Little Help needed to walk in hospital room?: A Little Help needed climbing 3-5 steps with a railing? : A Little 6 Click Score: 18    End of Session Equipment Utilized During Treatment: Gait belt Activity Tolerance: Patient tolerated treatment well Patient left: in chair;with call bell/phone within reach;with chair alarm set Nurse Communication: Mobility status PT Visit Diagnosis: Unsteadiness on feet (R26.81);Other abnormalities of gait and mobility (R26.89);Muscle weakness (generalized) (M62.81);Difficulty in walking, not elsewhere classified (R26.2);Pain Pain - Right/Left: Left Pain - part of body: Knee;Leg     Time: 1610-9604 PT Time Calculation (min) (ACUTE ONLY): 27 min  Charges:    $Gait Training: 8-22 mins $Therapeutic Exercise: 8-22 mins PT General Charges $$ ACUTE PT VISIT: 1 Visit                     Julie Carey PT Acute Rehabilitation Services Pager [phone] Office [phone]    Julie Carey 10/03/2023, 11:49 AM

## 2023-10-04 DIAGNOSIS — E785 Hyperlipidemia, unspecified: Secondary | ICD-10-CM | POA: Diagnosis not present

## 2023-10-04 DIAGNOSIS — I252 Old myocardial infarction: Secondary | ICD-10-CM | POA: Diagnosis not present

## 2023-10-04 DIAGNOSIS — Z471 Aftercare following joint replacement surgery: Secondary | ICD-10-CM | POA: Diagnosis not present

## 2023-10-04 DIAGNOSIS — I2510 Atherosclerotic heart disease of native coronary artery without angina pectoris: Secondary | ICD-10-CM | POA: Diagnosis not present

## 2023-10-04 DIAGNOSIS — I119 Hypertensive heart disease without heart failure: Secondary | ICD-10-CM | POA: Diagnosis not present

## 2023-10-04 DIAGNOSIS — G20A1 Parkinson's disease without dyskinesia, without mention of fluctuations: Secondary | ICD-10-CM | POA: Diagnosis not present

## 2023-10-04 DIAGNOSIS — F419 Anxiety disorder, unspecified: Secondary | ICD-10-CM | POA: Diagnosis not present

## 2023-10-04 DIAGNOSIS — Z96652 Presence of left artificial knee joint: Secondary | ICD-10-CM | POA: Diagnosis not present

## 2023-10-04 DIAGNOSIS — Z85820 Personal history of malignant melanoma of skin: Secondary | ICD-10-CM | POA: Diagnosis not present

## 2023-10-04 DIAGNOSIS — E039 Hypothyroidism, unspecified: Secondary | ICD-10-CM | POA: Diagnosis not present

## 2023-10-04 NOTE — Anesthesia Procedure Notes (Signed)
Spinal  Patient location during procedure: OR Start time: 10/02/2023 8:32 AM End time: 10/02/2023 8:38 AM Reason for block: surgical anesthesia Staffing Performed: anesthesiologist  Anesthesiologist: [name], MD Performed by: [name], MD Authorized by: [name], MD   Preanesthetic Checklist Completed: patient identified, IV checked, risks and benefits discussed, surgical consent, monitors and equipment checked, pre-op evaluation and timeout performed Spinal Block Patient position: sitting Prep: DuraPrep and site prepped and draped Patient monitoring: heart rate, cardiac monitor, continuous pulse ox and blood pressure Approach: midline Location: L3-4 Injection technique: single-shot Needle Needle type: Pencan  Needle gauge: 24 G Needle length: 10 cm Assessment Sensory level: T4 Events: CSF return Additional Notes  1 Attempt (s). Pt tolerated procedure well.

## 2023-10-04 NOTE — Addendum Note (Signed)
Addendum  created 10/04/23 1506 by [name], MD   Child order released for a procedure order, Clinical Note Signed, Intraprocedure Blocks edited, SmartForm saved

## 2023-10-15 DIAGNOSIS — Z96652 Presence of left artificial knee joint: Secondary | ICD-10-CM | POA: Diagnosis not present

## 2023-10-15 DIAGNOSIS — Z471 Aftercare following joint replacement surgery: Secondary | ICD-10-CM | POA: Diagnosis not present

## 2023-10-24 NOTE — Progress Notes (Signed)
 Assessment/Plan:   1.  Parkinsons Disease  -Continue carbidopa /levodopa  25/100 CR, 2 at 7am, 2 at 11 am, 1 at 4pm  -Continue pramipexole  0.5 mg 3 times per day.   2.  PD dyskinesia  -Better with CR version of levodopa .  Haven't seen dyskinesia since we changed  3.  Hypertension  -On multiple antihypertensives  -Follows with cardiology  -will need to watch this closely with course of time due to risk of Neurogenic Orthostatic Hypotension with Parkinsons Disease   4.  Right foot dystonia  -Was referred to Dr. Carilyn for possible Botox.  He recommended she try physical therapy first, but she did not follow back up with them.  Encouraged her to do so if dystonia sx's increase and she agreed, but seems to be stable for now.  5.  Left knee pain  -She had a total knee completed on December 11. Subjective:   Julie Carey was seen today in follow up for Parkinsons disease.  My previous records were reviewed prior to todays visit as well as outside records available to me.  She remains on levodopa  and pramipexole .  She is doing well with these medications.  She had a total knee done on December 11.  She is recovering from that.  She starts PT Monday.  She is using the walker now.  She has had no falls.    Current prescribed movement disorder medications: Carbidopa /levodopa  25/100 CR, 2/2/1 Pramipexole  0.5 mg 3 times per day.   PREVIOUS MEDICATIONS: carbidopa /levodopa  25/100 IR (changed because of dyskinesia)  ALLERGIES:   Allergies  Allergen Reactions   Rosuvastatin      Joint pain on 40mg  daily    CURRENT MEDICATIONS:  Outpatient Encounter Medications as of 10/25/2023  Medication Sig   aspirin  81 MG tablet Take 1 tablet (81 mg total) by mouth 2 (two) times daily after a meal.   atorvastatin  (LIPITOR) 80 MG tablet TAKE 1 TABLET(80 MG) BY MOUTH DAILY   B Complex-C (B-COMPLEX WITH VITAMIN C) tablet Take 1 tablet by mouth daily.   calcium  carbonate (OSCAL) 1500 (600 Ca) MG TABS  tablet Take 1 tablet by mouth daily with breakfast.   Carbidopa -Levodopa  ER (SINEMET  CR) 25-100 MG tablet controlled release TAKE 2 TABLETS BY MOUTH AT 7AM, 2 TABLETS AT 11AM, AND 1 TABLET BY MOUTH AT 4PM   citalopram  (CELEXA ) 10 MG tablet Take 10 mg by mouth at bedtime.   clopidogrel  (PLAVIX ) 75 MG tablet Take 1 tablet (75 mg total) by mouth daily.   docusate sodium  (COLACE) 100 MG capsule Take 1 capsule (100 mg total) by mouth 2 (two) times daily.   levothyroxine  (SYNTHROID , LEVOTHROID) 75 MCG tablet TAKE 1 TABLET BY MOUTH DAILY BEFORE BREAKFAST   lisinopril  (PRINIVIL ,ZESTRIL ) 10 MG tablet Take 1 tablet (10 mg total) by mouth daily.   LORazepam  (ATIVAN ) 0.5 MG tablet Take 0.5 mg by mouth daily as needed for anxiety.   metoprolol  tartrate (LOPRESSOR ) 25 MG tablet Take 25 mg by mouth 2 (two) times daily.   Multiple Vitamin (MULTIVITAMIN) tablet Take 1 tablet by mouth daily.   nitroGLYCERIN  (NITROSTAT ) 0.4 MG SL tablet Place 1 tablet (0.4 mg total) under the tongue every 5 (five) minutes as needed for chest pain (up to 3 doses. If taking 3rd dose, call 911).   oxyCODONE  (OXY IR/ROXICODONE ) 5 MG immediate release tablet Take 1 tablet (5 mg total) by mouth every 4 (four) hours as needed for severe pain (pain score 7-10).   polyethylene glycol (MIRALAX  /  GLYCOLAX ) 17 g packet Take 17 g by mouth daily.   pramipexole  (MIRAPEX ) 0.5 MG tablet TAKE 1 TABLET(0.5 MG) BY MOUTH THREE TIMES DAILY   No facility-administered encounter medications on file as of 10/25/2023.    Objective:   PHYSICAL EXAMINATION:    VITALS:   There were no vitals filed for this visit.   GEN:  The patient appears stated age and is in NAD. HEENT:  Normocephalic, atraumatic.  The mucous membranes are moist. The superficial temporal arteries are without ropiness or tenderness. CV:  RRR Lungs:  CTAB Neck/HEME:  There are no carotid bruits bilaterally.  Neurological examination:  Orientation: The patient is alert and oriented  x3. Cranial nerves: There is good facial symmetry with min facial hypomimia. The speech is fluent and clear. Soft palate rises symmetrically and there is no tongue deviation. Hearing is intact to conversational tone. Sensation: Sensation is intact to light touch throughout Motor: Strength is at least antigravity x4.  Movement examination: Tone: There is normal tone in the UE/LE Abnormal movements: rare tremor in the RUE Coordination:  There is no decremation, with any form of RAMS, including alternating supination and pronation of the forearm, hand opening and closing, finger taps, heel taps and toe taps Gait and Station: The patient pushes off to arise.  She has her walker today (recent knee sx)  I have reviewed and interpreted the following labs independently    Chemistry      Component Value Date/Time   NA 136 10/03/2023 0330   K 4.5 10/03/2023 0330   CL 106 10/03/2023 0330   CO2 24 10/03/2023 0330   BUN 18 10/03/2023 0330   CREATININE 0.59 10/03/2023 0330      Component Value Date/Time   CALCIUM  8.6 (L) 10/03/2023 0330   ALKPHOS 98 03/29/2023 1115   AST 20 03/29/2023 1115   ALT 18 03/29/2023 1115   BILITOT 0.8 03/29/2023 1115       Lab Results  Component Value Date   WBC 10.3 10/03/2023   HGB 11.2 (L) 10/03/2023   HCT 32.6 (L) 10/03/2023   MCV 93.1 10/03/2023   PLT 273 10/03/2023    Cc:  Claudene Pellet, MD

## 2023-10-25 ENCOUNTER — Encounter: Payer: Self-pay | Admitting: Neurology

## 2023-10-25 ENCOUNTER — Ambulatory Visit: Payer: PPO | Admitting: Neurology

## 2023-10-25 VITALS — BP 108/62 | HR 80 | Ht 62.0 in | Wt 202.0 lb

## 2023-10-25 DIAGNOSIS — G20A1 Parkinson's disease without dyskinesia, without mention of fluctuations: Secondary | ICD-10-CM

## 2023-10-25 NOTE — Patient Instructions (Signed)

## 2023-11-01 ENCOUNTER — Encounter: Payer: Self-pay | Admitting: Cardiovascular Disease

## 2023-11-16 ENCOUNTER — Other Ambulatory Visit: Payer: Self-pay | Admitting: Cardiovascular Disease

## 2023-11-16 ENCOUNTER — Other Ambulatory Visit: Payer: Self-pay | Admitting: Neurology

## 2023-11-16 ENCOUNTER — Other Ambulatory Visit: Payer: Self-pay | Admitting: Physician Assistant

## 2023-11-16 DIAGNOSIS — G20A1 Parkinson's disease without dyskinesia, without mention of fluctuations: Secondary | ICD-10-CM

## 2023-11-18 ENCOUNTER — Other Ambulatory Visit: Payer: Self-pay | Admitting: Neurology

## 2023-11-18 DIAGNOSIS — G20A1 Parkinson's disease without dyskinesia, without mention of fluctuations: Secondary | ICD-10-CM

## 2024-02-03 ENCOUNTER — Encounter: Payer: Self-pay | Admitting: Neurology

## 2024-02-13 ENCOUNTER — Other Ambulatory Visit: Payer: Self-pay | Admitting: Neurology

## 2024-02-13 DIAGNOSIS — G20A1 Parkinson's disease without dyskinesia, without mention of fluctuations: Secondary | ICD-10-CM

## 2024-02-20 DIAGNOSIS — D1801 Hemangioma of skin and subcutaneous tissue: Secondary | ICD-10-CM | POA: Diagnosis not present

## 2024-02-20 DIAGNOSIS — D225 Melanocytic nevi of trunk: Secondary | ICD-10-CM | POA: Diagnosis not present

## 2024-02-20 DIAGNOSIS — L821 Other seborrheic keratosis: Secondary | ICD-10-CM | POA: Diagnosis not present

## 2024-02-20 DIAGNOSIS — L812 Freckles: Secondary | ICD-10-CM | POA: Diagnosis not present

## 2024-02-20 DIAGNOSIS — Z85820 Personal history of malignant melanoma of skin: Secondary | ICD-10-CM | POA: Diagnosis not present

## 2024-02-21 DIAGNOSIS — Z1231 Encounter for screening mammogram for malignant neoplasm of breast: Secondary | ICD-10-CM | POA: Diagnosis not present

## 2024-03-31 ENCOUNTER — Other Ambulatory Visit (HOSPITAL_COMMUNITY): Payer: Self-pay

## 2024-03-31 DIAGNOSIS — F419 Anxiety disorder, unspecified: Secondary | ICD-10-CM | POA: Diagnosis not present

## 2024-03-31 DIAGNOSIS — Z7902 Long term (current) use of antithrombotics/antiplatelets: Secondary | ICD-10-CM | POA: Diagnosis not present

## 2024-03-31 DIAGNOSIS — Z955 Presence of coronary angioplasty implant and graft: Secondary | ICD-10-CM | POA: Diagnosis not present

## 2024-03-31 DIAGNOSIS — K219 Gastro-esophageal reflux disease without esophagitis: Secondary | ICD-10-CM | POA: Diagnosis not present

## 2024-03-31 DIAGNOSIS — I2510 Atherosclerotic heart disease of native coronary artery without angina pectoris: Secondary | ICD-10-CM | POA: Diagnosis not present

## 2024-03-31 DIAGNOSIS — E785 Hyperlipidemia, unspecified: Secondary | ICD-10-CM | POA: Diagnosis not present

## 2024-03-31 DIAGNOSIS — E039 Hypothyroidism, unspecified: Secondary | ICD-10-CM | POA: Diagnosis not present

## 2024-03-31 DIAGNOSIS — G20B1 Parkinson's disease with dyskinesia, without mention of fluctuations: Secondary | ICD-10-CM | POA: Diagnosis not present

## 2024-03-31 DIAGNOSIS — R609 Edema, unspecified: Secondary | ICD-10-CM | POA: Diagnosis not present

## 2024-03-31 DIAGNOSIS — I10 Essential (primary) hypertension: Secondary | ICD-10-CM | POA: Diagnosis not present

## 2024-03-31 MED ORDER — HYDROCHLOROTHIAZIDE 12.5 MG PO TABS
12.5000 mg | ORAL_TABLET | Freq: Every day | ORAL | 3 refills | Status: DC
  Filled 2024-03-31: qty 30, 30d supply, fill #0
  Filled 2024-04-28: qty 30, 30d supply, fill #1
  Filled 2024-05-27: qty 30, 30d supply, fill #2
  Filled 2024-07-01: qty 30, 30d supply, fill #3

## 2024-03-31 MED ORDER — ATORVASTATIN CALCIUM 80 MG PO TABS
80.0000 mg | ORAL_TABLET | Freq: Every day | ORAL | 1 refills | Status: DC
  Filled 2024-03-31 – 2024-04-28 (×2): qty 90, 90d supply, fill #0
  Filled 2024-07-01 – 2024-07-26 (×2): qty 90, 90d supply, fill #1

## 2024-03-31 MED ORDER — CLOPIDOGREL BISULFATE 75 MG PO TABS
75.0000 mg | ORAL_TABLET | Freq: Every day | ORAL | 1 refills | Status: AC
  Filled 2024-03-31 – 2024-07-26 (×3): qty 90, 90d supply, fill #0

## 2024-03-31 MED ORDER — CITALOPRAM HYDROBROMIDE 10 MG PO TABS
10.0000 mg | ORAL_TABLET | Freq: Every day | ORAL | 1 refills | Status: AC
  Filled 2024-03-31 – 2024-04-28 (×2): qty 90, 90d supply, fill #0
  Filled 2024-07-01 – 2024-07-26 (×2): qty 90, 90d supply, fill #1

## 2024-03-31 MED ORDER — LEVOTHYROXINE SODIUM 75 MCG PO TABS
75.0000 ug | ORAL_TABLET | Freq: Every day | ORAL | 2 refills | Status: AC
  Filled 2024-03-31 – 2024-07-01 (×3): qty 90, 90d supply, fill #0
  Filled 2024-07-26 – 2024-10-16 (×2): qty 90, 90d supply, fill #1

## 2024-03-31 MED ORDER — LISINOPRIL 10 MG PO TABS
10.0000 mg | ORAL_TABLET | Freq: Every day | ORAL | 1 refills | Status: AC
  Filled 2024-03-31 – 2024-07-01 (×2): qty 90, 90d supply, fill #0
  Filled 2024-10-16: qty 90, 90d supply, fill #1

## 2024-04-09 DIAGNOSIS — Z96652 Presence of left artificial knee joint: Secondary | ICD-10-CM | POA: Diagnosis not present

## 2024-04-09 DIAGNOSIS — Z471 Aftercare following joint replacement surgery: Secondary | ICD-10-CM | POA: Diagnosis not present

## 2024-04-23 ENCOUNTER — Ambulatory Visit: Payer: PPO | Admitting: Neurology

## 2024-04-28 ENCOUNTER — Other Ambulatory Visit: Payer: Self-pay

## 2024-04-28 ENCOUNTER — Other Ambulatory Visit (HOSPITAL_COMMUNITY): Payer: Self-pay

## 2024-04-30 ENCOUNTER — Other Ambulatory Visit (HOSPITAL_COMMUNITY): Payer: Self-pay

## 2024-05-13 NOTE — Progress Notes (Unsigned)
 Assessment/Plan:   1.  Parkinsons Disease  -Continue carbidopa /levodopa  25/100 CR, 2 at 7am, 2 at 11 am, 1 at 4pm.  She notices some sleepiness with the medication.  Told her we could trial Rytary , but ultimately she decided to stay with this medicine.  -Continue pramipexole  0.5 mg 3 times per day.  -We discussed that it used to be thought that levodopa  would increase risk of melanoma but now it is believed that Parkinsons itself likely increases risk of melanoma. she is to get regular skin checks.  She follows with Dr. Joshua, last being seen in May, 2025   2.  PD dyskinesia  -Better with CR version of levodopa .  Haven't seen dyskinesia since we changed  3.  Hypertension  -On multiple antihypertensives  -Follows with cardiology  -will need to watch this closely with course of time due to risk of Neurogenic Orthostatic Hypotension with Parkinsons Disease   4.  Right foot dystonia  -Was referred to Dr. Carilyn for possible Botox.  He recommended she try physical therapy first, but she did not follow back up with them.  Encouraged her to do so if dystonia sx's increase and she agreed, but seems to be stable for now.  5.  Left knee pain, improved  -She had a total knee completed on October 02, 2023. Subjective:   Julie Carey was seen today in follow up for Parkinsons disease.  My previous records were reviewed prior to todays visit as well as outside records available to me.  Patient continues on levodopa  and pramipexole .  She tolerates medication well, without side effects.  No hallucinations.  No sleep attacks.  She has had no falls.  No near syncope.  She has had some tremor.  She has recently started back to exercise.    Current prescribed movement disorder medications: Carbidopa /levodopa  25/100 CR, 2/2/1 Pramipexole  0.5 mg 3 times per day.   PREVIOUS MEDICATIONS: carbidopa /levodopa  25/100 IR (changed because of dyskinesia)  ALLERGIES:   Allergies  Allergen Reactions    Rosuvastatin      Joint pain on 40mg  daily    CURRENT MEDICATIONS:  Outpatient Encounter Medications as of 05/14/2024  Medication Sig   aspirin  81 MG tablet Take 1 tablet (81 mg total) by mouth 2 (two) times daily after a meal.   atorvastatin  (LIPITOR) 80 MG tablet TAKE 1 TABLET(80 MG) BY MOUTH DAILY   atorvastatin  (LIPITOR) 80 MG tablet Take 1 tablet (80 mg total) by mouth daily.   B Complex-C (B-COMPLEX WITH VITAMIN C) tablet Take 1 tablet by mouth daily.   calcium  carbonate (OSCAL) 1500 (600 Ca) MG TABS tablet Take 1 tablet by mouth daily with breakfast.   Carbidopa -Levodopa  ER (SINEMET  CR) 25-100 MG tablet controlled release TAKE 2 TABLETS BY MOUTH AT 7AM, 2 TABLETS AT 11AM, AND 1 TABLET AT 4PM   citalopram  (CELEXA ) 10 MG tablet Take 10 mg by mouth at bedtime.   citalopram  (CELEXA ) 10 MG tablet Take 1 tablet (10 mg total) by mouth daily.   clopidogrel  (PLAVIX ) 75 MG tablet TAKE 1 TABLET(75 MG) BY MOUTH DAILY   clopidogrel  (PLAVIX ) 75 MG tablet Take 1 tablet (75 mg total) by mouth daily.   hydrochlorothiazide  (HYDRODIURIL ) 12.5 MG tablet Take 1 tablet (12.5 mg total) by mouth daily.   levothyroxine  (SYNTHROID ) 75 MCG tablet Take 1 tablet by mouth in the morning on an empty stomach   levothyroxine  (SYNTHROID , LEVOTHROID) 75 MCG tablet TAKE 1 TABLET BY MOUTH DAILY BEFORE BREAKFAST   lisinopril  (  PRINIVIL ,ZESTRIL ) 10 MG tablet Take 1 tablet (10 mg total) by mouth daily.   lisinopril  (ZESTRIL ) 10 MG tablet Take 1 tablet (10 mg total) by mouth daily.   LORazepam  (ATIVAN ) 0.5 MG tablet Take 0.5 mg by mouth daily as needed for anxiety.   metoprolol  tartrate (LOPRESSOR ) 25 MG tablet TAKE 1 TABLET(25 MG) BY MOUTH TWICE DAILY   Multiple Vitamin (MULTIVITAMIN) tablet Take 1 tablet by mouth daily.   nitroGLYCERIN  (NITROSTAT ) 0.4 MG SL tablet Place 1 tablet (0.4 mg total) under the tongue every 5 (five) minutes as needed for chest pain (up to 3 doses. If taking 3rd dose, call 911).   pramipexole   (MIRAPEX ) 0.5 MG tablet TAKE 1 TABLET(0.5 MG) BY MOUTH THREE TIMES DAILY   [DISCONTINUED] HYDROcodone-acetaminophen  (NORCO/VICODIN) 5-325 MG tablet Take 1-2 tablets by mouth every 6 (six) hours as needed.   No facility-administered encounter medications on file as of 05/14/2024.    Objective:   PHYSICAL EXAMINATION:    VITALS:   Vitals:   05/14/24 1445  BP: 124/76  Pulse: 75  SpO2: 96%  Weight: 205 lb 6.4 oz (93.2 kg)  Height: 5' 2 (1.575 m)     GEN:  The patient appears stated age and is in NAD. HEENT:  Normocephalic, atraumatic.  The mucous membranes are moist. The superficial temporal arteries are without ropiness or tenderness. CV:  RRR Lungs:  CTAB Neck/HEME:  There are no carotid bruits bilaterally.  Neurological examination:  Orientation: The patient is alert and oriented x3. Cranial nerves: There is good facial symmetry with min facial hypomimia. The speech is fluent and clear. Soft palate rises symmetrically and there is no tongue deviation. Hearing is intact to conversational tone. Sensation: Sensation is intact to light touch throughout Motor: Strength is at least antigravity x4.  Movement examination: Tone: There is normal tone in the UE/LE Abnormal movements: no tremor except with ambulation, as below Coordination:  There is no decremation, with any form of RAMS, including alternating supination and pronation of the forearm, hand opening and closing, finger taps, heel taps and toe taps Gait and Station: The patient pushes off to arise.  She ambulates with short steps but doesn't shuffle.  She has bilateral UE rest tremor with ambulation  I have reviewed and interpreted the following labs independently    Chemistry      Component Value Date/Time   NA 136 10/03/2023 0330   K 4.5 10/03/2023 0330   CL 106 10/03/2023 0330   CO2 24 10/03/2023 0330   BUN 18 10/03/2023 0330   CREATININE 0.59 10/03/2023 0330      Component Value Date/Time   CALCIUM  8.6 (L)  10/03/2023 0330   ALKPHOS 98 03/29/2023 1115   AST 20 03/29/2023 1115   ALT 18 03/29/2023 1115   BILITOT 0.8 03/29/2023 1115       Lab Results  Component Value Date   WBC 10.3 10/03/2023   HGB 11.2 (L) 10/03/2023   HCT 32.6 (L) 10/03/2023   MCV 93.1 10/03/2023   PLT 273 10/03/2023   Total time spent on today's visit was 25 minutes, including both face-to-face time and nonface-to-face time.  Time included that spent on review of records (prior notes available to me/labs/imaging if pertinent), discussing treatment and goals, answering patient's questions and coordinating care.  Cc:  Claudene Pellet, MD

## 2024-05-14 ENCOUNTER — Ambulatory Visit: Admitting: Neurology

## 2024-05-14 ENCOUNTER — Other Ambulatory Visit (HOSPITAL_COMMUNITY): Payer: Self-pay

## 2024-05-14 ENCOUNTER — Encounter: Payer: Self-pay | Admitting: Neurology

## 2024-05-14 DIAGNOSIS — G20A1 Parkinson's disease without dyskinesia, without mention of fluctuations: Secondary | ICD-10-CM | POA: Diagnosis not present

## 2024-05-14 MED ORDER — CARBIDOPA-LEVODOPA ER 25-100 MG PO TBCR
EXTENDED_RELEASE_TABLET | ORAL | 2 refills | Status: AC
  Filled 2024-05-14 – 2024-05-27 (×2): qty 450, 90d supply, fill #0
  Filled 2024-09-21: qty 450, 90d supply, fill #1

## 2024-05-14 NOTE — Patient Instructions (Signed)

## 2024-05-15 ENCOUNTER — Other Ambulatory Visit (HOSPITAL_COMMUNITY): Payer: Self-pay

## 2024-05-15 ENCOUNTER — Encounter: Payer: Self-pay | Admitting: Neurology

## 2024-05-16 ENCOUNTER — Other Ambulatory Visit: Payer: Self-pay | Admitting: Neurology

## 2024-05-16 ENCOUNTER — Other Ambulatory Visit: Payer: Self-pay | Admitting: Cardiovascular Disease

## 2024-05-16 DIAGNOSIS — G20A1 Parkinson's disease without dyskinesia, without mention of fluctuations: Secondary | ICD-10-CM

## 2024-05-25 ENCOUNTER — Other Ambulatory Visit (HOSPITAL_COMMUNITY): Payer: Self-pay

## 2024-05-27 ENCOUNTER — Other Ambulatory Visit (HOSPITAL_COMMUNITY): Payer: Self-pay

## 2024-05-27 ENCOUNTER — Other Ambulatory Visit: Payer: Self-pay

## 2024-07-01 ENCOUNTER — Other Ambulatory Visit: Payer: Self-pay

## 2024-07-01 ENCOUNTER — Other Ambulatory Visit (HOSPITAL_COMMUNITY): Payer: Self-pay

## 2024-07-26 ENCOUNTER — Other Ambulatory Visit (HOSPITAL_COMMUNITY): Payer: Self-pay

## 2024-07-27 ENCOUNTER — Other Ambulatory Visit (HOSPITAL_COMMUNITY): Payer: Self-pay

## 2024-07-27 ENCOUNTER — Other Ambulatory Visit: Payer: Self-pay

## 2024-07-29 ENCOUNTER — Other Ambulatory Visit (HOSPITAL_COMMUNITY): Payer: Self-pay

## 2024-07-29 ENCOUNTER — Other Ambulatory Visit: Payer: Self-pay

## 2024-07-29 MED ORDER — HYDROCHLOROTHIAZIDE 12.5 MG PO TABS
12.5000 mg | ORAL_TABLET | Freq: Every day | ORAL | 3 refills | Status: AC
  Filled 2024-07-29 – 2024-08-14 (×2): qty 30, 30d supply, fill #0
  Filled 2024-09-05 – 2024-09-06 (×2): qty 30, 30d supply, fill #1
  Filled 2024-10-07 – 2024-10-19 (×2): qty 30, 30d supply, fill #2
  Filled 2024-11-16: qty 30, 30d supply, fill #3

## 2024-08-08 ENCOUNTER — Other Ambulatory Visit (HOSPITAL_COMMUNITY): Payer: Self-pay

## 2024-08-14 ENCOUNTER — Other Ambulatory Visit (HOSPITAL_COMMUNITY): Payer: Self-pay

## 2024-08-19 ENCOUNTER — Other Ambulatory Visit: Payer: Self-pay | Admitting: Cardiovascular Disease

## 2024-08-20 ENCOUNTER — Other Ambulatory Visit: Payer: Self-pay | Admitting: Cardiovascular Disease

## 2024-09-05 ENCOUNTER — Other Ambulatory Visit (HOSPITAL_COMMUNITY): Payer: Self-pay

## 2024-09-10 ENCOUNTER — Encounter: Payer: Self-pay | Admitting: Neurology

## 2024-09-18 ENCOUNTER — Other Ambulatory Visit: Payer: Self-pay | Admitting: Cardiovascular Disease

## 2024-09-21 ENCOUNTER — Other Ambulatory Visit: Payer: Self-pay

## 2024-09-22 ENCOUNTER — Other Ambulatory Visit: Payer: Self-pay

## 2024-09-28 ENCOUNTER — Ambulatory Visit: Attending: Cardiovascular Disease | Admitting: Cardiovascular Disease

## 2024-09-28 ENCOUNTER — Encounter: Payer: Self-pay | Admitting: Cardiovascular Disease

## 2024-09-28 VITALS — BP 128/66 | HR 68 | Ht 62.0 in | Wt 205.0 lb

## 2024-09-28 DIAGNOSIS — E782 Mixed hyperlipidemia: Secondary | ICD-10-CM

## 2024-09-28 DIAGNOSIS — I10 Essential (primary) hypertension: Secondary | ICD-10-CM

## 2024-09-28 DIAGNOSIS — I2510 Atherosclerotic heart disease of native coronary artery without angina pectoris: Secondary | ICD-10-CM | POA: Diagnosis not present

## 2024-09-28 NOTE — Assessment & Plan Note (Signed)
 History of CAD status post non-STEMI 10/24/2010 with stenting of her AV groove circumflex by Dr. Burnard with a drug-eluting stent.  She had moderate but noncritical disease in her LAD and RCA.  She had a non-STEMI 11/13/2022 and had Performed Dr. Jordan revealing a patent AV groove stent with high-grade mid dominant RCA stenosis which he stented with a 3 mm x 30 mm long Synergy drug-eluting stent with excellent result.  She had normal LV function.  She has had no recurrent symptoms and remains on DAPT with aspirin  and clopidogrel .

## 2024-09-28 NOTE — Assessment & Plan Note (Signed)
.    She is on hydrochlorothiazide , lisinopril  and metoprolol .

## 2024-09-28 NOTE — Patient Instructions (Signed)

## 2024-09-28 NOTE — Progress Notes (Signed)
 09/28/2024 Julie Carey [place]   1951/01/07  [id number]  Primary Physician Claudene Pellet, Carey Primary Cardiologist: Dorn Julie Carey FACP, [place], [place], FSCAI  HPI:  Julie Carey is a 73 y.o.   mildly overweight, married Caucasian female, mother of 3, grandmother to 5 grandchildren who I last saw in the office 03/20/2023.  She had a non-STEMI 10/24/2010 and ultimate stenting of her AV groove circumflex by Dr. Charlena Sor with a drug-eluting stent. She had moderate but noncritical disease in her LAD and RCA. Her risk factors include hyperlipidemia and family history. She had a negative Myoview December 27, 2010. She complains of labile hypertension and some palpitations; however, her blood pressure log over the last several months, after I increased her lisinopril , showed better control of her blood pressure. She did not increase her beta blocker as I had prescribed. Her lipid profile was excellent for secondary prevention and this will be rechecked.  Her primary care physician began her on Lexapro which improved some of her symptoms probably related to anxiety.    She had a recurrent non-STEMI 11/13/2022.  She underwent cardiac catheterization by Dr. Jordan revealing a patent AV groove circumflex stent with high-grade mid dominant RCA stenosis.  She had PCI and stenting using a 3 mm x 38 mm long Synergy drug-eluting stent with excellent result.  LV function was normal.  She was discharged home on aspirin  and clopidogrel .  Since I saw her a year and a half ago she has remained stable.  She has had no recurrent symptoms.  Unfortunately her husband has been in and out of the hospital with pneumonia and ileus as well as heart issues.   Current Meds  Medication Sig   aspirin  81 MG tablet Take 1 tablet (81 mg total) by mouth 2 (two) times daily after a meal.   atorvastatin  (LIPITOR) 80 MG tablet Take 1 tablet (80 mg total) by mouth daily.   calcium  carbonate (OSCAL) 1500 (600 Ca) MG TABS tablet Take 1 tablet  by mouth daily with breakfast.   Carbidopa -Levodopa  ER (SINEMET  CR) 25-100 MG tablet controlled release TAKE 2 TABLETS BY MOUTH AT 7AM, 2 TABLETS AT 11AM, AND 1 TABLET AT 4PM   citalopram  (CELEXA ) 10 MG tablet Take 1 tablet (10 mg total) by mouth daily.   clopidogrel  (PLAVIX ) 75 MG tablet TAKE 1 TABLET(75 MG) BY MOUTH DAILY   hydrochlorothiazide  (HYDRODIURIL ) 12.5 MG tablet Take 1 tablet (12.5 mg total) by mouth daily.   levothyroxine  (SYNTHROID ) 75 MCG tablet Take 1 tablet by mouth in the morning on an empty stomach   lisinopril  (ZESTRIL ) 10 MG tablet Take 1 tablet (10 mg total) by mouth daily.   LORazepam  (ATIVAN ) 0.5 MG tablet Take 0.5 mg by mouth daily as needed for anxiety.   metoprolol  tartrate (LOPRESSOR ) 25 MG tablet TAKE 1 TABLET(25 MG) BY MOUTH TWICE DAILY   Multiple Vitamin (MULTIVITAMIN) tablet Take 1 tablet by mouth daily.   nitroGLYCERIN  (NITROSTAT ) 0.4 MG SL tablet Place 1 tablet (0.4 mg total) under the tongue every 5 (five) minutes as needed for chest pain (up to 3 doses. If taking 3rd dose, call 911).   pramipexole  (MIRAPEX ) 0.5 MG tablet TAKE 1 TABLET(0.5 MG) BY MOUTH THREE TIMES DAILY     Allergies  Allergen Reactions   Rosuvastatin      Joint pain on 40mg  daily    Social History   Socioeconomic History   Marital status: Married    Spouse name: Not on file  Number of children: 3   Years of education: Not on file   Highest education level: Not on file  Occupational History    Comment: taxes and bookkeeping  Tobacco Use   Smoking status: Never   Smokeless tobacco: Never  Vaping Use   Vaping status: Never Used  Substance and Sexual Activity   Alcohol use: Never   Drug use: Never   Sexual activity: Not Currently  Other Topics Concern   Not on file  Social History Narrative   Right handed   Lives in a one story home    Social Drivers of Health   Financial Resource Strain: Not on file  Food Insecurity: No Food Insecurity (10/02/2023)   Hunger Vital Sign     Worried About Running Out of Food in the Last Year: Never true    Ran Out of Food in the Last Year: Never true  Transportation Needs: No Transportation Needs (10/02/2023)   PRAPARE - Administrator, Civil Service (Medical): No    Lack of Transportation (Non-Medical): No  Physical Activity: Not on file  Stress: Not on file  Social Connections: Not on file  Intimate Partner Violence: Not At Risk (10/02/2023)   Humiliation, Afraid, Rape, and Kick questionnaire    Fear of Current or Ex-Partner: No    Emotionally Abused: No    Physically Abused: No    Sexually Abused: No     Review of Systems: General: negative for chills, fever, night sweats or weight changes.  Cardiovascular: negative for chest pain, dyspnea on exertion, edema, orthopnea, palpitations, paroxysmal nocturnal dyspnea or shortness of breath Dermatological: negative for rash Respiratory: negative for cough or wheezing Urologic: negative for hematuria Abdominal: negative for nausea, vomiting, diarrhea, bright red blood per rectum, melena, or hematemesis Neurologic: negative for visual changes, syncope, or dizziness All other systems reviewed and are otherwise negative except as noted above.    Blood pressure 128/66, pulse 68, height 5' 2 (1.575 m), weight 205 lb (93 kg), SpO2 96%.  General appearance: alert and no distress Neck: no adenopathy, no carotid bruit, no JVD, supple, symmetrical, trachea midline, and thyroid  not enlarged, symmetric, no tenderness/mass/nodules Lungs: clear to auscultation bilaterally Heart: regular rate and rhythm, S1, S2 normal, no murmur, click, rub or gallop Extremities: extremities normal, atraumatic, no cyanosis or edema Pulses: 2+ and symmetric Skin: Skin color, texture, turgor normal. No rashes or lesions Neurologic: Grossly normal  EKG EKG Interpretation Date/Time:  Monday September 28 2024 13:57:29 EST Ventricular Rate:  68 PR Interval:  184 QRS Duration:  86 QT  Interval:  414 QTC Calculation: 440 R Axis:   96  Text Interpretation: Normal sinus rhythm Rightward axis Low voltage QRS When compared with ECG of 14-Nov-2022 05:33, No significant change was found Confirmed by Court Carrier [phone]) on 09/28/2024 2:00:31 PM    ASSESSMENT AND PLAN:   Coronary artery disease History of CAD status post non-STEMI 10/24/2010 with stenting of her AV groove circumflex by Dr. Burnard with a drug-eluting stent.  She had moderate but noncritical disease in her LAD and RCA.  She had a non-STEMI 11/13/2022 and had Performed Dr. Jordan revealing a patent AV groove stent with high-grade mid dominant RCA stenosis which he stented with a 3 mm x 30 mm long Synergy drug-eluting stent with excellent result.  She had normal LV function.  She has had no recurrent symptoms and remains on DAPT with aspirin  and clopidogrel .  Hyperlipidemia History of hyperlipidemia on high-dose statin therapy with  lipid profile performed 03/29/2023 revealing a total cholesterol 120, LDL 49 HDL 56.  Essential hypertension .  She is on hydrochlorothiazide , lisinopril  and metoprolol .     Dorn DOROTHA Lesches Carey [facility], [facility] 09/28/2024 2:07 PM

## 2024-09-28 NOTE — Assessment & Plan Note (Signed)
 History of hyperlipidemia on high-dose statin therapy with lipid profile performed 03/29/2023 revealing a total cholesterol 120, LDL 49 HDL 56.

## 2024-10-02 ENCOUNTER — Other Ambulatory Visit: Payer: Self-pay

## 2024-10-02 ENCOUNTER — Other Ambulatory Visit (HOSPITAL_COMMUNITY): Payer: Self-pay

## 2024-10-02 DIAGNOSIS — G20A1 Parkinson's disease without dyskinesia, without mention of fluctuations: Secondary | ICD-10-CM

## 2024-10-02 MED ORDER — PRAMIPEXOLE DIHYDROCHLORIDE 0.5 MG PO TABS
0.5000 mg | ORAL_TABLET | Freq: Three times a day (TID) | ORAL | 0 refills | Status: AC
  Filled 2024-10-02 – 2024-10-16 (×2): qty 270, 90d supply, fill #0

## 2024-10-12 ENCOUNTER — Other Ambulatory Visit (HOSPITAL_COMMUNITY): Payer: Self-pay

## 2024-10-16 ENCOUNTER — Other Ambulatory Visit (HOSPITAL_COMMUNITY): Payer: Self-pay

## 2024-10-19 ENCOUNTER — Other Ambulatory Visit (HOSPITAL_COMMUNITY): Payer: Self-pay

## 2024-10-28 ENCOUNTER — Other Ambulatory Visit (HOSPITAL_COMMUNITY): Payer: Self-pay

## 2024-10-28 MED ORDER — METOPROLOL TARTRATE 25 MG PO TABS
25.0000 mg | ORAL_TABLET | Freq: Two times a day (BID) | ORAL | 1 refills | Status: AC
  Filled 2024-10-28: qty 180, 90d supply, fill #0

## 2024-10-28 MED ORDER — LORAZEPAM 0.5 MG PO TABS
0.2500 mg | ORAL_TABLET | Freq: Three times a day (TID) | ORAL | 0 refills | Status: AC | PRN
  Filled 2024-10-28: qty 30, 10d supply, fill #0

## 2024-11-16 ENCOUNTER — Other Ambulatory Visit (HOSPITAL_COMMUNITY): Payer: Self-pay

## 2024-11-18 ENCOUNTER — Other Ambulatory Visit (HOSPITAL_COMMUNITY): Payer: Self-pay

## 2024-11-18 MED ORDER — ATORVASTATIN CALCIUM 80 MG PO TABS
80.0000 mg | ORAL_TABLET | Freq: Every day | ORAL | 1 refills | Status: AC
  Filled 2024-11-18: qty 90, 90d supply, fill #0

## 2024-12-03 ENCOUNTER — Ambulatory Visit: Admitting: Neurology

## 2024-12-29 ENCOUNTER — Ambulatory Visit: Admitting: Neurology
